# Patient Record
Sex: Female | Born: 1945 | Race: White | Hispanic: No | Smoking: Former smoker
Health system: Southern US, Community
[De-identification: ages and names within clinical notes are randomized; demographics above are authoritative.]

## PROBLEM LIST (undated history)

## (undated) DIAGNOSIS — R0600 Dyspnea, unspecified: Secondary | ICD-10-CM

## (undated) DIAGNOSIS — J449 Chronic obstructive pulmonary disease, unspecified: Secondary | ICD-10-CM

## (undated) DIAGNOSIS — Z87442 Personal history of urinary calculi: Secondary | ICD-10-CM

## (undated) DIAGNOSIS — D126 Benign neoplasm of colon, unspecified: Secondary | ICD-10-CM

## (undated) DIAGNOSIS — K635 Polyp of colon: Secondary | ICD-10-CM

## (undated) DIAGNOSIS — J45909 Unspecified asthma, uncomplicated: Secondary | ICD-10-CM

## (undated) DIAGNOSIS — N189 Chronic kidney disease, unspecified: Secondary | ICD-10-CM

## (undated) DIAGNOSIS — R931 Abnormal findings on diagnostic imaging of heart and coronary circulation: Secondary | ICD-10-CM

## (undated) DIAGNOSIS — K222 Esophageal obstruction: Secondary | ICD-10-CM

## (undated) DIAGNOSIS — K5790 Diverticulosis of intestine, part unspecified, without perforation or abscess without bleeding: Secondary | ICD-10-CM

## (undated) DIAGNOSIS — H269 Unspecified cataract: Secondary | ICD-10-CM

## (undated) DIAGNOSIS — Z8489 Family history of other specified conditions: Secondary | ICD-10-CM

## (undated) DIAGNOSIS — K219 Gastro-esophageal reflux disease without esophagitis: Secondary | ICD-10-CM

## (undated) DIAGNOSIS — M1990 Unspecified osteoarthritis, unspecified site: Secondary | ICD-10-CM

## (undated) DIAGNOSIS — K449 Diaphragmatic hernia without obstruction or gangrene: Secondary | ICD-10-CM

## (undated) DIAGNOSIS — K589 Irritable bowel syndrome without diarrhea: Secondary | ICD-10-CM

## (undated) DIAGNOSIS — T8859XA Other complications of anesthesia, initial encounter: Secondary | ICD-10-CM

## (undated) DIAGNOSIS — E079 Disorder of thyroid, unspecified: Secondary | ICD-10-CM

## (undated) DIAGNOSIS — Z9889 Other specified postprocedural states: Secondary | ICD-10-CM

## (undated) DIAGNOSIS — K649 Unspecified hemorrhoids: Secondary | ICD-10-CM

## (undated) DIAGNOSIS — D649 Anemia, unspecified: Secondary | ICD-10-CM

## (undated) DIAGNOSIS — R112 Nausea with vomiting, unspecified: Secondary | ICD-10-CM

## (undated) DIAGNOSIS — I6529 Occlusion and stenosis of unspecified carotid artery: Secondary | ICD-10-CM

## (undated) DIAGNOSIS — I639 Cerebral infarction, unspecified: Secondary | ICD-10-CM

## (undated) DIAGNOSIS — E039 Hypothyroidism, unspecified: Secondary | ICD-10-CM

## (undated) DIAGNOSIS — I10 Essential (primary) hypertension: Secondary | ICD-10-CM

## (undated) DIAGNOSIS — T7840XA Allergy, unspecified, initial encounter: Secondary | ICD-10-CM

## (undated) DIAGNOSIS — E7800 Pure hypercholesterolemia, unspecified: Secondary | ICD-10-CM

## (undated) HISTORY — DX: Unspecified hemorrhoids: K64.9

## (undated) HISTORY — DX: Unspecified cataract: H26.9

## (undated) HISTORY — DX: Irritable bowel syndrome, unspecified: K58.9

## (undated) HISTORY — DX: Anemia, unspecified: D64.9

## (undated) HISTORY — PX: PARTIAL HYSTERECTOMY: SHX80

## (undated) HISTORY — DX: Essential (primary) hypertension: I10

## (undated) HISTORY — DX: Gastro-esophageal reflux disease without esophagitis: K21.9

## (undated) HISTORY — DX: Abnormal findings on diagnostic imaging of heart and coronary circulation: R93.1

## (undated) HISTORY — DX: Unspecified osteoarthritis, unspecified site: M19.90

## (undated) HISTORY — PX: CATARACT EXTRACTION, BILATERAL: SHX1313

## (undated) HISTORY — DX: Cerebral infarction, unspecified: I63.9

## (undated) HISTORY — PX: RHINOPLASTY: SUR1284

## (undated) HISTORY — DX: Occlusion and stenosis of unspecified carotid artery: I65.29

## (undated) HISTORY — DX: Benign neoplasm of colon, unspecified: D12.6

## (undated) HISTORY — DX: Allergy, unspecified, initial encounter: T78.40XA

## (undated) HISTORY — DX: Diverticulosis of intestine, part unspecified, without perforation or abscess without bleeding: K57.90

## (undated) HISTORY — DX: Esophageal obstruction: K22.2

## (undated) HISTORY — DX: Diaphragmatic hernia without obstruction or gangrene: K44.9

## (undated) HISTORY — PX: OTHER SURGICAL HISTORY: SHX169

## (undated) HISTORY — PX: ABDOMINAL HYSTERECTOMY: SHX81

## (undated) HISTORY — DX: Polyp of colon: K63.5

## (undated) HISTORY — DX: Hypothyroidism, unspecified: E03.9

## (undated) HISTORY — DX: Chronic kidney disease, unspecified: N18.9

## (undated) HISTORY — DX: Chronic obstructive pulmonary disease, unspecified: J44.9

---

## 1978-11-16 HISTORY — PX: CHOLECYSTECTOMY: SHX55

## 1999-04-09 ENCOUNTER — Other Ambulatory Visit: Admission: RE | Admit: 1999-04-09 | Discharge: 1999-04-09 | Payer: Self-pay | Admitting: Internal Medicine

## 1999-05-16 ENCOUNTER — Ambulatory Visit (HOSPITAL_COMMUNITY): Admission: RE | Admit: 1999-05-16 | Discharge: 1999-05-16 | Payer: Self-pay | Admitting: Internal Medicine

## 1999-05-16 ENCOUNTER — Encounter: Payer: Self-pay | Admitting: Internal Medicine

## 2000-10-12 ENCOUNTER — Ambulatory Visit (HOSPITAL_COMMUNITY): Admission: RE | Admit: 2000-10-12 | Discharge: 2000-10-12 | Payer: Self-pay | Admitting: Critical Care Medicine

## 2000-10-12 ENCOUNTER — Encounter: Payer: Self-pay | Admitting: Critical Care Medicine

## 2001-09-27 ENCOUNTER — Encounter: Admission: RE | Admit: 2001-09-27 | Discharge: 2001-09-27 | Payer: Self-pay | Admitting: *Deleted

## 2001-09-27 ENCOUNTER — Encounter: Payer: Self-pay | Admitting: *Deleted

## 2001-09-29 ENCOUNTER — Ambulatory Visit (HOSPITAL_BASED_OUTPATIENT_CLINIC_OR_DEPARTMENT_OTHER): Admission: RE | Admit: 2001-09-29 | Discharge: 2001-09-30 | Payer: Self-pay | Admitting: *Deleted

## 2001-12-01 ENCOUNTER — Encounter: Admission: RE | Admit: 2001-12-01 | Discharge: 2001-12-01 | Payer: Self-pay

## 2002-01-18 ENCOUNTER — Other Ambulatory Visit: Admission: RE | Admit: 2002-01-18 | Discharge: 2002-01-18 | Payer: Self-pay | Admitting: Obstetrics & Gynecology

## 2004-01-29 ENCOUNTER — Other Ambulatory Visit: Admission: RE | Admit: 2004-01-29 | Discharge: 2004-01-29 | Payer: Self-pay | Admitting: *Deleted

## 2004-02-01 ENCOUNTER — Encounter: Admission: RE | Admit: 2004-02-01 | Discharge: 2004-02-01 | Payer: Self-pay | Admitting: Family Medicine

## 2004-02-13 ENCOUNTER — Encounter: Admission: RE | Admit: 2004-02-13 | Discharge: 2004-05-13 | Payer: Self-pay | Admitting: Family Medicine

## 2007-12-09 ENCOUNTER — Encounter: Admission: RE | Admit: 2007-12-09 | Discharge: 2007-12-09 | Payer: Self-pay | Admitting: Gastroenterology

## 2010-06-04 ENCOUNTER — Encounter: Admission: RE | Admit: 2010-06-04 | Discharge: 2010-06-04 | Payer: Self-pay | Admitting: Allergy and Immunology

## 2011-01-02 ENCOUNTER — Emergency Department (HOSPITAL_COMMUNITY)
Admission: EM | Admit: 2011-01-02 | Discharge: 2011-01-02 | Disposition: A | Discharge status: Home / Self Care | Payer: Medicare Other | Attending: Emergency Medicine | Admitting: Emergency Medicine

## 2011-01-02 DIAGNOSIS — M79609 Pain in unspecified limb: Secondary | ICD-10-CM | POA: Insufficient documentation

## 2011-01-02 DIAGNOSIS — E7800 Pure hypercholesterolemia, unspecified: Secondary | ICD-10-CM | POA: Insufficient documentation

## 2011-01-02 DIAGNOSIS — E119 Type 2 diabetes mellitus without complications: Secondary | ICD-10-CM | POA: Insufficient documentation

## 2011-01-02 DIAGNOSIS — R61 Generalized hyperhidrosis: Secondary | ICD-10-CM | POA: Insufficient documentation

## 2011-01-02 DIAGNOSIS — R0989 Other specified symptoms and signs involving the circulatory and respiratory systems: Secondary | ICD-10-CM | POA: Insufficient documentation

## 2011-01-02 DIAGNOSIS — R0609 Other forms of dyspnea: Secondary | ICD-10-CM | POA: Insufficient documentation

## 2011-01-02 DIAGNOSIS — I10 Essential (primary) hypertension: Secondary | ICD-10-CM | POA: Insufficient documentation

## 2011-01-02 DIAGNOSIS — R0789 Other chest pain: Secondary | ICD-10-CM | POA: Insufficient documentation

## 2011-01-02 DIAGNOSIS — Z79899 Other long term (current) drug therapy: Secondary | ICD-10-CM | POA: Insufficient documentation

## 2011-01-02 LAB — COMPREHENSIVE METABOLIC PANEL
Albumin: 4.1 g/dL (ref 3.5–5.2)
Alkaline Phosphatase: 56 U/L (ref 39–117)
BUN: 12 mg/dL (ref 6–23)
Chloride: 105 mEq/L (ref 96–112)
Glucose, Bld: 271 mg/dL — ABNORMAL HIGH (ref 70–99)
Potassium: 3.6 mEq/L (ref 3.5–5.1)
Total Bilirubin: 0.6 mg/dL (ref 0.3–1.2)

## 2011-01-02 LAB — POCT CARDIAC MARKERS: Myoglobin, poc: 68.7 ng/mL (ref 12–200)

## 2011-01-02 LAB — CBC
HCT: 39 % (ref 36.0–46.0)
Hemoglobin: 13.6 g/dL (ref 12.0–15.0)
MCV: 86.7 fL (ref 78.0–100.0)
RBC: 4.5 MIL/uL (ref 3.87–5.11)
WBC: 6.4 10*3/uL (ref 4.0–10.5)

## 2011-04-16 ENCOUNTER — Other Ambulatory Visit: Payer: Self-pay | Admitting: Obstetrics and Gynecology

## 2012-06-14 ENCOUNTER — Emergency Department (HOSPITAL_COMMUNITY): Payer: Medicare Other

## 2012-06-14 ENCOUNTER — Emergency Department (HOSPITAL_COMMUNITY)
Admission: EM | Admit: 2012-06-14 | Discharge: 2012-06-14 | Disposition: A | Discharge status: Home / Self Care | Payer: Medicare Other | Attending: Emergency Medicine | Admitting: Emergency Medicine

## 2012-06-14 ENCOUNTER — Encounter (HOSPITAL_COMMUNITY): Payer: Self-pay

## 2012-06-14 DIAGNOSIS — Z043 Encounter for examination and observation following other accident: Secondary | ICD-10-CM | POA: Insufficient documentation

## 2012-06-14 DIAGNOSIS — R079 Chest pain, unspecified: Secondary | ICD-10-CM

## 2012-06-14 DIAGNOSIS — E7800 Pure hypercholesterolemia, unspecified: Secondary | ICD-10-CM | POA: Insufficient documentation

## 2012-06-14 DIAGNOSIS — J45909 Unspecified asthma, uncomplicated: Secondary | ICD-10-CM | POA: Insufficient documentation

## 2012-06-14 DIAGNOSIS — E079 Disorder of thyroid, unspecified: Secondary | ICD-10-CM | POA: Insufficient documentation

## 2012-06-14 HISTORY — DX: Pure hypercholesterolemia, unspecified: E78.00

## 2012-06-14 HISTORY — DX: Disorder of thyroid, unspecified: E07.9

## 2012-06-14 HISTORY — DX: Unspecified asthma, uncomplicated: J45.909

## 2012-06-14 NOTE — ED Notes (Signed)
Pt arrived via [facility] on [facility] with c-collar intact 
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
## 2012-06-15 NOTE — ED Provider Notes (Signed)
I saw and evaluated the patient, reviewed the resident's note and I agree with the findings and plan.   Dione Booze, MD 06/15/12 2312

## 2012-10-16 HISTORY — PX: BUNIONECTOMY: SHX129

## 2013-03-21 ENCOUNTER — Other Ambulatory Visit: Payer: Self-pay | Admitting: Gastroenterology

## 2013-04-12 ENCOUNTER — Other Ambulatory Visit: Payer: Self-pay | Admitting: Gastroenterology

## 2013-04-12 DIAGNOSIS — R1310 Dysphagia, unspecified: Secondary | ICD-10-CM

## 2013-05-03 ENCOUNTER — Other Ambulatory Visit: Payer: Medicare Other

## 2014-06-01 ENCOUNTER — Ambulatory Visit (INDEPENDENT_AMBULATORY_CARE_PROVIDER_SITE_OTHER): Payer: 59 | Admitting: Podiatrist

## 2014-06-01 ENCOUNTER — Encounter: Payer: Self-pay | Admitting: Podiatrist

## 2014-06-01 ENCOUNTER — Ambulatory Visit (INDEPENDENT_AMBULATORY_CARE_PROVIDER_SITE_OTHER): Payer: 59

## 2014-06-01 VITALS — BP 124/58 | HR 88 | Resp 18

## 2014-06-01 DIAGNOSIS — S92919A Unspecified fracture of unspecified toe(s), initial encounter for closed fracture: Secondary | ICD-10-CM

## 2014-06-01 DIAGNOSIS — R52 Pain, unspecified: Secondary | ICD-10-CM

## 2014-06-01 DIAGNOSIS — S90222A Contusion of left lesser toe(s) with damage to nail, initial encounter: Secondary | ICD-10-CM

## 2014-06-01 DIAGNOSIS — L03039 Cellulitis of unspecified toe: Secondary | ICD-10-CM

## 2014-06-01 DIAGNOSIS — S92912A Unspecified fracture of left toe(s), initial encounter for closed fracture: Secondary | ICD-10-CM

## 2014-06-01 DIAGNOSIS — S90129A Contusion of unspecified lesser toe(s) without damage to nail, initial encounter: Secondary | ICD-10-CM

## 2014-06-01 NOTE — Patient Instructions (Signed)
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
## 2014-11-26 ENCOUNTER — Ambulatory Visit (INDEPENDENT_AMBULATORY_CARE_PROVIDER_SITE_OTHER): Payer: Medicare Other

## 2014-11-26 ENCOUNTER — Encounter: Payer: Self-pay | Admitting: Podiatry

## 2014-11-26 ENCOUNTER — Ambulatory Visit (INDEPENDENT_AMBULATORY_CARE_PROVIDER_SITE_OTHER): Payer: Medicare Other | Admitting: Podiatry

## 2014-11-26 ENCOUNTER — Other Ambulatory Visit: Payer: Self-pay | Admitting: Podiatry

## 2014-11-26 VITALS — BP 153/78 | HR 80 | Resp 14

## 2014-11-26 DIAGNOSIS — S99922A Unspecified injury of left foot, initial encounter: Secondary | ICD-10-CM

## 2014-11-26 DIAGNOSIS — S99912A Unspecified injury of left ankle, initial encounter: Secondary | ICD-10-CM

## 2014-11-26 DIAGNOSIS — S92352A Displaced fracture of fifth metatarsal bone, left foot, initial encounter for closed fracture: Secondary | ICD-10-CM

## 2014-11-26 NOTE — Progress Notes (Signed)
I was walking on an uneven sidewalk I stepped in a dip and turned my foot over trying not to fall  DOI 11/17/14

## 2014-11-26 NOTE — Patient Instructions (Signed)
Wear the boot on your left leg when walking and standing Remove boot 2-3 times a day in a seated position and move foot up and down 2-3 minutes

## 2014-11-27 ENCOUNTER — Encounter: Payer: Self-pay | Admitting: Podiatry
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
## 2014-12-24 ENCOUNTER — Ambulatory Visit (INDEPENDENT_AMBULATORY_CARE_PROVIDER_SITE_OTHER): Payer: Medicare Other

## 2014-12-24 ENCOUNTER — Ambulatory Visit (INDEPENDENT_AMBULATORY_CARE_PROVIDER_SITE_OTHER): Payer: Medicare Other | Admitting: Podiatry

## 2014-12-24 ENCOUNTER — Encounter: Payer: Self-pay | Admitting: Podiatry

## 2014-12-24 VITALS — BP 119/68 | HR 79 | Resp 16

## 2014-12-24 DIAGNOSIS — B351 Tinea unguium: Secondary | ICD-10-CM

## 2014-12-24 DIAGNOSIS — S92352D Displaced fracture of fifth metatarsal bone, left foot, subsequent encounter for fracture with routine healing: Secondary | ICD-10-CM

## 2014-12-24 NOTE — Patient Instructions (Signed)
Continue wearing boot on left foot/ankle when walking and standing

## 2014-12-25 NOTE — Progress Notes (Signed)
Patient ID: Kelly Mooney, female   DOB: 1946-04-27, 69 y.o.   MRN: [id number]  Subjective: This patient presents for follow-up care for Jones fracture left with Cam Walker immobilization since the visit of 11/26/2014. She says she wearing the boot on a regular basis and is having reducing discomfort in the fracture site  Objective: There is no erythema edema noted in the dorsal lateral aspect the fifth left foot. There is palpable tenderness and discomfort without crepitus elicited in the base of fifth left metatarsal area.  X-ray examination left foot  History of injury on 11/17/2014  Patient wearing Cam Walker boot since 11/27/2014  Fracture line noticed base of fifth metatarsal without bone callus or displacement  Radiographic impression: Jones fracture left foot without callus formation noted   Assessment: Jones fracture left without radiographic evidence of callus formation or consolidation  Plan: Continue wearing Cam Walker boot on left foot Discuss with patient findings of x-ray today make him aware that the fracture line was still visible.  Reappoint 4 weeks for progress x-ray

## 2015-01-21 ENCOUNTER — Encounter: Payer: Self-pay | Admitting: Podiatry

## 2015-01-21 ENCOUNTER — Ambulatory Visit (INDEPENDENT_AMBULATORY_CARE_PROVIDER_SITE_OTHER): Payer: Medicare Other

## 2015-01-21 ENCOUNTER — Ambulatory Visit (INDEPENDENT_AMBULATORY_CARE_PROVIDER_SITE_OTHER): Payer: Medicare Other | Admitting: Podiatry

## 2015-01-21 VITALS — BP 138/73 | HR 84

## 2015-01-21 DIAGNOSIS — M79672 Pain in left foot: Secondary | ICD-10-CM

## 2015-01-21 DIAGNOSIS — S92352D Displaced fracture of fifth metatarsal bone, left foot, subsequent encounter for fracture with routine healing: Secondary | ICD-10-CM

## 2015-01-21 NOTE — Patient Instructions (Signed)
Discontinue wearing the boot on the left foot/ankle unless the pain increases in or around the fracture site Where your existing surgical shoe on your left foot on a continuous daily basis when walking and standing Reappoint 4 weeks

## 2015-01-22 NOTE — Progress Notes (Signed)
Patient ID: Kelly Mooney, female   DOB: Feb 10, 1946, 69 y.o.   MRN: [id number]  Subjective: Patient presents for follow-up care for Jones fracture left foot under our care since 11/26/2014, wearing a Cam Walker boot on the left leg/foot. Patient has noticed no discomfort in fracture site for the past 2 weeks. Patient complaining of back/ hip pain since she's wearing the Cam Walker boot and visited chiropractor for treatment  There is no change in patient's health history other than back/hip pain as described above  Objective: Left foot demonstrates no edema or skin lesions Palpation on fracture site base of fifth left metatarsal elicits no tenderness, without crepitus,  X-ray examination left foot  History of Jones fracture occurring approximately 11/26/2014 Fracture base fifth left metatarsal with some beginning consolidation, without displacement  Radiographic impression: Beginning consolidation of Jones fracture left foot  Assessment: Beginning consolidation of Jones fracture left  Plan: DC Cam Walker boot left Patient advised to wear existing surgical shoe on left foot Minimize standing walking left  Reevaluate 4 weeks

## 2015-02-07 ENCOUNTER — Telehealth: Payer: Self-pay | Admitting: *Deleted

## 2015-02-07 ENCOUNTER — Encounter: Payer: Self-pay | Admitting: Podiatry

## 2015-02-07 NOTE — Telephone Encounter (Addendum)
I left patient a message to return my call.  I need to inform her that her that Dr. Amalia Hailey said culture results came back negative for fungus.  If she wants something to treat it, can prescribe Urea.  "I'm returning your call."

## 2015-02-14 NOTE — Telephone Encounter (Signed)
I left patient a message to call me back.  Work phone number is incorrect, patient is no longer employed there.

## 2015-02-18 ENCOUNTER — Ambulatory Visit: Payer: Medicare Other | Admitting: Podiatry

## 2015-04-09 ENCOUNTER — Other Ambulatory Visit: Payer: Self-pay

## 2015-04-09 ENCOUNTER — Telehealth: Payer: Self-pay | Admitting: Internal Medicine

## 2015-04-09 NOTE — Telephone Encounter (Signed)
Pt states she has been having problems with diarrhea and IBS for years and she is tired of this and wants to be seen to find out what can be done. Pt scheduled to see Dr. Henrene Pastor 06/12/15@1 :45pm. Pt aware of appt.

## 2015-04-25 ENCOUNTER — Other Ambulatory Visit: Payer: Self-pay | Admitting: Allergy and Immunology

## 2015-04-25 ENCOUNTER — Ambulatory Visit
Admission: RE | Admit: 2015-04-25 | Discharge: 2015-04-25 | Disposition: A | Discharge status: Home / Self Care | Payer: Medicare Other | Source: Ambulatory Visit | Attending: Allergy and Immunology | Admitting: Allergy and Immunology

## 2015-04-25 DIAGNOSIS — R0602 Shortness of breath: Secondary | ICD-10-CM

## 2015-06-12 ENCOUNTER — Ambulatory Visit (INDEPENDENT_AMBULATORY_CARE_PROVIDER_SITE_OTHER): Payer: Medicare Other | Admitting: Internal Medicine

## 2015-06-12 ENCOUNTER — Encounter: Payer: Self-pay | Admitting: Internal Medicine

## 2015-06-12 VITALS — BP 122/70 | HR 72 | Ht 61.0 in | Wt 169.0 lb

## 2015-06-12 DIAGNOSIS — K219 Gastro-esophageal reflux disease without esophagitis: Secondary | ICD-10-CM | POA: Diagnosis not present

## 2015-06-12 DIAGNOSIS — R197 Diarrhea, unspecified: Secondary | ICD-10-CM

## 2015-06-12 DIAGNOSIS — K449 Diaphragmatic hernia without obstruction or gangrene: Secondary | ICD-10-CM

## 2015-06-12 DIAGNOSIS — R1310 Dysphagia, unspecified: Secondary | ICD-10-CM

## 2015-06-12 DIAGNOSIS — R159 Full incontinence of feces: Secondary | ICD-10-CM

## 2015-06-12 MED ORDER — CHOLESTYRAMINE 4 G PO PACK: PACK | ORAL | Status: DC

## 2015-06-12 MED ORDER — OMEPRAZOLE 40 MG PO CPDR: 40.0000 mg | DELAYED_RELEASE_CAPSULE | Freq: Every day | ORAL | Status: DC

## 2015-06-12 NOTE — Progress Notes (Signed)
HISTORY OF PRESENT ILLNESS:  Kelly Mooney is a pleasant 69 y.o. female with diabetes mellitus, hyperlipidemia, hypothyroidism, asthma, and GERD. She is status post remote cholecystectomy. She is self-referred at the urging of her husband, who accompanies her, regarding multiple chronic GI complaints. The patient was seen here many years ago (records unavailable) but has received recent GI care elsewhere from Dr. Laurence Spates. They presented to this office today being unsatisfied with the management of her chronic GI complaints. Her problems are as follows: (#1). Diarrhea. Greater than 10 year history of daily diarrhea which has increased in frequency in recent years. Her problem is exacerbated by meals. Generally has 7-8 loose bowel movements per day. Rarely has a formed bowel movement. Bowel movements generally sink, but she has noticed oily appearance at times. No blood. No nocturnal component. No change with stress. There is associated bloating. She has tried Imodium about once per month. This helps. She is reluctant to use it more regularly. Review of the electronic medical record shows that she underwent random colon biopsies (during colonoscopy with Dr. Oletta Lamas) 03/21/2013. These were normal. As well, normal small bowel biopsies without villous atrophy (upper endoscopy with Dr. Oletta Lamas) on that same day. She denies being given a specific diagnosis for her diarrhea. States that she was only treated with "a little pill" which did not work and was not tolerated. She had been on metformin until about 9 months ago. After its discontinuance, no change in bowel habits. She is also on amlodipine. (#2). Fecal incontinence. This has occurred with increasing frequency over time. Currently on a daily basis. Generally occurs after meal-induced urgency to defecate. She does not wear protective undergarments. (#3). Dysphagia. Solids greater than liquids. Has been going on for greater than 10 years. Apparently had  esophageal dilation remotely without improvement. Review of electronic medical record shows a barium swallow performed January 2009. She is said to have a moderately large hiatal hernia with sliding and paraesophageal components as well as reflux. No obvious stricture or delay in passage of 13 mm barium tablet. The patient states she was not advised with regards to these findings. Problems with dysphagia continue. (#4). GERD. Patient reports chronic GERD with pyrosis. Worse at night. This despite omeprazole 20 mg daily. [employer] daily. There has been no weight loss, melena, or rectal bleeding.  REVIEW OF SYSTEMS:  All non-GI ROS negative upon review of all systems today  Past Medical History  Diagnosis Date  . Diabetes mellitus   . Hypercholesteremia   . Asthma   . Thyroid disease   . GERD (gastroesophageal reflux disease)     Past Surgical History  Procedure Laterality Date  . Rhinoplasty      nasal polyps removed dr. Barrie Folk  . Cholecystectomy  1980  . Partail hysterectomy    . 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 She has never used smokeless tobacco. She reports that she does not drink alcohol or use illicit drugs.  family history includes Asthma in her brother and mother; Diabetes Mellitus I in her mother; Heart attack in her brother and father; Hyperlipidemia in her mother.  Allergies  Allergen Reactions  . Iodine Itching  . Ivp Dye [Iodinated Diagnostic Agents] Other (See Comments)    unknown       PHYSICAL EXAMINATION: Vital signs: BP 122/70 mmHg  Pulse 72  Ht 5\' 1"  (1.549 m)  Wt 169 lb (  76.658 kg)  BMI 31.95 kg/m2  Constitutional: generally well-appearing, no acute distress Psychiatric: alert and oriented x3, cooperative Eyes: extraocular movements intact, anicteric, conjunctiva pink Mouth: oral pharynx moist, no lesions Neck: supple no lymphadenopathy Cardiovascular: heart regular rate  and rhythm, no murmur Lungs: clear to auscultation bilaterally Abdomen: soft, obese, nontender, nondistended, no obvious ascites, no peritoneal signs, normal bowel sounds, no organomegaly Rectal: Sensation intact. No external abnormalities. Internal hemorrhoids. Good sphincter tone. No mass or significant tenderness. Hemoccult-negative stool Extremities: no clubbing cyanosis or lower extremity edema bilaterally Skin: no lesions on visible extremities Neuro: No focal deficits. No asterixis.    ASSESSMENT:  #1. Chronic diarrhea. Possible etiologies include IBS, bile salt induced, pancreatic insufficiency. #2. Fecal incontinence. Good rectal tone #3. GERD. Significant symptoms despite current dose of omeprazole. #4. Chronic dysphagia as described. May be motility disorder or mechanical and related to her hernia. Needs assessed #5. 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 Should be reassessed #6. Multiple general medical problems including diabetes  PLAN:  #1. Increased dose of omeprazole. Prescribed omeprazole 40 mg daily with multiple refills #2. Reflux precautions. Advised #3. Fecal elastase #4. Prescribe Questran 4 g twice a day. Advised not to take within 2 hours of other medications #5. Okay to use Imodium more liberally #6. Schedule upper GI series to reevaluate dysphagia and paraesophageal hernia #7. Schedule esophageal manometry as this patient may benefit from hernia repair and fundoplication #8. Office follow-up in 4-6 weeks after the above completed #9. Have requested outside GI records for review and incorporation into her medical record here at [facility]   A copy has been sent to her PCP Dr. Harrington Challenger

## 2015-06-12 NOTE — Patient Instructions (Addendum)
Your physician has requested that you go to the basement for the following lab work before leaving today:  Fecal elastace  We have sent the following medications to your pharmacy for you to pick up at your convenience:  Omeprazole 40mg , Questran   Please follow up with Dr. Henrene Pastor on 07/30/2015 at 9:15am  You have been scheduled for an esophageal manometry on 07/15/2015 at 8;30am

## 2015-06-13 ENCOUNTER — Other Ambulatory Visit: Payer: Medicare Other

## 2015-06-13 DIAGNOSIS — K219 Gastro-esophageal reflux disease without esophagitis: Secondary | ICD-10-CM

## 2015-06-13 DIAGNOSIS — R159 Full incontinence of feces: Secondary | ICD-10-CM

## 2015-06-13 DIAGNOSIS — R1310 Dysphagia, unspecified: Secondary | ICD-10-CM

## 2015-06-13 DIAGNOSIS — R197 Diarrhea, unspecified: Secondary | ICD-10-CM

## 2015-06-17 ENCOUNTER — Telehealth: Payer: Self-pay

## 2015-06-17 DIAGNOSIS — R1310 Dysphagia, unspecified: Secondary | ICD-10-CM

## 2015-06-17 NOTE — Telephone Encounter (Signed)
Left message for patient to call back to schedule upper GI series ordered by Dr. Henrene Pastor

## 2015-06-18 NOTE — Telephone Encounter (Signed)
Spoke with patient to let her know Dr. Henrene Pastor wanted her to have an upper GI series.  Scheduled this test for 06/25/2015 at  10:00am, arrive 9:45am, npo hours.  Patient acknowledged and understood

## 2015-06-22 LAB — PANCREATIC ELASTASE, FECAL: PANCREATIC ELASTASE-1, STL: 415 ug/g

## 2015-06-25 ENCOUNTER — Ambulatory Visit (HOSPITAL_COMMUNITY)
Admission: RE | Admit: 2015-06-25 | Discharge: 2015-06-25 | Disposition: A | Discharge status: Home / Self Care | Payer: Medicare Other | Source: Ambulatory Visit | Attending: Internal Medicine | Admitting: Internal Medicine

## 2015-06-25 DIAGNOSIS — K224 Dyskinesia of esophagus: Secondary | ICD-10-CM | POA: Diagnosis not present

## 2015-06-25 DIAGNOSIS — R1310 Dysphagia, unspecified: Secondary | ICD-10-CM

## 2015-06-25 DIAGNOSIS — K219 Gastro-esophageal reflux disease without esophagitis: Secondary | ICD-10-CM | POA: Insufficient documentation

## 2015-06-25 DIAGNOSIS — K449 Diaphragmatic hernia without obstruction or gangrene: Secondary | ICD-10-CM | POA: Insufficient documentation

## 2015-06-25 DIAGNOSIS — K222 Esophageal obstruction: Secondary | ICD-10-CM | POA: Insufficient documentation

## 2015-07-15 ENCOUNTER — Encounter (HOSPITAL_COMMUNITY)
Admission: RE | Disposition: A | Discharge status: Home / Self Care | Payer: Self-pay | Source: Ambulatory Visit | Attending: Internal Medicine

## 2015-07-15 ENCOUNTER — Ambulatory Visit (HOSPITAL_COMMUNITY)
Admission: RE | Admit: 2015-07-15 | Discharge: 2015-07-15 | Disposition: A | Discharge status: Home / Self Care | Payer: Medicare Other | Source: Ambulatory Visit | Attending: Internal Medicine | Admitting: Internal Medicine

## 2015-07-15 DIAGNOSIS — R1310 Dysphagia, unspecified: Secondary | ICD-10-CM | POA: Diagnosis not present

## 2015-07-15 HISTORY — PX: ESOPHAGEAL MANOMETRY: SHX5429

## 2015-07-15 SURGERY — MANOMETRY, ESOPHAGUS

## 2015-07-15 MED ORDER — LIDOCAINE VISCOUS 2 % MT SOLN
OROMUCOSAL | Status: AC
  Filled 2015-07-15: qty 15

## 2015-07-15 SURGICAL SUPPLY — 2 items
FACESHIELD LNG OPTICON STERILE (SAFETY) IMPLANT
GLOVE BIO SURGEON STRL SZ8 (GLOVE) ×4 IMPLANT

## 2015-07-16 ENCOUNTER — Encounter (HOSPITAL_COMMUNITY): Payer: Self-pay | Admitting: Internal Medicine

## 2015-07-19 ENCOUNTER — Ambulatory Visit (AMBULATORY_SURGERY_CENTER): Payer: Self-pay

## 2015-07-19 VITALS — Ht 63.0 in | Wt 171.8 lb

## 2015-07-19 DIAGNOSIS — R1314 Dysphagia, pharyngoesophageal phase: Secondary | ICD-10-CM

## 2015-07-19 NOTE — Progress Notes (Signed)
No allergies to eggs or soy No diet/weight loss meds No home oxygen No past problems with anesthesia except PONV  Refused  emmi

## 2015-07-25 ENCOUNTER — Encounter: Payer: Self-pay | Admitting: Internal Medicine

## 2015-07-29 ENCOUNTER — Encounter: Payer: Self-pay | Admitting: Internal Medicine

## 2015-07-29 ENCOUNTER — Ambulatory Visit (AMBULATORY_SURGERY_CENTER): Payer: Medicare Other | Admitting: Internal Medicine

## 2015-07-29 VITALS — BP 110/66 | HR 73 | Temp 97.5°F | Resp 15 | Ht 63.0 in | Wt 171.0 lb

## 2015-07-29 DIAGNOSIS — R1314 Dysphagia, pharyngoesophageal phase: Secondary | ICD-10-CM | POA: Diagnosis not present

## 2015-07-29 DIAGNOSIS — R1310 Dysphagia, unspecified: Secondary | ICD-10-CM

## 2015-07-29 DIAGNOSIS — K222 Esophageal obstruction: Secondary | ICD-10-CM | POA: Diagnosis not present

## 2015-07-29 DIAGNOSIS — K219 Gastro-esophageal reflux disease without esophagitis: Secondary | ICD-10-CM

## 2015-07-29 DIAGNOSIS — R1319 Other dysphagia: Secondary | ICD-10-CM

## 2015-07-29 LAB — GLUCOSE, CAPILLARY
GLUCOSE-CAPILLARY: 118 mg/dL — AB (ref 65–99)
Glucose-Capillary: 94 mg/dL (ref 65–99)

## 2015-07-29 MED ORDER — SODIUM CHLORIDE 0.9 % IV SOLN: 500.0000 mL | INTRAVENOUS | Status: DC

## 2015-07-29 NOTE — Patient Instructions (Signed)
Impressions/recommendations:  GERD (handout given) Hiatal hernia (handout given)  Dilation diet (handout given)  Continue current medications Call office in the next 2-3 days to schedule an appointment for 10 weeks.  YOU HAD AN ENDOSCOPIC PROCEDURE TODAY AT [facility]:   Refer to the procedure report that was given to you for any specific questions about what was found during the examination.  If the procedure report does not answer your questions, please call your gastroenterologist to clarify.  If you requested that your care partner not be given the details of your procedure findings, then the procedure report has been included in a sealed envelope for you to review at your convenience later.  YOU SHOULD EXPECT: Some feelings of bloating in the abdomen. Passage of more gas than usual.  Walking can help get rid of the air that was put into your GI tract during the procedure and reduce the bloating. If you had a lower endoscopy (such as a colonoscopy or flexible sigmoidoscopy) you may notice spotting of blood in your stool or on the toilet paper. If you underwent a bowel prep for your procedure, you may not have a normal bowel movement for a few days.  Please Note:  You might notice some irritation and congestion in your nose or some drainage.  This is from the oxygen used during your procedure.  There is no need for concern and it should clear up in a day or so.  SYMPTOMS TO REPORT IMMEDIATELY:   Following upper endoscopy (EGD)  Vomiting of blood or coffee ground material  New chest pain or pain under the shoulder blades  Painful or persistently difficult swallowing  New shortness of breath  Fever of 100F or higher  Black, tarry-looking stools  For urgent or emergent issues, a gastroenterologist can be reached at any hour by calling [phone].   DIET: Your first meal following the procedure should be a small meal and then it is ok to progress to your normal  diet. Heavy or fried foods are harder to digest and may make you feel nauseous or bloated.  Likewise, meals heavy in dairy and vegetables can increase bloating.  Drink plenty of fluids but you should avoid alcoholic beverages for 24 hours.  ACTIVITY:  You should plan to take it easy for the rest of today and you should NOT DRIVE or use heavy machinery until tomorrow (because of the sedation medicines used during the test).    FOLLOW UP: Our staff will call the number listed on your records the next business day following your procedure to check on you and address any questions or concerns that you may have regarding the information given to you following your procedure. If we do not reach you, we will leave a message.  However, if you are feeling well and you are not experiencing any problems, there is no need to return our call.  We will assume that you have returned to your regular daily activities without incident.  If any biopsies were taken you will be contacted by phone or by letter within the next 1-3 weeks.  Please call us at [phone] if you have not heard about the biopsies in 3 weeks.    SIGNATURES/CONFIDENTIALITY: You and/or your care partner have signed paperwork which will be entered into your electronic medical record.  These signatures attest to the fact that that the information above on your After Visit Summary has been reviewed and is understood.  Full responsibility of  the confidentiality of this discharge information lies with you and/or your care-partner. 

## 2015-07-29 NOTE — Progress Notes (Signed)
Called to room to assist during endoscopic procedure.  Patient ID and intended procedure confirmed with present staff. Received instructions for my participation in the procedure from the performing physician.  

## 2015-07-29 NOTE — Op Note (Signed)
[place]  [address] [place], [id number]   ENDOSCOPY PROCEDURE REPORT  PATIENT: Magie, Ciampa  MR#: [id number] BIRTHDATE: March 05, 1946 , 69  yrs. old GENDER: female ENDOSCOPIST: Eustace Quail, MD REFERRED BY:  .  Self / Office PROCEDURE DATE:  07/29/2015 PROCEDURE:  EGD w/ balloon dilation ASA CLASS:     Class II INDICATIONS:  dysphagia and abnormal UGI series results. MEDICATIONS: Monitored anesthesia care and Propofol 200 mg IV TOPICAL ANESTHETIC: none  DESCRIPTION OF PROCEDURE: After the risks benefits and alternatives of the procedure were thoroughly explained, informed consent was obtained.  The LB [id number] endoscope was introduced through the mouth and advanced to the second portion of the duodenum , Without limitations.  The instrument was slowly withdrawn as the mucosa was fully examined.  EXAM:Esophagus was tortuous.  The gastroesophageal junction was remarkable for ringlike stricture measuring 15 mm.  Stomach was remarkable for a 4-5 cm sliding hiatal hernia.  The remainder of stomach was normal.  The duodenum was normal. THERAPY: A TTS balloon was used to sequentially dilate the esophageal stricture at diameters of 18, 19, and 20 mm.  Moderate resistance.  No heme.  Tolerated well.  Retroflexed views revealed a hiatal hernia.     The scope was then withdrawn from the patient and the procedure completed.  COMPLICATIONS: There were no immediate complications.  ENDOSCOPIC IMPRESSION: 1. GERD 2. Ringlike distal stricture status post dilation 3. Hiatal hernia with paraesophageal component on barium radiology   RECOMMENDATIONS: 1.  Clear liquids until 4 PM, then soft foods rest of day.  Resume prior diet tomorrow. 2.  Continue current medications 3.  Call office next 2-3 days to schedule an office appointment for a 10 weeks  REPEAT EXAM:  eSigned:  Eustace Quail, MD 07/29/2015 2:46 PM    CC:The Patient and Lona Kettle  MD

## 2015-07-29 NOTE — Progress Notes (Signed)
To recovery, report to [place], RN, VSS 

## 2015-07-30 ENCOUNTER — Ambulatory Visit: Payer: Medicare Other | Admitting: Internal Medicine

## 2015-07-30 NOTE — Telephone Encounter (Deleted)
No answer. Left message to call if questions or concerns. 

## 2015-09-05 ENCOUNTER — Other Ambulatory Visit: Payer: Medicare Other

## 2015-09-19 ENCOUNTER — Ambulatory Visit (INDEPENDENT_AMBULATORY_CARE_PROVIDER_SITE_OTHER): Payer: Medicare Other | Admitting: Internal Medicine

## 2015-09-19 ENCOUNTER — Encounter: Payer: Self-pay | Admitting: Internal Medicine

## 2015-09-19 VITALS — BP 124/64 | HR 60 | Ht 61.0 in | Wt 173.2 lb

## 2015-09-19 DIAGNOSIS — R159 Full incontinence of feces: Secondary | ICD-10-CM

## 2015-09-19 DIAGNOSIS — K591 Functional diarrhea: Secondary | ICD-10-CM

## 2015-09-19 DIAGNOSIS — K449 Diaphragmatic hernia without obstruction or gangrene: Secondary | ICD-10-CM

## 2015-09-19 DIAGNOSIS — K219 Gastro-esophageal reflux disease without esophagitis: Secondary | ICD-10-CM | POA: Diagnosis not present

## 2015-09-19 DIAGNOSIS — K222 Esophageal obstruction: Secondary | ICD-10-CM

## 2015-09-19 DIAGNOSIS — R1310 Dysphagia, unspecified: Secondary | ICD-10-CM

## 2015-09-19 NOTE — Patient Instructions (Signed)
Call back to schedule a follow up in one year or sooner if needed.

## 2015-09-19 NOTE — Progress Notes (Signed)
HISTORY OF PRESENT ILLNESS:  Kelly Mooney is a 69 y.o. female with past medical history as listed below. She was initially evaluated 06/12/2015 regarding chronic diarrhea, fecal incontinence, GERD with dysphagia, and para esophageal hernia on previous imaging. Please See Dictation for details. We increased her omeprazole from 20 mg to 40 mg. Fecal elastase was checked and returned normal. Questran 4 g twice daily prescribed, permitted to use Imodium more liberally, underwent upper GI series which revealed a moderate sliding hiatal hernia with a small paraesophageal component, dysmotility, and stricture. Upper endoscopy was performed 07/29/2015. This revealed a distal esophageal stricture which was balloon dilated to 20 mm. She presents today for follow-up. Patient is extremely pleased to report that most of her initial problems have resolved or improved. First, the chronic diarrhea has significantly improved with Questran. She is only requiring this proximal me 3 times per week. Not requiring Imodium. Very little in the way of fecal incontinence. Minimal leakage only. Her reflux symptoms have resolved with higher dose PPI. Her issues with dysphagia have improved "95%" since her dilation. We also performed esophageal manometry should antireflux surgery or hernia repair surgery be required. This revealed intact peristalsis with no significant motility disorder found. No additional complaints or issues today. As previously reported, her last colonoscopy with normal random colon biopsies was with Dr. Oletta Lamas in May 2014  REVIEW OF SYSTEMS:  All non-GI ROS negative except for sinus allergy trouble  Past Medical History  Diagnosis Date  . Diabetes mellitus   . Hypercholesteremia   . Asthma   . Thyroid disease   . GERD (gastroesophageal reflux disease)   . Esophageal stricture   . Hiatal hernia   . Diverticulosis   . Colon polyps     Past Surgical History  Procedure Laterality Date  . Rhinoplasty       nasal polyps removed dr. Barrie Folk  . Cholecystectomy  1980  . Partail hysterectomy    . Bunionectomy  10/2012    right foot  . Esophageal manometry N/A 07/15/2015    Procedure: ESOPHAGEAL MANOMETRY (EM);  Surgeon: Irene Shipper, MD;  Location: [facility];  Service: Endoscopy;  Laterality: N/A;    Social History SHONITA RINCK  reports that she has never smoked. She has never used smokeless tobacco. She reports that she does not drink alcohol or use illicit drugs.  family history includes Asthma in her brother and mother; Diabetes Mellitus I in her mother; Heart attack in her brother and father; Hyperlipidemia in her mother. There is no history of Colon cancer or Colon polyps.  Allergies  Allergen Reactions  . Iodine Itching  . Ivp Dye [Iodinated Diagnostic Agents] Other (See Comments)    unknown       PHYSICAL EXAMINATION:  Vital signs: BP 124/64 mmHg  Pulse 60  Ht 5\' 1"  (1.549 m)  Wt 173 lb 4 oz (78.586 kg)  BMI 32.75 kg/m2 General: Well-developed, well-nourished, no acute distress HEENT: Sclerae are anicteric, conjunctiva pink. Oral mucosa intact Lungs: Clear Heart: Regular Abdomen: soft, nontender, nondistended, no obvious ascites, no peritoneal signs, normal bowel sounds. No organomegaly. Extremities: No clubbing cyanosis or edema Psychiatric: alert and oriented x3. Cooperative  ASSESSMENT:  #1. Chronic GERD. Exacerbation responded to increased dose of omeprazole #2. Chronic diarrhea and fecal incontinence improved with Questran #3. Dysphagia improved post dilation of esophageal stricture #4. Paraesophageal hernia component small. Plan to observe #5. Colonoscopy with biopsies (negative) with Dr. Oletta Lamas May 2014  PLAN:  #1. Reflux precautions.  Advised #2. Continue omeprazole at current dose #3. Questran as needed to control diarrhea #4. Recommend routine GI follow-up in 1 year. Sooner if needed for questions or problems  25 minutes was spent face-to-face  with the patient today. Greater than 50% of the time was used for counseling regarding her multiple GI diagnoses, treatment plan, and follow-up

## 2015-11-23 ENCOUNTER — Other Ambulatory Visit: Payer: Self-pay | Admitting: Internal Medicine

## 2016-04-16 ENCOUNTER — Ambulatory Visit (INDEPENDENT_AMBULATORY_CARE_PROVIDER_SITE_OTHER): Payer: Medicare Other | Admitting: Internal Medicine

## 2016-04-16 ENCOUNTER — Encounter: Payer: Self-pay | Admitting: Internal Medicine

## 2016-04-16 VITALS — BP 136/76 | HR 80 | Ht 61.5 in | Wt 172.2 lb

## 2016-04-16 DIAGNOSIS — R1310 Dysphagia, unspecified: Secondary | ICD-10-CM

## 2016-04-16 DIAGNOSIS — K591 Functional diarrhea: Secondary | ICD-10-CM

## 2016-04-16 DIAGNOSIS — K219 Gastro-esophageal reflux disease without esophagitis: Secondary | ICD-10-CM | POA: Diagnosis not present

## 2016-04-16 DIAGNOSIS — K449 Diaphragmatic hernia without obstruction or gangrene: Secondary | ICD-10-CM

## 2016-04-16 DIAGNOSIS — K222 Esophageal obstruction: Secondary | ICD-10-CM | POA: Diagnosis not present

## 2016-04-16 MED ORDER — PANTOPRAZOLE SODIUM 40 MG PO TBEC: 40.0000 mg | DELAYED_RELEASE_TABLET | Freq: Every day | ORAL | Status: DC

## 2016-04-16 NOTE — Patient Instructions (Signed)
We have sent the following medications to your pharmacy for you to pick up at your convenience:  Pantoprazole   You have been scheduled for a Barium Esophogram at [facility] (1st floor of the hospital) on 04/24/2016 at 11:00am. Please arrive 15 minutes prior to your appointment for registration. Make certain not to have anything to eat or drink after midnight prior to your test. If you need to reschedule for any reason, please contact radiology at [phone] to do so. __________________________________________________________________ A barium swallow is an examination that concentrates on views of the esophagus. This tends to be a double contrast exam (barium and two liquids which, when combined, create a gas to distend the wall of the oesophagus) or single contrast (non-ionic iodine based). The study is usually tailored to your symptoms so a good history is essential. Attention is paid during the study to the form, structure and configuration of the esophagus, looking for functional disorders (such as aspiration, dysphagia, achalasia, motility and reflux) EXAMINATION You may be asked to change into a gown, depending on the type of swallow being performed. A radiologist and radiographer will perform the procedure. The radiologist will advise you of the type of contrast selected for your procedure and direct you during the exam. You will be asked to stand, sit or lie in several different positions and to hold a small amount of fluid in your mouth before being asked to swallow while the imaging is performed .In some instances you may be asked to swallow barium coated marshmallows to assess the motility of a solid food bolus. The exam can be recorded as a digital or video fluoroscopy procedure. POST PROCEDURE It will take 1-2 days for the barium to pass through your system. To facilitate this, it is important, unless otherwise directed, to increase your fluids for the next 24-48hrs and to resume  your normal diet.  This test typically takes about 30 minutes to perform. __________________________________________________________________________________

## 2016-04-16 NOTE — Progress Notes (Signed)
HISTORY OF PRESENT ILLNESS:  Kelly Mooney is a 70 y.o. female with past medical history as listed below. She was initially evaluated 06/12/2015 regarding chronic diarrhea, fecal incontinence, GERD, dysphagia, and paraesophageal hernia. Please see that dictation for details. She was last evaluated 09/19/2015 post EGD with dilation, manometry, upper GI series, increased PPI, and Questran therapy. Please see that dictation. The impressions at that time as well as plan as follows: ASSESSMENT: #1. Chronic GERD. Exacerbation responded to increased dose of omeprazole #2. Chronic diarrhea and fecal incontinence improved with Questran #3. Dysphagia improved post dilation of esophageal stricture #4. Paraesophageal hernia component small. Plan to observe #5. Colonoscopy with biopsies (negative) with Dr. Oletta Lamas May 2014  PLAN: #1. Reflux precautions. Advised #2. Continue omeprazole at current dose #3. Questran as needed to control diarrhea #4. Recommend routine GI follow-up in 1 year. Sooner if needed for questions or problems  She presents today with her husband with a chief complaint of "vomiting". She describes to me that approximate 5 times per week while eating she will need to stop and vomit. This can be with solids but mostly with liquids. She can't eat at other times without any issues. I cannot tell based on questioning whether this is similar to swelling issues she had prior to her last EGD. She has had no weight loss. She reports that she was having good control with omeprazole 40 mg daily, but less so before the prescription ran out. She decided to take ranitidine 150 mg daily. This is not helping. She requests alternative PPI. She continues with Questran approximately 3 times per week. This has controlled her bowels nicely. No incontinence. Last hemoglobin A1c was elevated at 9.2. GI review of systems is otherwise negative. Her weight has been stable. She is accompanied by her husband. He  participates in the encounter.   REVIEW OF SYSTEMS:  All non-GI ROS negative except for anxiety, hearing problems, sinus allergy, fatigue, shortness of breath with asthma  Past Medical History  Diagnosis Date  . Diabetes mellitus   . Hypercholesteremia   . Asthma   . Thyroid disease   . GERD (gastroesophageal reflux disease)   . Esophageal stricture   . Hiatal hernia   . Diverticulosis   . Colon polyps   . Paraesophageal hernia     Past Surgical History  Procedure Laterality Date  . Rhinoplasty      nasal polyps removed dr. Barrie Folk  . Cholecystectomy  1980  . Partail hysterectomy    . Bunionectomy  10/2012    right foot  . Esophageal manometry N/A 07/15/2015    Procedure: ESOPHAGEAL MANOMETRY (EM);  Surgeon: Irene Shipper, MD;  Location: [facility];  Service: Endoscopy;  Laterality: N/A;    Social History Kelly Mooney  reports that she has never smoked. She has never used smokeless tobacco. She reports that she does not drink alcohol or use illicit drugs.  family history includes Asthma in her brother and mother; Diabetes Mellitus I in her mother; Heart attack in her brother and father; Hyperlipidemia in her mother. There is no history of Colon cancer or Colon polyps.  Allergies  Allergen Reactions  . Iodine Itching  . Ivp Dye [Iodinated Diagnostic Agents] Other (See Comments)    unknown       PHYSICAL EXAMINATION: Vital signs: BP 136/76 mmHg  Pulse 80  Ht 5' 1.5" (1.562 m)  Wt 172 lb 4 oz (78.132 kg)  BMI 32.02 kg/m2  Constitutional: generally well-appearing, no acute  distress Psychiatric: alert and oriented x3, cooperative Eyes: extraocular movements intact, anicteric, conjunctiva pink Mouth: oral pharynx moist, no lesions Neck: supple no lymphadenopathy Cardiovascular: heart regular rate and rhythm, no murmur Lungs: clear to auscultation bilaterally Abdomen: soft, nontender, nondistended, no obvious ascites, no peritoneal signs, normal bowel sounds,  no organomegaly Rectal:Omitted Extremities: no clubbing cyanosis or lower extremity edema bilaterally Skin: no lesions on visible extremities Neuro: No focal deficits. Cranial nerves intact.  ASSESSMENT:  #1. Reports of intermittent self-induced vomiting with solids and more so liquids as described. Etiology not clear. Negative manometry last year #2. GERD. Worsening symptoms on ranitidine only #3. History of esophageal stricture status post dilation #4. Small paraesophageal hernia. Not obstructing. Aware #5. History of diarrhea with incontinence. Improved on Questran #6. Colonoscopy with biopsies, Dr. Oletta Lamas 2014.  PLAN:  #1. Arrange barium esophagram with tablet to further evaluate vague dysphagia with vomiting episodes #2. Reflux precautions #3. Prescribed pantoprazole 40 mg daily. Stop ranitidine #4. Continue Questran #5. Further recommendations, if any, post esophagram  40 minutes was spent face-to-face with the patient. Greater than 50% a time use for counseling regarding her multiple GI issues and workup strategy for "vomiting". Also, answering multiple questions from her and her husband

## 2016-04-24 ENCOUNTER — Ambulatory Visit (HOSPITAL_COMMUNITY)
Admission: RE | Admit: 2016-04-24 | Discharge: 2016-04-24 | Disposition: A | Discharge status: Home / Self Care | Payer: Medicare Other | Source: Ambulatory Visit | Attending: Internal Medicine | Admitting: Internal Medicine

## 2016-04-24 DIAGNOSIS — K219 Gastro-esophageal reflux disease without esophagitis: Secondary | ICD-10-CM

## 2016-04-24 DIAGNOSIS — K224 Dyskinesia of esophagus: Secondary | ICD-10-CM | POA: Insufficient documentation

## 2016-04-24 DIAGNOSIS — K222 Esophageal obstruction: Secondary | ICD-10-CM | POA: Insufficient documentation

## 2016-04-24 DIAGNOSIS — K449 Diaphragmatic hernia without obstruction or gangrene: Secondary | ICD-10-CM | POA: Insufficient documentation

## 2016-04-24 DIAGNOSIS — K210 Gastro-esophageal reflux disease with esophagitis: Secondary | ICD-10-CM | POA: Insufficient documentation

## 2016-05-01 ENCOUNTER — Ambulatory Visit (AMBULATORY_SURGERY_CENTER): Payer: Self-pay

## 2016-05-01 VITALS — Ht 61.0 in | Wt 172.2 lb

## 2016-05-01 DIAGNOSIS — R1319 Other dysphagia: Secondary | ICD-10-CM

## 2016-05-01 NOTE — Progress Notes (Signed)
No allergies to eggs or soy No home oxygen No diet meds No past problems with anesthesia EXCEPT VOMITING WITH GENERAL ANESTHESIA (NO N/V WITH BUNIONECTOMY AND NO N/V WITH EGD)  Has email and internet; declined emmi

## 2016-05-06 ENCOUNTER — Encounter: Payer: Self-pay | Admitting: Internal Medicine

## 2016-05-12 ENCOUNTER — Encounter: Payer: Self-pay | Admitting: Internal Medicine

## 2016-05-12 ENCOUNTER — Ambulatory Visit (AMBULATORY_SURGERY_CENTER): Payer: Medicare Other | Admitting: Internal Medicine

## 2016-05-12 VITALS — BP 124/62 | HR 69 | Temp 98.0°F | Resp 27 | Ht 61.0 in | Wt 172.0 lb

## 2016-05-12 DIAGNOSIS — R1310 Dysphagia, unspecified: Secondary | ICD-10-CM

## 2016-05-12 DIAGNOSIS — K219 Gastro-esophageal reflux disease without esophagitis: Secondary | ICD-10-CM | POA: Diagnosis not present

## 2016-05-12 DIAGNOSIS — K222 Esophageal obstruction: Secondary | ICD-10-CM | POA: Diagnosis not present

## 2016-05-12 LAB — GLUCOSE, CAPILLARY: Glucose-Capillary: 258 mg/dL — ABNORMAL HIGH (ref 65–99)

## 2016-05-12 MED ORDER — SODIUM CHLORIDE 0.9 % IV SOLN: 500.0000 mL | INTRAVENOUS | Status: DC

## 2016-05-12 NOTE — Progress Notes (Signed)
Called to room to assist during endoscopic procedure.  Patient ID and intended procedure confirmed with present staff. Received instructions for my participation in the procedure from the performing physician.  

## 2016-05-12 NOTE — Progress Notes (Signed)
To recovery, report to [facility], Therapist, sports, VSS

## 2016-05-12 NOTE — Op Note (Signed)
[place] Patient Name: Kelly Mooney Procedure Date: 05/12/2016 10:15 AM MRN: [id number] Endoscopist: Docia Chuck. Henrene Pastor , MD Age: 70 Referring MD:  Date of Birth: 26-Jul-1946 Gender: Female Account #: [id number] Procedure:                Upper GI endoscopy, with balloon dilation of the                            esophagus to 20 mm Indications:              Dysphagia. Known esophageal stricture Medicines:                Monitored Anesthesia Care Procedure:                Pre-Anesthesia Assessment:                           - Prior to the procedure, a History and Physical                            was performed, and patient medications and                            allergies were reviewed. The patient's tolerance of                            previous anesthesia was also reviewed. The risks                            and benefits of the procedure and the sedation                            options and risks were discussed with the patient.                            All questions were answered, and informed consent                            was obtained. Prior Anticoagulants: The patient has                            taken no previous anticoagulant or antiplatelet                            agents. ASA Grade Assessment: II - A patient with                            mild systemic disease. After reviewing the risks                            and benefits, the patient was deemed in                            satisfactory condition to undergo the procedure.  After obtaining informed consent, the endoscope was                            passed under direct vision. Throughout the                            procedure, the patient's blood pressure, pulse, and                            oxygen saturations were monitored continuously. The                            Model GIF-[id number]) scope was introduced                            through the mouth,  and advanced to the second part                            of duodenum. The upper GI endoscopy was                            accomplished without difficulty. The patient                            tolerated the procedure well. Scope In: Scope Out: Findings:                 One moderate (circumferential scarring or stenosis;                            an endoscope may pass) benign-appearing, intrinsic                            stenosis was found 35 cm from the incisors. This                            measured 1.4 cm (inner diameter) x less than one cm                            (in length). A TTS dilator was passed through the                            scope. Dilation with an 18-19-20 mm balloon dilator                            was performed to 20 mm. The dilation site was                            examined and showed moderate improvement in luminal                            narrowing. Estimated blood loss was minimal.  The exam of the esophagus was otherwise normal.                           The stomach was normal.                           The examined duodenum was normal.                           The cardia and gastric fundus were normal on                            retroflexion.                           A 4 cm hiatal hernia was present. Complications:            No immediate complications. Estimated Blood Loss:     Estimated blood loss: none. Impression:               - Benign-appearing esophageal stenosis. Dilated.                           - Normal stomach.                           - Normal examined duodenum.                           - 4 cm hiatal hernia.                           - No specimens collected. Recommendation:           - Patient has a contact number available for                            emergencies. The signs and symptoms of potential                            delayed complications were discussed with the                             patient. Return to normal activities tomorrow.                            Written discharge instructions were provided to the                            patient.                           - Resume previous diet.                           - Continue present medications. Docia Chuck. Henrene Pastor, MD 05/12/2016 10:36:04 AM This report has been signed electronically. CC Letter to:  Lona Kettle

## 2016-05-12 NOTE — Patient Instructions (Signed)
Discharge instructions given. Handout on a dilatation diet and a hiatal hernia given. Resume previous medications. YOU HAD AN ENDOSCOPIC PROCEDURE TODAY AT [facility]:   Refer to the procedure report that was given to you for any specific questions about what was found during the examination.  If the procedure report does not answer your questions, please call your gastroenterologist to clarify.  If you requested that your care partner not be given the details of your procedure findings, then the procedure report has been included in a sealed envelope for you to review at your convenience later.  YOU SHOULD EXPECT: Some feelings of bloating in the abdomen. Passage of more gas than usual.  Walking can help get rid of the air that was put into your GI tract during the procedure and reduce the bloating. If you had a lower endoscopy (such as a colonoscopy or flexible sigmoidoscopy) you may notice spotting of blood in your stool or on the toilet paper. If you underwent a bowel prep for your procedure, you may not have a normal bowel movement for a few days.  Please Note:  You might notice some irritation and congestion in your nose or some drainage.  This is from the oxygen used during your procedure.  There is no need for concern and it should clear up in a day or so.  SYMPTOMS TO REPORT IMMEDIATELY:   Following upper endoscopy (EGD)  Vomiting of blood or coffee ground material  New chest pain or pain under the shoulder blades  Painful or persistently difficult swallowing  New shortness of breath  Fever of 100F or higher  Black, tarry-looking stools  For urgent or emergent issues, a gastroenterologist can be reached at any hour by calling [phone].   DIET: Your first meal following the procedure should be a small meal and then it is ok to progress to your normal diet. Heavy or fried foods are harder to digest and may make you feel nauseous or bloated.  Likewise, meals  heavy in dairy and vegetables can increase bloating.  Drink plenty of fluids but you should avoid alcoholic beverages for 24 hours.  ACTIVITY:  You should plan to take it easy for the rest of today and you should NOT DRIVE or use heavy machinery until tomorrow (because of the sedation medicines used during the test).    FOLLOW UP: Our staff will call the number listed on your records the next business day following your procedure to check on you and address any questions or concerns that you may have regarding the information given to you following your procedure. If we do not reach you, we will leave a message.  However, if you are feeling well and you are not experiencing any problems, there is no need to return our call.  We will assume that you have returned to your regular daily activities without incident.  If any biopsies were taken you will be contacted by phone or by letter within the next 1-3 weeks.  Please call us at [phone] if you have not heard about the biopsies in 3 weeks.    SIGNATURES/CONFIDENTIALITY: You and/or your care partner have signed paperwork which will be entered into your electronic medical record.  These signatures attest to the fact that that the information above on your After Visit Summary has been reviewed and is understood.  Full responsibility of the confidentiality of this discharge information lies with you and/or your care-partner.

## 2016-05-13 ENCOUNTER — Telehealth: Payer: Self-pay

## 2016-05-13 LAB — GLUCOSE, CAPILLARY: GLUCOSE-CAPILLARY: 236 mg/dL — AB (ref 65–99)

## 2016-05-13 NOTE — Telephone Encounter (Signed)
Left message on answering machine. 

## 2016-06-18 ENCOUNTER — Other Ambulatory Visit: Payer: Self-pay | Admitting: Otolaryngology

## 2016-06-18 DIAGNOSIS — J322 Chronic ethmoidal sinusitis: Secondary | ICD-10-CM

## 2016-06-18 DIAGNOSIS — J320 Chronic maxillary sinusitis: Secondary | ICD-10-CM

## 2016-06-18 DIAGNOSIS — J343 Hypertrophy of nasal turbinates: Secondary | ICD-10-CM

## 2016-06-23 ENCOUNTER — Ambulatory Visit
Admission: RE | Admit: 2016-06-23 | Discharge: 2016-06-23 | Disposition: A | Discharge status: Home / Self Care | Payer: Medicare Other | Source: Ambulatory Visit | Attending: Otolaryngology | Admitting: Otolaryngology

## 2016-06-23 DIAGNOSIS — J320 Chronic maxillary sinusitis: Secondary | ICD-10-CM

## 2016-06-23 DIAGNOSIS — J322 Chronic ethmoidal sinusitis: Secondary | ICD-10-CM

## 2016-06-23 DIAGNOSIS — J343 Hypertrophy of nasal turbinates: Secondary | ICD-10-CM

## 2016-10-23 ENCOUNTER — Encounter: Payer: Self-pay | Admitting: Skilled Nursing Facility1

## 2016-10-23 ENCOUNTER — Encounter: Payer: Medicare Other | Attending: Family Medicine | Admitting: Skilled Nursing Facility1

## 2016-10-23 DIAGNOSIS — E119 Type 2 diabetes mellitus without complications: Secondary | ICD-10-CM | POA: Diagnosis not present

## 2016-10-23 DIAGNOSIS — Z713 Dietary counseling and surveillance: Secondary | ICD-10-CM | POA: Insufficient documentation

## 2016-10-23 NOTE — Progress Notes (Signed)
Diabetes Self-Management Education  Visit Type: First/Initial  Appt. Start Time: 10:00Appt. End Time: 11:00  10/23/2016  Kelly Mooney, identified by name and date of birth, is a 70 y.o. female with a diagnosis of Diabetes: Type 2.   ASSESSMENT  Height 5\' 2"  (1.575 m), weight 163 lb (73.9 kg). Body mass index is 29.81 kg/m. Pt states she used to be in denial about her diabetes diagnosis. Pt states she checks her blood sugar every day: average 165 and this is 2 hours after a meal.     Diabetes Self-Management Education - 10/23/16 1001      Visit Information   Visit Type First/Initial     Initial Visit   Diabetes Type Type 2   Are you currently following a meal plan? No   Are you taking your medications as prescribed? Yes   Date Diagnosed about 6 years ago     Health Coping   How would you rate your overall health? Good     Psychosocial Assessment   Patient Belief/Attitude about Diabetes Motivated to manage diabetes     Pre-Education Assessment   Patient understands the diabetes disease and treatment process. Needs Instruction   Patient understands incorporating nutritional management into lifestyle. Needs Instruction   Patient undertands incorporating physical activity into lifestyle. Needs Instruction   Patient understands using medications safely. Needs Instruction   Patient understands monitoring blood glucose, interpreting and using results Needs Instruction   Patient understands prevention, detection, and treatment of acute complications. Needs Instruction   Patient understands prevention, detection, and treatment of chronic complications. Needs Instruction   Patient understands how to develop strategies to address psychosocial issues. Needs Instruction   Patient understands how to develop strategies to promote health/change behavior. Needs Instruction     Complications   Last HgB A1C per patient/outside source 10.4 %   How often do you check your blood  sugar? 1-2 times/day   Have you had a dilated eye exam in the past 12 months? Yes   Have you had a dental exam in the past 12 months? Yes   Are you checking your feet? Yes   How many days per week are you checking your feet? 2     Exercise   Exercise Type ADL's;Light (walking / raking leaves)   How many days per week to you exercise? 1   How many minutes per day do you exercise? 35   Total minutes per week of exercise 35     Patient Education   Previous Diabetes Education No   Disease state  Definition of diabetes, type 1 and 2, and the diagnosis of diabetes;Factors that contribute to the development of diabetes   Physical activity and exercise  Role of exercise on diabetes management, blood pressure control and cardiac health.   Monitoring Purpose and frequency of SMBG.;Yearly dilated eye exam;Daily foot exams;Identified appropriate SMBG and/or A1C goals.   Acute complications Taught treatment of hypoglycemia - the 15 rule.   Chronic complications Lipid levels, blood glucose control and heart disease;Nephropathy, what it is, prevention of, the use of ACE, ARB's and early detection of through urine microalbumia.;Assessed and discussed foot care and prevention of foot problems;Retinopathy and reason for yearly dilated eye exams   Psychosocial adjustment Role of stress on diabetes     Individualized Goals (developed by patient)   Nutrition Follow meal plan discussed;Adjust meds/carbs with exercise as discussed   Physical Activity Exercise 1-2 times per week;15 minutes per day  Post-Education Assessment   Patient understands the diabetes disease and treatment process. Demonstrates understanding / competency   Patient understands incorporating nutritional management into lifestyle. Demonstrates understanding / competency   Patient undertands incorporating physical activity into lifestyle. Demonstrates understanding / competency   Patient understands using medications safely. Demonstrates  understanding / competency   Patient understands monitoring blood glucose, interpreting and using results Demonstrates understanding / competency   Patient understands prevention, detection, and treatment of acute complications. Demonstrates understanding / competency   Patient understands prevention, detection, and treatment of chronic complications. Demonstrates understanding / competency   Patient understands how to develop strategies to address psychosocial issues. Demonstrates understanding / competency   Patient understands how to develop strategies to promote health/change behavior. Demonstrates understanding / competency     Outcomes   Expected Outcomes Demonstrated interest in learning. Expect positive outcomes   Future DMSE PRN   Program Status Completed      Individualized Plan for Diabetes Self-Management Training:   Learning Objective:  Patient will have a greater understanding of diabetes self-management. Patient education plan is to attend individual and/or group sessions per assessed needs and concerns.   Plan:   Patient Instructions  -Always bring your meter with you everywhere you go -Always Properly dispose of your needles:  -Discard in a hard plastic/metal container with a lid (something the needle can't puncture)  -Write Do Not Recycle on the outside of the container  -Example: A laundry detergent bottle -Never use the same needle more than once -Eat 2-3 carbohydrate choices for each meal and 1 for each snack -A meal: carbohydrates, protein, vegetable -A snack: A Fruit OR Vegetable AND Protein  -Try to be more active -Always pay attention to your body keeping watchful of possible low blood sugar (below 70) or high blood sugar (above 200)  -Try to limit beef and pork to one day a week  -Turkey/chicken 2-3 days a week -Bean and rice or anything from the sea the rest of the week  -Check your feet every day  -Go for a walk 4 days a week for 25 minutes: check  your blood sugar before and a couple hours after  -Before you eat anything in the morning (fasting): 80-130 -2 hours faster you eat a meal: 80-180  -Write your sugar numbers down and pay attention to if they are within range or not  -Try making your eggs in olive oil  -Stop using butter/margarine   Expected Outcomes:  Demonstrated interest in learning. Expect positive outcomes  Education material provided: Living Well with Diabetes, Meal plan card, My Plate and Snack sheet  If problems or questions, patient to contact team via:  Phone  Future DSME appointment: PRN

## 2016-10-23 NOTE — Patient Instructions (Addendum)
-  Always bring your meter with you everywhere you go -Always Properly dispose of your needles:  -Discard in a hard plastic/metal container with a lid (something the needle can't puncture)  -Write Do Not Recycle on the outside of the container  -Example: A laundry detergent bottle -Never use the same needle more than once -Eat 2-3 carbohydrate choices for each meal and 1 for each snack -A meal: carbohydrates, protein, vegetable -A snack: A Fruit OR Vegetable AND Protein  -Try to be more active -Always pay attention to your body keeping watchful of possible low blood sugar (below 70) or high blood sugar (above 200)  -Try to limit beef and pork to one day a week  -Turkey/chicken 2-3 days a week -Bean and rice or anything from the sea the rest of the week  -Check your feet every day  -Go for a walk 4 days a week for 25 minutes: check your blood sugar before and a couple hours after  -Before you eat anything in the morning (fasting): 80-130 -2 hours faster you eat a meal: 80-180  -Write your sugar numbers down and pay attention to if they are within range or not  -Try making your eggs in olive oil  -Stop using butter/margarine

## 2017-01-25 ENCOUNTER — Other Ambulatory Visit: Payer: Self-pay | Admitting: Internal Medicine

## 2018-06-06 ENCOUNTER — Encounter: Payer: Self-pay | Admitting: Internal Medicine

## 2018-07-05 ENCOUNTER — Other Ambulatory Visit: Payer: Self-pay

## 2018-07-05 ENCOUNTER — Emergency Department (HOSPITAL_COMMUNITY): Payer: Medicare Other

## 2018-07-05 ENCOUNTER — Observation Stay (HOSPITAL_COMMUNITY)
Admission: EM | Admit: 2018-07-05 | Discharge: 2018-07-06 | Disposition: A | Discharge status: Home / Self Care | Payer: Medicare Other | Attending: Internal Medicine | Admitting: Internal Medicine

## 2018-07-05 ENCOUNTER — Encounter (HOSPITAL_COMMUNITY): Payer: Self-pay | Admitting: *Deleted

## 2018-07-05 DIAGNOSIS — E118 Type 2 diabetes mellitus with unspecified complications: Secondary | ICD-10-CM | POA: Diagnosis not present

## 2018-07-05 DIAGNOSIS — Z79899 Other long term (current) drug therapy: Secondary | ICD-10-CM | POA: Insufficient documentation

## 2018-07-05 DIAGNOSIS — E119 Type 2 diabetes mellitus without complications: Secondary | ICD-10-CM | POA: Insufficient documentation

## 2018-07-05 DIAGNOSIS — J4520 Mild intermittent asthma, uncomplicated: Secondary | ICD-10-CM | POA: Diagnosis not present

## 2018-07-05 DIAGNOSIS — Z87891 Personal history of nicotine dependence: Secondary | ICD-10-CM | POA: Insufficient documentation

## 2018-07-05 DIAGNOSIS — J45909 Unspecified asthma, uncomplicated: Secondary | ICD-10-CM | POA: Insufficient documentation

## 2018-07-05 DIAGNOSIS — R42 Dizziness and giddiness: Secondary | ICD-10-CM | POA: Diagnosis present

## 2018-07-05 DIAGNOSIS — E039 Hypothyroidism, unspecified: Secondary | ICD-10-CM | POA: Diagnosis not present

## 2018-07-05 DIAGNOSIS — I10 Essential (primary) hypertension: Secondary | ICD-10-CM | POA: Insufficient documentation

## 2018-07-05 DIAGNOSIS — Z794 Long term (current) use of insulin: Secondary | ICD-10-CM

## 2018-07-05 DIAGNOSIS — G459 Transient cerebral ischemic attack, unspecified: Principal | ICD-10-CM | POA: Diagnosis present

## 2018-07-05 LAB — DIFFERENTIAL
Abs Immature Granulocytes: 0 10*3/uL (ref 0.0–0.1)
Basophils Absolute: 0.1 10*3/uL (ref 0.0–0.1)
Basophils Relative: 1 %
EOS ABS: 0.2 10*3/uL (ref 0.0–0.7)
EOS PCT: 3 %
Immature Granulocytes: 0 %
LYMPHS ABS: 2.6 10*3/uL (ref 0.7–4.0)
LYMPHS PCT: 35 %
MONO ABS: 0.4 10*3/uL (ref 0.1–1.0)
Monocytes Relative: 6 %
Neutro Abs: 3.9 10*3/uL (ref 1.7–7.7)
Neutrophils Relative %: 55 %

## 2018-07-05 LAB — COMPREHENSIVE METABOLIC PANEL
ALBUMIN: 4 g/dL (ref 3.5–5.0)
ALK PHOS: 41 U/L (ref 38–126)
ALT: 17 U/L (ref 0–44)
ANION GAP: 7 (ref 5–15)
AST: 19 U/L (ref 15–41)
BILIRUBIN TOTAL: 0.6 mg/dL (ref 0.3–1.2)
BUN: 14 mg/dL (ref 8–23)
CALCIUM: 9.9 mg/dL (ref 8.9–10.3)
CO2: 27 mmol/L (ref 22–32)
Chloride: 108 mmol/L (ref 98–111)
Creatinine, Ser: 0.81 mg/dL (ref 0.44–1.00)
GFR calc Af Amer: >60 mL/min (ref >60)
GFR calc non Af Amer: >60 mL/min (ref >60)
GLUCOSE: 123 mg/dL — AB (ref 70–99)
POTASSIUM: 3.9 mmol/L (ref 3.5–5.1)
Sodium: 142 mmol/L (ref 135–145)
Total Protein: 7 g/dL (ref 6.5–8.1)

## 2018-07-05 LAB — I-STAT CHEM 8, ED
BUN: 15 mg/dL (ref 8–23)
CALCIUM ION: 1.27 mmol/L (ref 1.15–1.40)
CHLORIDE: 105 mmol/L (ref 98–111)
Creatinine, Ser: 0.7 mg/dL (ref 0.44–1.00)
Glucose, Bld: 117 mg/dL — ABNORMAL HIGH (ref 70–99)
HCT: 42 % (ref 36.0–46.0)
HEMOGLOBIN: 14.3 g/dL (ref 12.0–15.0)
Potassium: 3.9 mmol/L (ref 3.5–5.1)
SODIUM: 143 mmol/L (ref 135–145)
TCO2: 25 mmol/L (ref 22–32)

## 2018-07-05 LAB — CBC
HEMATOCRIT: 43.7 % (ref 36.0–46.0)
Hemoglobin: 13.9 g/dL (ref 12.0–15.0)
MCH: 27.9 pg (ref 26.0–34.0)
MCHC: 31.8 g/dL (ref 30.0–36.0)
MCV: 87.6 fL (ref 78.0–100.0)
PLATELETS: 258 10*3/uL (ref 150–400)
RBC: 4.99 MIL/uL (ref 3.87–5.11)
RDW: 13 % (ref 11.5–15.5)
WBC: 7.3 10*3/uL (ref 4.0–10.5)

## 2018-07-05 LAB — CBG MONITORING, ED
GLUCOSE-CAPILLARY: 117 mg/dL — AB (ref 70–99)
GLUCOSE-CAPILLARY: 128 mg/dL — AB (ref 70–99)

## 2018-07-05 LAB — PROTIME-INR
INR: 0.93
PROTHROMBIN TIME: 12.3 s (ref 11.4–15.2)

## 2018-07-05 LAB — GLUCOSE, CAPILLARY: GLUCOSE-CAPILLARY: 101 mg/dL — AB (ref 70–99)

## 2018-07-05 LAB — APTT: aPTT: 34 seconds (ref 24–36)

## 2018-07-05 LAB — I-STAT TROPONIN, ED: TROPONIN I, POC: 0 ng/mL (ref 0.00–0.08)

## 2018-07-05 MED ORDER — MONTELUKAST SODIUM 10 MG PO TABS
10.0000 mg | ORAL_TABLET | Freq: Every day | ORAL | Status: DC
  Administered 2018-07-06: 10 mg via ORAL
  Filled 2018-07-05: qty 1

## 2018-07-05 MED ORDER — GADOBENATE DIMEGLUMINE 529 MG/ML IV SOLN
15.0000 mL | Freq: Once | INTRAVENOUS | Status: AC | PRN
  Administered 2018-07-05: 15 mL via INTRAVENOUS

## 2018-07-05 MED ORDER — ALBUTEROL SULFATE (2.5 MG/3ML) 0.083% IN NEBU
3.0000 mL | INHALATION_SOLUTION | Freq: Two times a day (BID) | RESPIRATORY_TRACT | Status: DC
  Administered 2018-07-06: 3 mL via RESPIRATORY_TRACT
  Filled 2018-07-05: qty 3

## 2018-07-05 MED ORDER — ALBUTEROL SULFATE (2.5 MG/3ML) 0.083% IN NEBU
3.0000 mL | INHALATION_SOLUTION | Freq: Four times a day (QID) | RESPIRATORY_TRACT | Status: DC
  Administered 2018-07-05: 3 mL via RESPIRATORY_TRACT
  Filled 2018-07-05: qty 3

## 2018-07-05 MED ORDER — ACETAMINOPHEN 160 MG/5ML PO SOLN: 650.0000 mg | ORAL | Status: DC | PRN

## 2018-07-05 MED ORDER — AMLODIPINE BESYLATE 10 MG PO TABS
10.0000 mg | ORAL_TABLET | Freq: Every day | ORAL | Status: DC
  Administered 2018-07-06: 10 mg via ORAL
  Filled 2018-07-05: qty 1

## 2018-07-05 MED ORDER — ACETAMINOPHEN 325 MG PO TABS: 650.0000 mg | ORAL_TABLET | ORAL | Status: DC | PRN

## 2018-07-05 MED ORDER — ROSUVASTATIN CALCIUM 20 MG PO TABS
20.0000 mg | ORAL_TABLET | Freq: Every day | ORAL | Status: DC
  Administered 2018-07-05: 20 mg via ORAL
  Filled 2018-07-05: qty 1

## 2018-07-05 MED ORDER — STROKE: EARLY STAGES OF RECOVERY BOOK
Freq: Once | Status: AC
  Administered 2018-07-05: 1
  Filled 2018-07-05: qty 1

## 2018-07-05 MED ORDER — ACETAMINOPHEN 650 MG RE SUPP: 650.0000 mg | RECTAL | Status: DC | PRN

## 2018-07-05 MED ORDER — LEVOTHYROXINE SODIUM 137 MCG PO TABS
137.0000 ug | ORAL_TABLET | Freq: Every day | ORAL | Status: DC
  Administered 2018-07-06: 137 ug via ORAL
  Filled 2018-07-05: qty 1

## 2018-07-05 MED ORDER — PANTOPRAZOLE SODIUM 40 MG PO TBEC
40.0000 mg | DELAYED_RELEASE_TABLET | Freq: Every day | ORAL | Status: DC
  Administered 2018-07-06: 40 mg via ORAL
  Filled 2018-07-05: qty 1

## 2018-07-05 MED ORDER — ASPIRIN 300 MG RE SUPP: 300.0000 mg | Freq: Every day | RECTAL | Status: DC

## 2018-07-05 MED ORDER — ATORVASTATIN CALCIUM 20 MG PO TABS: 40.0000 mg | ORAL_TABLET | Freq: Every day | ORAL | Status: DC

## 2018-07-05 MED ORDER — QUINAPRIL HCL 10 MG PO TABS
40.0000 mg | ORAL_TABLET | Freq: Every day | ORAL | Status: DC
  Administered 2018-07-06: 40 mg via ORAL
  Filled 2018-07-05: qty 4

## 2018-07-05 MED ORDER — HYDROCODONE-ACETAMINOPHEN 5-325 MG PO TABS: 1.0000 | ORAL_TABLET | Freq: Every day | ORAL | Status: DC | PRN

## 2018-07-05 MED ORDER — INSULIN ASPART 100 UNIT/ML ~~LOC~~ SOLN
0.0000 [IU] | SUBCUTANEOUS | Status: DC
  Administered 2018-07-06: 1 [IU] via SUBCUTANEOUS

## 2018-07-05 MED ORDER — FENOFIBRATE 160 MG PO TABS
160.0000 mg | ORAL_TABLET | Freq: Every day | ORAL | Status: DC
  Administered 2018-07-06: 160 mg via ORAL
  Filled 2018-07-05: qty 1

## 2018-07-05 MED ORDER — ASPIRIN 325 MG PO TABS
325.0000 mg | ORAL_TABLET | Freq: Every day | ORAL | Status: DC
  Administered 2018-07-05 – 2018-07-06 (×2): 325 mg via ORAL
  Filled 2018-07-05 (×2): qty 1

## 2018-07-05 MED ORDER — ALBUTEROL SULFATE (2.5 MG/3ML) 0.083% IN NEBU: 2.5000 mg | INHALATION_SOLUTION | RESPIRATORY_TRACT | Status: DC | PRN

## 2018-07-05 MED ORDER — ENOXAPARIN SODIUM 40 MG/0.4ML ~~LOC~~ SOLN
40.0000 mg | SUBCUTANEOUS | Status: DC
  Administered 2018-07-05: 40 mg via SUBCUTANEOUS
  Filled 2018-07-05: qty 0.4
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
## 2018-07-05 NOTE — ED Notes (Signed)
Patient transported to MRI 

## 2018-07-05 NOTE — ED Notes (Signed)
RN attempted to call report to floor; RN to call back-Monique,RN  
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
## 2018-07-05 NOTE — ED Provider Notes (Signed)
[facility] Provider Note   CSN: [id number] Arrival date & time: 07/05/18  1425   An emergency department physician performed an initial assessment on this suspected stroke patient at 1454.  History   Chief Complaint Chief Complaint  Patient presents with  . Cerebrovascular Accident    HPI Kelly Mooney is a 72 y.o. female.  72 year old female presents with 3 episodes of diplopia and dizziness which resolved spontaneously.  Associated mild headache without emesis.  Denies any weakness in arms or legs.  Denies any ataxia.  Recent changes to her medications.  Patient seen by her ophthalmologist today and was sent here for further evaluation for possible stroke.     Past Medical History:  Diagnosis Date  . Asthma    since age 32  . Colon polyps   . COPD (chronic obstructive pulmonary disease) ([place])   . Diabetes mellitus   . Diverticulosis   . Esophageal stricture   . GERD (gastroesophageal reflux disease)   . Hiatal hernia   . Hypercholesteremia   . Hypertension   . Paraesophageal hernia   . Thyroid disease     There are no active problems to display for this patient.   Past Surgical History:  Procedure Laterality Date  . BUNIONECTOMY  10/2012   right foot  . CHOLECYSTECTOMY  1980  . ESOPHAGEAL MANOMETRY N/A 07/15/2015   Procedure: ESOPHAGEAL MANOMETRY (EM);  Surgeon: Irene Shipper, MD;  Location: [facility];  Service: Endoscopy;  Laterality: N/A;  . partail hysterectomy    . RHINOPLASTY     nasal polyps removed dr. Barrie Folk     OB History   None      Home Medications    Prior to Admission medications   Medication Sig Start Date End Date Taking? Authorizing Provider  albuterol (PROVENTIL HFA;VENTOLIN HFA) 108 (90 BASE) MCG/ACT inhaler Inhale 2 puffs into the lungs every 6 (six) hours as needed. For shortness of breath    [provider]  amLODipine (NORVASC) 10 MG tablet Take 10 mg by mouth daily.     [provider]  Adair Patter [phone] MCG/INH AEPB INHALE ONE PUFF INTO THE LUNGS ONCE A DAY 04/26/15   [provider]  cholestyramine Lucrezia Starch) 4 G packet Take Questran twice a day, not to be taken within 2 hours of other medications 06/12/15   Irene Shipper, MD  clobetasol cream (TEMOVATE) 0.05 % APPLY A THIN LAYER TO THE AFFECTED AREA(S) BY TOPICAL ROUTE 2 TIMES PER DAY 02/17/16   [provider]  fenofibrate 160 MG tablet  04/11/16   [provider]  glimepiride (AMARYL) 4 MG tablet Take 4 mg by mouth 2 (two) times daily.    [provider]  hydrochlorothiazide (HYDRODIURIL) 25 MG tablet Take 25 mg by mouth daily. For thyroid    [provider]  levothyroxine (SYNTHROID, LEVOTHROID) 137 MCG tablet Take 137 mcg by mouth daily before breakfast.    [provider]  montelukast (SINGULAIR) 10 MG tablet TAKE ONE TABLET BY MOUTH ONE TIME DAILY IN THE P.M. 03/21/15   [provider]  pantoprazole (PROTONIX) 40 MG tablet TAKE 1 TABLET (40 MG TOTAL) BY MOUTH DAILY. 01/26/17   Irene Shipper, MD  quinapril (ACCUPRIL) 40 MG tablet Take 40 mg by mouth at bedtime.    [provider]  valACYclovir (VALTREX) 1000 MG tablet Reported on 05/12/2016 03/13/16   [provider]    Family History Family History  Problem Relation Age of Onset  . Diabetes Mellitus I Mother   . Hyperlipidemia Mother   . Asthma Mother   . Heart attack Father   . Heart attack Brother   . Asthma Brother   . Colon cancer Neg Hx   . Colon polyps Neg Hx     Social History Social History   Tobacco Use  . Smoking status: Former Research scientist (life sciences)  . Smokeless tobacco: Never Used  . Tobacco comment: quit 1997  Substance Use Topics  . Alcohol use: No    Alcohol/week: 0.0 standard drinks  . Drug use: No     Allergies   Iodine and Ivp dye [iodinated diagnostic agents]   Review of Systems Review of Systems  All other systems reviewed and are  negative.    Physical Exam Updated Vital Signs BP 138/65 (BP Location: Left Arm)   Pulse 78   Temp 98 F (36.7 C) (Oral)   Resp 16   Ht 1.562 m (5' 1.5")   Wt 68.9 kg   SpO2 96%   BMI 28.26 kg/m   Physical Exam  Constitutional: She is oriented to person, place, and time. She appears well-developed and well-nourished.  Non-toxic appearance. No distress.  HENT:  Head: Normocephalic and atraumatic.  Eyes: Pupils are equal, round, and reactive to light. Conjunctivae, EOM and lids are normal.  Neck: Normal range of motion. Neck supple. No tracheal deviation present. No thyroid mass present.  Cardiovascular: Normal rate, regular rhythm and normal heart sounds. Exam reveals no gallop.  No murmur heard. Pulmonary/Chest: Effort normal and breath sounds normal. No stridor. No respiratory distress. She has no decreased breath sounds. She has no wheezes. She has no rhonchi. She has no rales.  Abdominal: Soft. Normal appearance and bowel sounds are normal. She exhibits no distension. There is no tenderness. There is no rebound and no CVA tenderness.  Musculoskeletal: Normal range of motion. She exhibits no edema or tenderness.  Neurological: She is alert and oriented to person, place, and time. She has normal strength. No cranial nerve deficit or sensory deficit. She displays a negative Romberg sign. GCS eye subscore is 4. GCS verbal subscore is 5. GCS motor subscore is 6.  Skin: Skin is warm and dry. No abrasion and no rash noted.  Psychiatric: She has a normal mood and affect. Her speech is normal and behavior is normal.  Nursing note and vitals reviewed.    ED Treatments / Results  Labs (all labs ordered are listed, but only abnormal results are displayed) Labs Reviewed  CBG MONITORING, ED - Abnormal; Notable for the following components:      Result Value   Glucose-Capillary 117 (*)    All other components within normal limits  I-STAT CHEM 8, ED - Abnormal; Notable for the following  components:   Glucose, Bld 117 (*)    All other components within normal limits  PROTIME-INR  APTT  CBC  DIFFERENTIAL  COMPREHENSIVE METABOLIC PANEL  I-STAT TROPONIN, ED    EKG EKG Interpretation  Date/Time:  Tuesday July 05 2018 15:20:54 EDT Ventricular Rate:  88 PR Interval:    QRS Duration: 107 QT Interval:  366 QTC Calculation: 443 R Axis:   58 Text Interpretation:  Sinus rhythm Borderline T abnormalities, anterior leads No significant change since last tracing Confirmed by Lacretia Leigh ([id number]) on 07/05/2018 3:47:24 PM   Radiology Ct Head Code Stroke Wo Contrast  Result Date: 07/05/2018 CLINICAL DATA:  Code stroke. Gait changes. Vision changes and aphasia  EXAM: CT HEAD WITHOUT CONTRAST TECHNIQUE: Contiguous axial images were obtained from the base of the skull through the vertex without intravenous contrast. COMPARISON:  None. FINDINGS: Brain: No evidence of acute infarction, hemorrhage, hydrocephalus, extra-axial collection or mass lesion/mass effect. Vascular: No hyperdense vessel.  Atherosclerotic calcification. Skull: Normal. Negative for fracture or focal lesion. Sinuses/Orbits: Bilateral cataract resection Other: These results were communicated to Dr. Leonie Man at 3:11 pmon 8/20/2019by text page via the [facility] messaging system. ASPECTS [facility] Stroke Program Early CT Score) - Ganglionic level infarction (caudate, lentiform nuclei, internal capsule, insula, M1-M3 cortex): 7 - Supraganglionic infarction (M4-M6 cortex): 3 Total score (0-10 with 10 being normal): 10 IMPRESSION: Normal appearance of the brain. Electronically Signed   By: Monte Fantasia M.D.   On: 07/05/2018 15:12    Procedures Procedures (including critical care time)  Medications Ordered in ED Medications - No data to display   Initial Impression / Assessment and Plan / ED Course  I have reviewed the triage vital signs and the nursing notes.  Pertinent labs & imaging results that were available during my  care of the patient were reviewed by me and considered in my medical decision making (see chart for details).     Pt seen by neurology who reccomends mra brain/neck Results show no scute findings Neurology recc admission  Final Clinical Impressions(s) / ED Diagnoses   Final diagnoses:  None    ED Discharge Orders    None       Lacretia Leigh, MD 07/05/18 1752

## 2018-07-05 NOTE — Consult Note (Addendum)
Reason for Consult:Code stroke Referring Physician: Dr Ferrel Logan is an 72 y.o. female.  HPI:  72 year old female, she presents after developing what she describes as diplopia that started yesterday while she was driving her vehicle.  She had a hard time seeing straight and felt like she was seeing double of the car in front of her, the road lines, the seem to get better when she parked and got to a friend's house, she then got back in the car and felt like it was occurring again, ultimately she had another person drive her home.  This morning when she tried to drive she again developed  diplopia, nausea and gait ataxia when she started driving again.CBG was 163 mg% at home. she went to the ophthalmology office and saw Dr Kathlen Mody who found mild upgaze restriction only on exam, and  sent her to the emergency department for stroke work-up and an emergent MRI.  She denies headache, vertigo, numbness, focal weakness or speech difficulties. No prior h/o stroke or TIA or neurological problems. LSN 11 am 07/05/18 TPA no due to resolution of symptoms MRS baseline 0   14 system review of systems was obtained and is positive for only as documented in HPI  Past Medical History:  Diagnosis Date  . Asthma    since age 55  . Colon polyps   . COPD (chronic obstructive pulmonary disease) ([place])   . Diabetes mellitus   . Diverticulosis   . Esophageal stricture   . GERD (gastroesophageal reflux disease)   . Hiatal hernia   . Hypercholesteremia   . Hypertension   . Paraesophageal hernia   . Thyroid disease     Past Surgical History:  Procedure Laterality Date  . BUNIONECTOMY  10/2012   right foot  . CHOLECYSTECTOMY  1980  . ESOPHAGEAL MANOMETRY N/A 07/15/2015   Procedure: ESOPHAGEAL MANOMETRY (EM);  Surgeon: Irene Shipper, MD;  Location: [facility];  Service: Endoscopy;  Laterality: N/A;  . partail hysterectomy    . RHINOPLASTY     nasal polyps removed dr. Barrie Folk    Family History   Problem Relation Age of Onset  . Diabetes Mellitus I Mother   . Hyperlipidemia Mother   . Asthma Mother   . Heart attack Father   . Heart attack Brother   . Asthma Brother   . Colon cancer Neg Hx   . Colon polyps Neg Hx     Social History:  reports that she has quit smoking. She has never used smokeless tobacco. She reports that she does not drink alcohol or use drugs.  Allergies:  Allergies  Allergen Reactions  . Iodine Itching  . Ivp Dye [Iodinated Diagnostic Agents] Other (See Comments)    unknown    Medications: I have reviewed the patient's current medications.  Results for orders placed or performed during the hospital encounter of 07/05/18 (from the past 48 hour(s))  Protime-INR     Status: None   Collection Time: 07/05/18  2:47 PM  Result Value Ref Range   Prothrombin Time 12.3 11.4 - 15.2 seconds   INR 0.93     Comment: Performed at [facility], [place] [address], [place], [place] [address]  APTT     Status: None   Collection Time: 07/05/18  2:47 PM  Result Value Ref Range   aPTT 34 24 - 36 seconds    Comment: Performed at [facility] [address], [place], [place] [address]  CBC  Status: None   Collection Time: 07/05/18  2:47 PM  Result Value Ref Range   WBC 7.3 4.0 - 10.5 K/uL   RBC 4.99 3.87 - 5.11 MIL/uL   Hemoglobin 13.9 12.0 - 15.0 g/dL   HCT 43.7 36.0 - 46.0 %   MCV 87.6 78.0 - 100.0 fL   MCH 27.9 26.0 - 34.0 pg   MCHC 31.8 30.0 - 36.0 g/dL   RDW 13.0 11.5 - 15.5 %   Platelets 258 150 - 400 K/uL    Comment: Performed at [facility] [address] [place], [place] [address]  Differential     Status: None   Collection Time: 07/05/18  2:47 PM  Result Value Ref Range   Neutrophils Relative % 55 %   Neutro Abs 3.9 1.7 - 7.7 K/uL   Lymphocytes Relative 35 %   Lymphs Abs 2.6 0.7 - 4.0 K/uL   Monocytes Relative 6 %   Monocytes Absolute 0.4 0.1 - 1.0 K/uL   Eosinophils Relative 3 %   Eosinophils Absolute 0.2 0.0 - 0.7 K/uL    Basophils Relative 1 %   Basophils Absolute 0.1 0.0 - 0.1 K/uL   Immature Granulocytes 0 %   Abs Immature Granulocytes 0.0 0.0 - 0.1 K/uL    Comment: Performed at [facility], [place] [address] [place], [place] [address]  Comprehensive metabolic panel     Status: Abnormal   Collection Time: 07/05/18  2:47 PM  Result Value Ref Range   Sodium 142 135 - 145 mmol/L   Potassium 3.9 3.5 - 5.1 mmol/L   Chloride 108 98 - 111 mmol/L   CO2 27 22 - 32 mmol/L   Glucose, Bld 123 (H) 70 - 99 mg/dL   BUN 14 8 - 23 mg/dL   Creatinine, Ser 0.81 0.44 - 1.00 mg/dL   Calcium 9.9 8.9 - 10.3 mg/dL   Total Protein 7.0 6.5 - 8.1 g/dL   Albumin 4.0 3.5 - 5.0 g/dL   AST 19 15 - 41 U/L   ALT 17 0 - 44 U/L   Alkaline Phosphatase 41 38 - 126 U/L   Total Bilirubin 0.6 0.3 - 1.2 mg/dL   GFR calc non Af Amer >60 >60 mL/min   GFR calc Af Amer >60 >60 mL/min    Comment: (NOTE) The eGFR has been calculated using the CKD EPI equation. This calculation has not been validated in all clinical situations. eGFR's persistently <60 mL/min signify possible Chronic Kidney Disease.    Anion gap 7 5 - 15    Comment: Performed at [facility] [address] [place], [place] [address]  I-stat troponin, ED     Status: None   Collection Time: 07/05/18  2:52 PM  Result Value Ref Range   Troponin i, poc 0.00 0.00 - 0.08 ng/mL   Comment 3            Comment: Due to the release kinetics of cTnI, a negative result within the first hours of the onset of symptoms does not rule out myocardial infarction with certainty. If myocardial infarction is still suspected, repeat the test at appropriate intervals.   I-Stat Chem 8, ED     Status: Abnormal   Collection Time: 07/05/18  2:54 PM  Result Value Ref Range   Sodium 143 135 - 145 mmol/L   Potassium 3.9 3.5 - 5.1 mmol/L   Chloride 105 98 - 111 mmol/L   BUN 15 8 - 23 mg/dL  Creatinine, Ser 0.70 0.44 - 1.00 mg/dL   Glucose, Bld 117 (H) 70 - 99 mg/dL   Calcium,  Ion 1.27 1.15 - 1.40 mmol/L   TCO2 25 22 - 32 mmol/L   Hemoglobin 14.3 12.0 - 15.0 g/dL   HCT 42.0 36.0 - 46.0 %  CBG monitoring, ED     Status: Abnormal   Collection Time: 07/05/18  3:25 PM  Result Value Ref Range   Glucose-Capillary 117 (H) 70 - 99 mg/dL   Comment 1 Notify RN    Comment 2 Document in Chart     Ct Head Code Stroke Wo Contrast  Result Date: 07/05/2018 CLINICAL DATA:  Code stroke. Gait changes. Vision changes and aphasia EXAM: CT HEAD WITHOUT CONTRAST TECHNIQUE: Contiguous axial images were obtained from the base of the skull through the vertex without intravenous contrast. COMPARISON:  None. FINDINGS: Brain: No evidence of acute infarction, hemorrhage, hydrocephalus, extra-axial collection or mass lesion/mass effect. Vascular: No hyperdense vessel.  Atherosclerotic calcification. Skull: Normal. Negative for fracture or focal lesion. Sinuses/Orbits: Bilateral cataract resection Other: These results were communicated to Dr. Leonie Man at 3:11 pmon 8/20/2019by text page via the [facility] messaging system. ASPECTS [facility] Stroke Program Early CT Score) - Ganglionic level infarction (caudate, lentiform nuclei, internal capsule, insula, M1-M3 cortex): 7 - Supraganglionic infarction (M4-M6 cortex): 3 Total score (0-10 with 10 being normal): 10 IMPRESSION: Normal appearance of the brain. Electronically Signed   By: Monte Fantasia M.D.   On: 07/05/2018 15:12   CT-scan of the brain no acute abnormality MRI examination of the brain. pending     ROS Blood pressure 138/65, pulse 78, temperature 98 F (36.7 C), temperature source Oral, resp. rate 16, height 5' 1.5" (1.562 m), weight 68.9 kg, SpO2 96 %. Physical Exam Pleasant anxious looking elderly caucasian lady not in distress. . Afebrile. Head is nontraumatic. Neck is supple without bruit.    Cardiac exam no murmur or gallop. Lungs are clear to auscultation. Distal pulses are well felt. Neurological Exam ;  Awake  Alert oriented x 3. Normal  speech and language.eye movements full without nystagmus.fundi were not visualized. Vision acuity and fields appear normal. Hearing is normal. Palatal movements are normal. Face symmetric. Tongue midline. Normal strength, tone, reflexes and coordination. Normal sensation. Gait deferred.  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 I have personally examined this patient, reviewed notes, independently viewed imaging studies, participated in medical decision making and plan of care.ROS completed by me personally and pertinent positives fully documented  I have made any additions or clarifications directly to the above note.  Recommend admit for stroke w/u. Check MRI brain and MRA brain and neck as patient is allergic to CT dye.Check echo and lipids, HbA1c.  Dual antiplaltelet therapy aspirin and plavix x 3 weeks then aspirin alone. Greater than 50 % time during this 80 minute consult was spent on counselling and coordination of care about TIA/stroke risk, D/W Dr Zenia Resides and [facility] and answering questions. Antony Contras, MD Medical Director [facility] Pager: [phone] 07/05/2018 4:22 PM  Antony Contras 07/05/2018, 4:21 PM    Note: This document was prepared with digital dictation and possible smart phrase technology. Any transcriptional errors that result from this process are unintentional.

## 2018-07-05 NOTE — H&P (Signed)
History and Physical  Patient Name: Kelly Mooney     UXL:[id number]    DOB: Sep 18, 1946    DOA: 07/05/2018 PCP: Lawerance Cruel, MD   Patient coming from: Home     Chief Complaint: Diplopia, ataxia  HPI: Kelly Mooney is a 72 y.o. female with a past medical history significant for NIDDM, HTN, GERD, asthma, hypothyroidism and chronic diarrhea who presents with transient diplopia while driving.  Patient was in her usual state of health until yesterday, she was driving, when "something happened" and she suddenly had double vision, felt like the lines in cars and trees in front of her were "intermingled".  This lasted about 30 minutes, until she got to her destination, then when she got out of the car it resolved, but recurred later last night when she drove home.  She went to an eye doctor today, who was concerned about her eye movement and sent her to the ER.  Through all of this, she has had some vague ataxia.  She has had no vertigo/spinning sensation.  She has had no numbness, tingling, focal weakness, vision loss.  She thinks her ophthalmologist thought she had slurred speech.  ED course: - Afebrile, heart rate 83, respirations and pulse ox normal, blood pressure 156/68 -Na 142, K 3.9, Cr 0.8 (baseline 0.9), WBC 7.3 K, Hgb 13.9 - Coags normal - Opponent negative -ECG normal sinus rhythm -CT head unremarkable - He was evaluated by neurology who suspected a TIA, recommended observation overnight, TIA work-up.     Review of systems:  Review of Systems  Constitutional: Negative for chills, fever and malaise/fatigue.  HENT: Negative for sinus pain, sore throat and tinnitus.   Eyes: Positive for double vision. Negative for blurred vision, photophobia, pain, discharge and redness.  Respiratory: Negative for cough, sputum production, shortness of breath and stridor.   Cardiovascular: Negative for chest pain, palpitations and leg swelling.  Gastrointestinal: Negative for abdominal  pain and vomiting.  Genitourinary: Negative for dysuria, flank pain, frequency, hematuria and urgency.  Neurological: Positive for dizziness and speech change. Negative for tingling, tremors, sensory change, focal weakness, seizures, loss of consciousness, weakness and headaches.  All other systems reviewed and are negative.        Past Medical History:  Diagnosis Date  . Asthma    since age 51  . Colon polyps   . COPD (chronic obstructive pulmonary disease) ([place])   . Diabetes mellitus   . Diverticulosis   . Esophageal stricture   . GERD (gastroesophageal reflux disease)   . Hiatal hernia   . Hypercholesteremia   . Hypertension   . Paraesophageal hernia   . Thyroid disease     Past Surgical History:  Procedure Laterality Date  . BUNIONECTOMY  10/2012   right foot  . CHOLECYSTECTOMY  1980  . ESOPHAGEAL MANOMETRY N/A 07/15/2015   Procedure: ESOPHAGEAL MANOMETRY (EM);  Surgeon: Irene Shipper, MD;  Location: [facility];  Service: Endoscopy;  Laterality: N/A;  . partail hysterectomy    . RHINOPLASTY     nasal polyps removed dr. Barrie Folk    Social History: Patient lives alone.  Her husband recently deceased.  Patient walks unassisted, still drives.  She worked for [employer], semi-retired.  She  reports that she has quit smoking. She has never used smokeless tobacco. She reports that she does not drink alcohol or use drugs.  Allergies  Allergen Reactions  . Chloramphenicol Anaphylaxis  . Ivp Dye [Iodinated Diagnostic Agents] Anaphylaxis, Shortness Of  Breath and Other (See Comments)    SEVERE convulsions, too   . Iodine Itching  . Sulfa Antibiotics Rash    Family history: family history includes Asthma in her brother and mother; Diabetes Mellitus I in her mother; Heart attack in her brother and father; Hyperlipidemia in her mother.  Prior to Admission medications   Medication Sig Start Date End Date Taking? Authorizing Provider  albuterol (PROVENTIL HFA;VENTOLIN  HFA) 108 (90 BASE) MCG/ACT inhaler Inhale 2 puffs into the lungs 4 (four) times daily.    Yes [provider]  amLODipine (NORVASC) 10 MG tablet Take 10 mg by mouth daily.   Yes [provider]  clobetasol cream (TEMOVATE) 3.22 % Apply 1 application topically See admin instructions. Apply a thin layer to affected areas 2 times a day as needed for irritation 02/17/16  Yes [provider]  fenofibrate 160 MG tablet Take 160 mg by mouth daily.  04/11/16  Yes [provider]  fluocinonide (LIDEX) 0.05 % external solution Apply 1 application topically See admin instructions. Apply to affected areas daily as needed for itching   Yes [provider]  glimepiride (AMARYL) 4 MG tablet Take 4 mg by mouth 2 (two) times daily.   Yes [provider]  HYDROcodone-acetaminophen (NORCO/VICODIN) 5-325 MG tablet Take 1-2 tablets by mouth daily as needed (for pain).  06/10/18  Yes [provider]  levothyroxine (SYNTHROID, LEVOTHROID) 137 MCG tablet Take 137 mcg by mouth daily before breakfast.   Yes [provider]  metFORMIN (GLUCOPHAGE-XR) 500 MG 24 hr tablet Take 1,000 mg by mouth 2 (two) times daily. 04/04/18  Yes [provider]  montelukast (SINGULAIR) 10 MG tablet Take 10 mg by mouth daily.  03/21/15  Yes [provider]  pantoprazole (PROTONIX) 40 MG tablet TAKE 1 TABLET (40 MG TOTAL) BY MOUTH DAILY. 01/26/17  Yes Irene Shipper, MD  quinapril (ACCUPRIL) 40 MG tablet Take 40 mg by mouth daily.    Yes [provider]     Physical Exam: BP (!) 147/77   Pulse 76   Temp 98 F (36.7 C) (Oral)   Resp 15   Ht 5' 1.5" (1.562 m)   Wt 68.9 kg   SpO2 97%   BMI 28.26 kg/m  General appearance: Well-developed, adult female, alert and in no acute distress.   Eyes: Anicteric, conjunctiva pink, lids and lashes normal. PERRL.    ENT: No nasal deformity, discharge, epistaxis.  Hearing normal. OP moist without lesions.    Dentition good. Skin: Warm and dry.  No jaundice.  No suspicious rashes or lesions. Cardiac: RRR, nl S1-S2, no murmurs appreciated.  Capillary refill is brisk.  JVP normal.  No LE edema.  Radial and DP pulses 2+ and symmetric.   Respiratory: Normal respiratory rate and rhythm.  CTAB without rales or wheezes. GI: Abdomen soft without rigidity.  No TTP. No ascites, distension, no hepatosplenomegaly.   MSK: No deformities or effusions. Neuro: Pupils are 4 mm and reactive to 3 mm. Cranial nerve 5 is within normal limits. Cranial nerve 7 is symmetrical. Cranial nerve 8 is within normal limits. Cranial nerves 9 and 10 reveal equal palate elevation. Cranial nerve 11 reveals sternocleidomastoid strong. Cranial nerve 12 is midline. I do not note a deficit in motor strength testing in the upper and lower extremities bilaterally with normal motor, tone and bulk. No nystagmus with head movement. Finger-to-nose testing is within normal limits. Speech is fluent. Naming is grossly intact. Attention span and  concentration are within normal limits.   Psych: The patient is oriented to time, place and person. Behavior appropriate.  Affect normal.  Recall, recent and remote, as well as general fund of knowledge seem within normal limits. No evidence of aural or visual hallucinations or delusions.       Labs on Admission:  I have personally reviewed following labs and imaging studies: CBC: Recent Labs  Lab 07/05/18 1447 07/05/18 1454  WBC 7.3  --   NEUTROABS 3.9  --   HGB 13.9 14.3  HCT 43.7 42.0  MCV 87.6  --   PLT 258  --    Basic Metabolic Panel: Recent Labs  Lab 07/05/18 1447 07/05/18 1454  NA 142 143  K 3.9 3.9  CL 108 105  CO2 27  --   GLUCOSE 123* 117*  BUN 14 15  CREATININE 0.81 0.70  CALCIUM 9.9  --    GFR: Estimated Creatinine Clearance: 57.2 mL/min (by C-G formula based on SCr of 0.7 mg/dL). Liver Function Tests: Recent Labs  Lab 07/05/18 1447  AST 19  ALT 17  ALKPHOS  41  BILITOT 0.6  PROT 7.0  ALBUMIN 4.0   No results for input(s): LIPASE, AMYLASE in the last 168 hours. No results for input(s): AMMONIA in the last 168 hours. Coagulation Profile: Recent Labs  Lab 07/05/18 1447  INR 0.93   Cardiac Enzymes: No results for input(s): CKTOTAL, CKMB, CKMBINDEX, TROPONINI in the last 168 hours. BNP (last 3 results) No results for input(s): PROBNP in the last 8760 hours. HbA1C: No results for input(s): HGBA1C in the last 72 hours. CBG: Recent Labs  Lab 07/05/18 1525  GLUCAP 117*   Lipid Profile: No results for input(s): CHOL, HDL, LDLCALC, TRIG, CHOLHDL, LDLDIRECT in the last 72 hours. Thyroid Function Tests: No results for input(s): TSH, T4TOTAL, FREET4, T3FREE, THYROIDAB in the last 72 hours. Anemia Panel: No results for input(s): VITAMINB12, FOLATE, FERRITIN, TIBC, IRON, RETICCTPCT in the last 72 hours. Sepsis Labs: Invalid input(s): PROCALCITONIN, LACTICIDVEN No results found for this or any previous visit (from the past 240 hour(s)).    Radiological Exams on Admission: CT head without contrast 07/05/2018  Unremarkable  Personally reviewed MRI reports, showed no stroke: Mr Virgel Paling WU Contrast  Result Date: 07/05/2018 CLINICAL DATA:  Diplopia, nausea, and ataxia. EXAM: MR HEAD WITHOUT CONTRAST MR CIRCLE OF WILLIS WITHOUT CONTRAST MRA OF THE NECK WITHOUT AND WITH CONTRAST TECHNIQUE: Multiplanar, multiecho pulse sequences of the brain, circle of willis and surrounding structures were obtained without intravenous contrast. Angiographic images of the neck were obtained using MRA technique without and with intravenous contrast. CONTRAST:  96mL MULTIHANCE GADOBENATE DIMEGLUMINE 529 MG/ML IV SOLN COMPARISON:  Head CT 07/05/2018 FINDINGS: MR HEAD FINDINGS Brain: There is no evidence of acute infarct, intracranial hemorrhage, mass, midline shift, or extra-axial fluid collection. The ventricles and sulci are normal. The brain is normal in signal.  Vascular: Major intracranial vascular flow voids are preserved. Skull and upper cervical spine: Unremarkable bone marrow signal. Sinuses/Orbits: Bilateral cataract extraction. Clear paranasal sinuses. Small right mastoid effusion. Other: None. MR CIRCLE OF WILLIS FINDINGS The study is mildly motion degraded. The visualized distal vertebral arteries are widely patent to the basilar. Patent PICA and SCA origins are visualized bilaterally. The basilar artery is widely patent. There are posterior communicating arteries bilaterally with a fetal origin of the right PCA noted. Both PCAs are patent with mild asymmetric attenuation of distal right PCA branch vessels but no evidence of significant  proximal stenosis. The internal carotid arteries are widely patent from skull base to carotid termini. ACAs and MCAs are patent without evidence of proximal branch occlusion or significant proximal stenosis. No aneurysm is identified. MRA NECK FINDINGS There is a standard 3 vessel aortic arch. The brachiocephalic and subclavian arteries are widely patent. The carotid arteries are patent without evidence of significant stenosis or dissection. There is less than 50% narrowing of the proximal left ICA, likely due to atherosclerotic plaque. The vertebral arteries are patent and codominant with antegrade flow bilaterally. Signal loss in the proximal V1 segments bilaterally is likely artifactual. No vertebral artery stenosis is evident more distally. IMPRESSION: 1. Unremarkable appearance of the brain.  No infarct. 2. Patent circle of Willis without proximal branch occlusion or significant stenosis. 3. Patent cervical carotid arteries without evidence of significant stenosis. 4. Patent vertebral arteries with suboptimal evaluation of the V1 segments but normal appearance more distally. Electronically Signed   By: Logan Bores M.D.   On: 07/05/2018 17:32   Mr Angiogram Neck W Or Wo Contrast  Result Date: 07/05/2018 CLINICAL DATA:   Diplopia, nausea, and ataxia. EXAM: MR HEAD WITHOUT CONTRAST MR CIRCLE OF WILLIS WITHOUT CONTRAST MRA OF THE NECK WITHOUT AND WITH CONTRAST TECHNIQUE: Multiplanar, multiecho pulse sequences of the brain, circle of willis and surrounding structures were obtained without intravenous contrast. Angiographic images of the neck were obtained using MRA technique without and with intravenous contrast. CONTRAST:  66mL MULTIHANCE GADOBENATE DIMEGLUMINE 529 MG/ML IV SOLN COMPARISON:  Head CT 07/05/2018 FINDINGS: MR HEAD FINDINGS Brain: There is no evidence of acute infarct, intracranial hemorrhage, mass, midline shift, or extra-axial fluid collection. The ventricles and sulci are normal. The brain is normal in signal. Vascular: Major intracranial vascular flow voids are preserved. Skull and upper cervical spine: Unremarkable bone marrow signal. Sinuses/Orbits: Bilateral cataract extraction. Clear paranasal sinuses. Small right mastoid effusion. Other: None. MR CIRCLE OF WILLIS FINDINGS The study is mildly motion degraded. The visualized distal vertebral arteries are widely patent to the basilar. Patent PICA and SCA origins are visualized bilaterally. The basilar artery is widely patent. There are posterior communicating arteries bilaterally with a fetal origin of the right PCA noted. Both PCAs are patent with mild asymmetric attenuation of distal right PCA branch vessels but no evidence of significant proximal stenosis. The internal carotid arteries are widely patent from skull base to carotid termini. ACAs and MCAs are patent without evidence of proximal branch occlusion or significant proximal stenosis. No aneurysm is identified. MRA NECK FINDINGS There is a standard 3 vessel aortic arch. The brachiocephalic and subclavian arteries are widely patent. The carotid arteries are patent without evidence of significant stenosis or dissection. There is less than 50% narrowing of the proximal left ICA, likely due to atherosclerotic  plaque. The vertebral arteries are patent and codominant with antegrade flow bilaterally. Signal loss in the proximal V1 segments bilaterally is likely artifactual. No vertebral artery stenosis is evident more distally. IMPRESSION: 1. Unremarkable appearance of the brain.  No infarct. 2. Patent circle of Willis without proximal branch occlusion or significant stenosis. 3. Patent cervical carotid arteries without evidence of significant stenosis. 4. Patent vertebral arteries with suboptimal evaluation of the V1 segments but normal appearance more distally. Electronically Signed   By: Logan Bores M.D.   On: 07/05/2018 17:32   Mr Brain Wo Contrast  Result Date: 07/05/2018 CLINICAL DATA:  Diplopia, nausea, and ataxia. EXAM: MR HEAD WITHOUT CONTRAST MR CIRCLE OF WILLIS WITHOUT CONTRAST MRA OF THE  NECK WITHOUT AND WITH CONTRAST TECHNIQUE: Multiplanar, multiecho pulse sequences of the brain, circle of willis and surrounding structures were obtained without intravenous contrast. Angiographic images of the neck were obtained using MRA technique without and with intravenous contrast. CONTRAST:  30mL MULTIHANCE GADOBENATE DIMEGLUMINE 529 MG/ML IV SOLN COMPARISON:  Head CT 07/05/2018 FINDINGS: MR HEAD FINDINGS Brain: There is no evidence of acute infarct, intracranial hemorrhage, mass, midline shift, or extra-axial fluid collection. The ventricles and sulci are normal. The brain is normal in signal. Vascular: Major intracranial vascular flow voids are preserved. Skull and upper cervical spine: Unremarkable bone marrow signal. Sinuses/Orbits: Bilateral cataract extraction. Clear paranasal sinuses. Small right mastoid effusion. Other: None. MR CIRCLE OF WILLIS FINDINGS The study is mildly motion degraded. The visualized distal vertebral arteries are widely patent to the basilar. Patent PICA and SCA origins are visualized bilaterally. The basilar artery is widely patent. There are posterior communicating arteries bilaterally  with a fetal origin of the right PCA noted. Both PCAs are patent with mild asymmetric attenuation of distal right PCA branch vessels but no evidence of significant proximal stenosis. The internal carotid arteries are widely patent from skull base to carotid termini. ACAs and MCAs are patent without evidence of proximal branch occlusion or significant proximal stenosis. No aneurysm is identified. MRA NECK FINDINGS There is a standard 3 vessel aortic arch. The brachiocephalic and subclavian arteries are widely patent. The carotid arteries are patent without evidence of significant stenosis or dissection. There is less than 50% narrowing of the proximal left ICA, likely due to atherosclerotic plaque. The vertebral arteries are patent and codominant with antegrade flow bilaterally. Signal loss in the proximal V1 segments bilaterally is likely artifactual. No vertebral artery stenosis is evident more distally. IMPRESSION: 1. Unremarkable appearance of the brain.  No infarct. 2. Patent circle of Willis without proximal branch occlusion or significant stenosis. 3. Patent cervical carotid arteries without evidence of significant stenosis. 4. Patent vertebral arteries with suboptimal evaluation of the V1 segments but normal appearance more distally. Electronically Signed   By: Logan Bores M.D.   On: 07/05/2018 17:32   Ct Head Code Stroke Wo Contrast  Result Date: 07/05/2018 CLINICAL DATA:  Code stroke. Gait changes. Vision changes and aphasia EXAM: CT HEAD WITHOUT CONTRAST TECHNIQUE: Contiguous axial images were obtained from the base of the skull through the vertex without intravenous contrast. COMPARISON:  None. FINDINGS: Brain: No evidence of acute infarction, hemorrhage, hydrocephalus, extra-axial collection or mass lesion/mass effect. Vascular: No hyperdense vessel.  Atherosclerotic calcification. Skull: Normal. Negative for fracture or focal lesion. Sinuses/Orbits: Bilateral cataract resection Other: These results  were communicated to Dr. Leonie Man at 3:11 pmon 8/20/2019by text page via the [facility] messaging system. ASPECTS [facility] Stroke Program Early CT Score) - Ganglionic level infarction (caudate, lentiform nuclei, internal capsule, insula, M1-M3 cortex): 7 - Supraganglionic infarction (M4-M6 cortex): 3 Total score (0-10 with 10 being normal): 10 IMPRESSION: Normal appearance of the brain. Electronically Signed   By: Monte Fantasia M.D.   On: 07/05/2018 15:12     EKG: Independently reviewed. Rate 88, QTc normal.  Diffuse TW flattening, no specific changes, no ST changes.    Assessment/Plan Active Problems:   TIA (transient ischemic attack)  1. Acute TIA:  This is new.  MRI shows no stroke.  ABCD 5, high risk. -Admit to telemetry -Neuro checks, NIHSS per protocol -Daily aspirin 325 mg -Lipids, hemoglobin A1c ordered -Carotid doppler ordered -Echocardiogram ordered -PT/OT/SLP consultation -Consult to Neurology, appreciate recommendations -Start Crestor (she  has had intolerance to Atorvastatin in the past)   2. Hypertension:  -Continue quinapril and amlodipine  3. Diabetes:  -Hold home glimepiride while NPO -Hold home metformin -SSI while in hospital -Continue home fenofibrate  4. Asthma:  Stable. No active disease -Albuterol PRN -Continue Singulair  5.  Hypothyroidism:  Stable.  -Continue home levothyroxine  6.  GERD:  -Continue home PPI      DVT prophylaxis: Lovenox  Code Status: Full code Family Communication: None present Disposition Plan: Anticipate Stroke work up as above and consult to ancillary services.  Expect discharge within 24 hours. Consults called: Neurology, Dr. Leonie Man has seen patient. Admission status: Telemetry, OBS status  Core measures: -VTE prophylaxis ordered at time of admission -Aspirin ordered at admission -Atrial fibrillation: not present -tPA not given because of symptoms resolved -Dysphagia screen ordered in ER -Lipids ordered -PT eval  ordered -Nonsmoker    Medical decision making: Patient seen at 6:33 PM on 07/05/2018.  The patient was discussed with Dr. Zenia Resides. What exists of the patient's chart was reviewed in depth and summarized above.  Clinical condition: stable.       Edwin Dada [facility] Pager [phone]

## 2018-07-06 ENCOUNTER — Observation Stay (HOSPITAL_BASED_OUTPATIENT_CLINIC_OR_DEPARTMENT_OTHER): Payer: Medicare Other

## 2018-07-06 ENCOUNTER — Other Ambulatory Visit (HOSPITAL_COMMUNITY): Payer: Medicare Other

## 2018-07-06 DIAGNOSIS — E119 Type 2 diabetes mellitus without complications: Secondary | ICD-10-CM

## 2018-07-06 DIAGNOSIS — Z794 Long term (current) use of insulin: Secondary | ICD-10-CM | POA: Diagnosis not present

## 2018-07-06 DIAGNOSIS — G459 Transient cerebral ischemic attack, unspecified: Secondary | ICD-10-CM

## 2018-07-06 DIAGNOSIS — I5030 Unspecified diastolic (congestive) heart failure: Secondary | ICD-10-CM | POA: Diagnosis not present

## 2018-07-06 DIAGNOSIS — I10 Essential (primary) hypertension: Secondary | ICD-10-CM | POA: Diagnosis not present

## 2018-07-06 LAB — GLUCOSE, CAPILLARY
GLUCOSE-CAPILLARY: 139 mg/dL — AB (ref 70–99)
Glucose-Capillary: 108 mg/dL — ABNORMAL HIGH (ref 70–99)

## 2018-07-06 LAB — ECHOCARDIOGRAM COMPLETE
HEIGHTINCHES: 61.5 in
WEIGHTICAEL: 2432 [oz_av]

## 2018-07-06 LAB — LIPID PANEL
CHOL/HDL RATIO: 4 ratio
CHOLESTEROL: 139 mg/dL (ref 0–200)
HDL: 35 mg/dL — AB (ref >40)
LDL Cholesterol: 51 mg/dL (ref 0–99)
Triglycerides: 266 mg/dL — ABNORMAL HIGH (ref <150)
VLDL: 53 mg/dL — ABNORMAL HIGH (ref 0–40)

## 2018-07-06 LAB — HEMOGLOBIN A1C
Hgb A1c MFr Bld: 6.4 % — ABNORMAL HIGH (ref 4.8–5.6)
MEAN PLASMA GLUCOSE: 136.98 mg/dL

## 2018-07-06 MED ORDER — CLOPIDOGREL BISULFATE 75 MG PO TABS: 75.0000 mg | ORAL_TABLET | Freq: Every day | ORAL | 0 refills | Status: DC

## 2018-07-06 MED ORDER — CLOPIDOGREL BISULFATE 75 MG PO TABS
75.0000 mg | ORAL_TABLET | Freq: Every day | ORAL | Status: DC
  Administered 2018-07-06: 75 mg via ORAL
  Filled 2018-07-06: qty 1

## 2018-07-06 MED ORDER — ASPIRIN 81 MG PO TBEC: 81.0000 mg | DELAYED_RELEASE_TABLET | Freq: Every day | ORAL | 0 refills | Status: DC

## 2018-07-06 MED ORDER — ROSUVASTATIN CALCIUM 20 MG PO TABS: 20.0000 mg | ORAL_TABLET | Freq: Every day | ORAL | 0 refills | Status: DC

## 2018-07-06 MED ORDER — ASPIRIN EC 81 MG PO TBEC: 81.0000 mg | DELAYED_RELEASE_TABLET | Freq: Every day | ORAL | Status: DC

## 2018-07-06 NOTE — Progress Notes (Signed)
NURSING PROGRESS NOTE  Kelly Mooney [id number] Discharge Data: 07/06/2018 4:20 PM Attending Provider: Jonetta Osgood, MD WHQ:PRFF, Dwyane Luo, MD     Elayne Guerin to be D/C'd Home per MD order.  Discussed with the patient the After Visit Summary and all questions fully answered. All IV's discontinued with no bleeding noted. All belongings returned to patient for patient to take home.   Last Vital Signs:  Blood pressure (!) 167/59, pulse 77, temperature 98.6 F (37 C), temperature source Oral, resp. rate 18, height 5' 1.5" (1.562 m), weight 68.9 kg, SpO2 98 %.  Discharge Medication List Allergies as of 07/06/2018      Reactions   Chloramphenicol Anaphylaxis   Ivp Dye [iodinated Diagnostic Agents] Anaphylaxis, Shortness Of Breath, Other (See Comments)   SEVERE convulsions, too   Iodine Itching   Sulfa Antibiotics Rash      Medication List    TAKE these medications   albuterol 108 (90 Base) MCG/ACT inhaler Commonly known as:  PROVENTIL HFA;VENTOLIN HFA Inhale 2 puffs into the lungs 4 (four) times daily.   amLODipine 10 MG tablet Commonly known as:  NORVASC Take 10 mg by mouth daily.   aspirin 81 MG EC tablet Take 1 tablet (81 mg total) by mouth daily. Aspirin and Plavix for 3 weeks, followed by aspirin alone. Start taking on:  07/07/2018   clobetasol cream 0.05 % Commonly known as:  TEMOVATE Apply 1 application topically See admin instructions. Apply a thin layer to affected areas 2 times a day as needed for irritation   clopidogrel 75 MG tablet Commonly known as:  PLAVIX Take 1 tablet (75 mg total) by mouth daily. Aspirin and Plavix for 3 weeks, followed by Aspirin alone. Start taking on:  07/07/2018   fenofibrate 160 MG tablet Take 160 mg by mouth daily.   fluocinonide 0.05 % external solution Commonly known as:  LIDEX Apply 1 application topically See admin instructions. Apply to affected areas daily as needed for itching   glimepiride 4 MG  tablet Commonly known as:  AMARYL Take 4 mg by mouth 2 (two) times daily.   HYDROcodone-acetaminophen 5-325 MG tablet Commonly known as:  NORCO/VICODIN Take 1-2 tablets by mouth daily as needed (for pain).   levothyroxine 137 MCG tablet Commonly known as:  SYNTHROID, LEVOTHROID Take 137 mcg by mouth daily before breakfast.   metFORMIN 500 MG 24 hr tablet Commonly known as:  GLUCOPHAGE-XR Take 1,000 mg by mouth 2 (two) times daily.   montelukast 10 MG tablet Commonly known as:  SINGULAIR Take 10 mg by mouth daily.   pantoprazole 40 MG tablet Commonly known as:  PROTONIX TAKE 1 TABLET (40 MG TOTAL) BY MOUTH DAILY.   quinapril 40 MG tablet Commonly known as:  ACCUPRIL Take 40 mg by mouth daily.   rosuvastatin 20 MG tablet Commonly known as:  CRESTOR Take 1 tablet (20 mg total) by mouth daily at 6 PM.

## 2018-07-06 NOTE — Discharge Summary (Signed)
PATIENT DETAILS Name: Kelly Mooney Age: 72 y.o. Sex: female Date of Birth: [birth year] MRN: [id number]. Admitting Physician: Edwin Dada, MD NGE:XBMW, Dwyane Luo, MD  Admit Date: 07/05/2018 Discharge date: 07/06/2018  Recommendations for Outpatient Follow-up:  1. Follow up with PCP in 1-2 weeks 2. Please obtain BMP/CBC in one week 3. Please ensure follow up with Neurology 4.   Admitted From:  Home  Disposition: Wann: No  Equipment/Devices: None  Discharge Condition: Stable  CODE STATUS: FULL CODE  Diet recommendation:  Heart Healthy   Brief Summary: See H&P, Labs, Consult and Test reports for all details in brief, patient with prior history of DM-2, hypertension, presented with transient diplopia.  She was admitted and underwent TIA work-up.  Brief Hospital Course: Transient ischemic attack: Presented with transient diplopia-that has since resolved.  MRI brain negative for CVA.  Telemetry without any arrhythmias.  Echocardiogram without any embolic source-preserved EF.  Carotid Doppler did not show any major stenosis.  Recommendations from neurology is to continue with aspirin/Plavix for 3 weeks, followed by aspirin alone.  Also has been started on high intensity statin.  Stable for discharge.  No therapies recommended by physical therapy.  DM-2: CBGs stable-resume oral hypoglycemics on discharge.  Hypothyroidism: Continue with levothyroxine  GERD: Continue PPI  Procedures/Studies: None  Discharge Diagnoses:  Principal Problem:   TIA (transient ischemic attack) Active Problems:   Type 2 diabetes mellitus without complication, with long-term current use of insulin (HCC)   Hypothyroidism   Essential hypertension   Discharge Instructions:  Activity:  As tolerated    Discharge Instructions    Ambulatory referral to Neurology   Complete by:  As directed    Follow up with stroke clinic NP (Jessica Vanschaick or Cecille Rubin, if both not available, consider Dr. Antony Contras, Dr. Bess Harvest, or Dr. Sarina Ill) at [facility] in about 4 weeks.     Allergies as of 07/06/2018      Reactions   Chloramphenicol Anaphylaxis   Ivp Dye [iodinated Diagnostic Agents] Anaphylaxis, Shortness Of Breath, Other (See Comments)   SEVERE convulsions, too   Iodine Itching   Sulfa Antibiotics Rash      Medication List    TAKE these medications   albuterol 108 (90 Base) MCG/ACT inhaler Commonly known as:  PROVENTIL HFA;VENTOLIN HFA Inhale 2 puffs into the lungs 4 (four) times daily.   amLODipine 10 MG tablet Commonly known as:  NORVASC Take 10 mg by mouth daily.   aspirin 81 MG EC tablet Take 1 tablet (81 mg total) by mouth daily. Aspirin and Plavix for 3 weeks, followed by aspirin alone. Start taking on:  07/07/2018   clobetasol cream 0.05 % Commonly known as:  TEMOVATE Apply 1 application topically See admin instructions. Apply a thin layer to affected areas 2 times a day as needed for irritation   clopidogrel 75 MG tablet Commonly known as:  PLAVIX Take 1 tablet (75 mg total) by mouth daily. Aspirin and Plavix for 3 weeks, followed by Aspirin alone. Start taking on:  07/07/2018   fenofibrate 160 MG tablet Take 160 mg by mouth daily.   fluocinonide 0.05 % external solution Commonly known as:  LIDEX Apply 1 application topically See admin instructions. Apply to affected areas daily as needed for itching   glimepiride 4 MG tablet Commonly known as:  AMARYL Take 4 mg by mouth 2 (two) times daily.   HYDROcodone-acetaminophen 5-325 MG tablet Commonly known as:  NORCO/VICODIN Take 1-2 tablets by mouth daily as needed (for pain).   levothyroxine 137 MCG tablet Commonly known as:  SYNTHROID, LEVOTHROID Take 137 mcg by mouth daily before breakfast.   metFORMIN 500 MG 24 hr tablet Commonly known as:  GLUCOPHAGE-XR Take 1,000 mg by mouth 2 (two) times daily.   montelukast 10 MG  tablet Commonly known as:  SINGULAIR Take 10 mg by mouth daily.   pantoprazole 40 MG tablet Commonly known as:  PROTONIX TAKE 1 TABLET (40 MG TOTAL) BY MOUTH DAILY.   quinapril 40 MG tablet Commonly known as:  ACCUPRIL Take 40 mg by mouth daily.   rosuvastatin 20 MG tablet Commonly known as:  CRESTOR Take 1 tablet (20 mg total) by mouth daily at 6 PM.      Follow-up Information    [facility] Follow up in 4 week(s).   Specialty:  Neurology Why:  stroke clinic. office will call wtih appt date and time Contact information: [address] [place] [phone]       Lawerance Cruel, MD. Schedule an appointment as soon as possible for a visit in 1 week(s).   Specialty:  Family Medicine Contact information: [place] [address] [phone]          Allergies  Allergen Reactions  . Chloramphenicol Anaphylaxis  . Ivp Dye [Iodinated Diagnostic Agents] Anaphylaxis, Shortness Of Breath and Other (See Comments)    SEVERE convulsions, too   . Iodine Itching  . Sulfa Antibiotics Rash    Consultations:   neurology   Other Procedures/Studies: Mr Virgel Paling LD Contrast  Result Date: 07/05/2018 CLINICAL DATA:  Diplopia, nausea, and ataxia. EXAM: MR HEAD WITHOUT CONTRAST MR CIRCLE OF WILLIS WITHOUT CONTRAST MRA OF THE NECK WITHOUT AND WITH CONTRAST TECHNIQUE: Multiplanar, multiecho pulse sequences of the brain, circle of willis and surrounding structures were obtained without intravenous contrast. Angiographic images of the neck were obtained using MRA technique without and with intravenous contrast. CONTRAST:  15mL MULTIHANCE GADOBENATE DIMEGLUMINE 529 MG/ML IV SOLN COMPARISON:  Head CT 07/05/2018 FINDINGS: MR HEAD FINDINGS Brain: There is no evidence of acute infarct, intracranial hemorrhage, mass, midline shift, or extra-axial fluid collection. The ventricles and sulci are normal. The brain is  normal in signal. Vascular: Major intracranial vascular flow voids are preserved. Skull and upper cervical spine: Unremarkable bone marrow signal. Sinuses/Orbits: Bilateral cataract extraction. Clear paranasal sinuses. Small right mastoid effusion. Other: None. MR CIRCLE OF WILLIS FINDINGS The study is mildly motion degraded. The visualized distal vertebral arteries are widely patent to the basilar. Patent PICA and SCA origins are visualized bilaterally. The basilar artery is widely patent. There are posterior communicating arteries bilaterally with a fetal origin of the right PCA noted. Both PCAs are patent with mild asymmetric attenuation of distal right PCA branch vessels but no evidence of significant proximal stenosis. The internal carotid arteries are widely patent from skull base to carotid termini. ACAs and MCAs are patent without evidence of proximal branch occlusion or significant proximal stenosis. No aneurysm is identified. MRA NECK FINDINGS There is a standard 3 vessel aortic arch. The brachiocephalic and subclavian arteries are widely patent. The carotid arteries are patent without evidence of significant stenosis or dissection. There is less than 50% narrowing of the proximal left ICA, likely due to atherosclerotic plaque. The vertebral arteries are patent and codominant with antegrade flow bilaterally. Signal loss in the proximal V1 segments bilaterally is likely artifactual. No vertebral artery  stenosis is evident more distally. IMPRESSION: 1. Unremarkable appearance of the brain.  No infarct. 2. Patent circle of Willis without proximal branch occlusion or significant stenosis. 3. Patent cervical carotid arteries without evidence of significant stenosis. 4. Patent vertebral arteries with suboptimal evaluation of the V1 segments but normal appearance more distally. Electronically Signed   By: Logan Bores M.D.   On: 07/05/2018 17:32   Mr Angiogram Neck W Or Wo Contrast  Result Date:  07/05/2018 CLINICAL DATA:  Diplopia, nausea, and ataxia. EXAM: MR HEAD WITHOUT CONTRAST MR CIRCLE OF WILLIS WITHOUT CONTRAST MRA OF THE NECK WITHOUT AND WITH CONTRAST TECHNIQUE: Multiplanar, multiecho pulse sequences of the brain, circle of willis and surrounding structures were obtained without intravenous contrast. Angiographic images of the neck were obtained using MRA technique without and with intravenous contrast. CONTRAST:  32mL MULTIHANCE GADOBENATE DIMEGLUMINE 529 MG/ML IV SOLN COMPARISON:  Head CT 07/05/2018 FINDINGS: MR HEAD FINDINGS Brain: There is no evidence of acute infarct, intracranial hemorrhage, mass, midline shift, or extra-axial fluid collection. The ventricles and sulci are normal. The brain is normal in signal. Vascular: Major intracranial vascular flow voids are preserved. Skull and upper cervical spine: Unremarkable bone marrow signal. Sinuses/Orbits: Bilateral cataract extraction. Clear paranasal sinuses. Small right mastoid effusion. Other: None. MR CIRCLE OF WILLIS FINDINGS The study is mildly motion degraded. The visualized distal vertebral arteries are widely patent to the basilar. Patent PICA and SCA origins are visualized bilaterally. The basilar artery is widely patent. There are posterior communicating arteries bilaterally with a fetal origin of the right PCA noted. Both PCAs are patent with mild asymmetric attenuation of distal right PCA branch vessels but no evidence of significant proximal stenosis. The internal carotid arteries are widely patent from skull base to carotid termini. ACAs and MCAs are patent without evidence of proximal branch occlusion or significant proximal stenosis. No aneurysm is identified. MRA NECK FINDINGS There is a standard 3 vessel aortic arch. The brachiocephalic and subclavian arteries are widely patent. The carotid arteries are patent without evidence of significant stenosis or dissection. There is less than 50% narrowing of the proximal left ICA,  likely due to atherosclerotic plaque. The vertebral arteries are patent and codominant with antegrade flow bilaterally. Signal loss in the proximal V1 segments bilaterally is likely artifactual. No vertebral artery stenosis is evident more distally. IMPRESSION: 1. Unremarkable appearance of the brain.  No infarct. 2. Patent circle of Willis without proximal branch occlusion or significant stenosis. 3. Patent cervical carotid arteries without evidence of significant stenosis. 4. Patent vertebral arteries with suboptimal evaluation of the V1 segments but normal appearance more distally. Electronically Signed   By: Logan Bores M.D.   On: 07/05/2018 17:32   Mr Brain Wo Contrast  Result Date: 07/05/2018 CLINICAL DATA:  Diplopia, nausea, and ataxia. EXAM: MR HEAD WITHOUT CONTRAST MR CIRCLE OF WILLIS WITHOUT CONTRAST MRA OF THE NECK WITHOUT AND WITH CONTRAST TECHNIQUE: Multiplanar, multiecho pulse sequences of the brain, circle of willis and surrounding structures were obtained without intravenous contrast. Angiographic images of the neck were obtained using MRA technique without and with intravenous contrast. CONTRAST:  43mL MULTIHANCE GADOBENATE DIMEGLUMINE 529 MG/ML IV SOLN COMPARISON:  Head CT 07/05/2018 FINDINGS: MR HEAD FINDINGS Brain: There is no evidence of acute infarct, intracranial hemorrhage, mass, midline shift, or extra-axial fluid collection. The ventricles and sulci are normal. The brain is normal in signal. Vascular: Major intracranial vascular flow voids are preserved. Skull and upper cervical spine: Unremarkable bone marrow signal. Sinuses/Orbits: Bilateral  cataract extraction. Clear paranasal sinuses. Small right mastoid effusion. Other: None. MR CIRCLE OF WILLIS FINDINGS The study is mildly motion degraded. The visualized distal vertebral arteries are widely patent to the basilar. Patent PICA and SCA origins are visualized bilaterally. The basilar artery is widely patent. There are posterior  communicating arteries bilaterally with a fetal origin of the right PCA noted. Both PCAs are patent with mild asymmetric attenuation of distal right PCA branch vessels but no evidence of significant proximal stenosis. The internal carotid arteries are widely patent from skull base to carotid termini. ACAs and MCAs are patent without evidence of proximal branch occlusion or significant proximal stenosis. No aneurysm is identified. MRA NECK FINDINGS There is a standard 3 vessel aortic arch. The brachiocephalic and subclavian arteries are widely patent. The carotid arteries are patent without evidence of significant stenosis or dissection. There is less than 50% narrowing of the proximal left ICA, likely due to atherosclerotic plaque. The vertebral arteries are patent and codominant with antegrade flow bilaterally. Signal loss in the proximal V1 segments bilaterally is likely artifactual. No vertebral artery stenosis is evident more distally. IMPRESSION: 1. Unremarkable appearance of the brain.  No infarct. 2. Patent circle of Willis without proximal branch occlusion or significant stenosis. 3. Patent cervical carotid arteries without evidence of significant stenosis. 4. Patent vertebral arteries with suboptimal evaluation of the V1 segments but normal appearance more distally. Electronically Signed   By: Logan Bores M.D.   On: 07/05/2018 17:32   Ct Head Code Stroke Wo Contrast  Result Date: 07/05/2018 CLINICAL DATA:  Code stroke. Gait changes. Vision changes and aphasia EXAM: CT HEAD WITHOUT CONTRAST TECHNIQUE: Contiguous axial images were obtained from the base of the skull through the vertex without intravenous contrast. COMPARISON:  None. FINDINGS: Brain: No evidence of acute infarction, hemorrhage, hydrocephalus, extra-axial collection or mass lesion/mass effect. Vascular: No hyperdense vessel.  Atherosclerotic calcification. Skull: Normal. Negative for fracture or focal lesion. Sinuses/Orbits: Bilateral  cataract resection Other: These results were communicated to Dr. Leonie Man at 3:11 pmon 8/20/2019by text page via the [facility] messaging system. ASPECTS [facility] Stroke Program Early CT Score) - Ganglionic level infarction (caudate, lentiform nuclei, internal capsule, insula, M1-M3 cortex): 7 - Supraganglionic infarction (M4-M6 cortex): 3 Total score (0-10 with 10 being normal): 10 IMPRESSION: Normal appearance of the brain. Electronically Signed   By: Monte Fantasia M.D.   On: 07/05/2018 15:12      TODAY-DAY OF DISCHARGE:  Subjective:   Kelly Mooney today has no headache,no chest abdominal pain,no new weakness tingling or numbness, feels much better wants to go home today.   Objective:   Blood pressure (!) 167/59, pulse 77, temperature 98.6 F (37 C), temperature source Oral, resp. rate 18, height 5' 1.5" (1.562 m), weight 68.9 kg, SpO2 98 %.  Intake/Output Summary (Last 24 hours) at 07/06/2018 1417 Last data filed at 07/06/2018 1200 Gross per 24 hour  Intake 120 ml  Output -  Net 120 ml   Filed Weights   07/05/18 1435  Weight: 68.9 kg    Exam: Awake Alert, Oriented *3, No new F.N deficits, Normal affect [place].AT,PERRAL Supple Neck,No JVD, No cervical lymphadenopathy appriciated.  Symmetrical Chest wall movement, Good air movement bilaterally, CTAB RRR,No Gallops,Rubs or new Murmurs, No Parasternal Heave +ve B.Sounds, Abd Soft, Non tender, No organomegaly appriciated, No rebound -guarding or rigidity. No Cyanosis, Clubbing or edema, No new Rash or bruise   PERTINENT RADIOLOGIC STUDIES: Mr Virgel Paling Wo Contrast  Result Date: 07/05/2018 CLINICAL  DATA:  Diplopia, nausea, and ataxia. EXAM: MR HEAD WITHOUT CONTRAST MR CIRCLE OF WILLIS WITHOUT CONTRAST MRA OF THE NECK WITHOUT AND WITH CONTRAST TECHNIQUE: Multiplanar, multiecho pulse sequences of the brain, circle of willis and surrounding structures were obtained without intravenous contrast. Angiographic images of the neck were obtained  using MRA technique without and with intravenous contrast. CONTRAST:  59mL MULTIHANCE GADOBENATE DIMEGLUMINE 529 MG/ML IV SOLN COMPARISON:  Head CT 07/05/2018 FINDINGS: MR HEAD FINDINGS Brain: There is no evidence of acute infarct, intracranial hemorrhage, mass, midline shift, or extra-axial fluid collection. The ventricles and sulci are normal. The brain is normal in signal. Vascular: Major intracranial vascular flow voids are preserved. Skull and upper cervical spine: Unremarkable bone marrow signal. Sinuses/Orbits: Bilateral cataract extraction. Clear paranasal sinuses. Small right mastoid effusion. Other: None. MR CIRCLE OF WILLIS FINDINGS The study is mildly motion degraded. The visualized distal vertebral arteries are widely patent to the basilar. Patent PICA and SCA origins are visualized bilaterally. The basilar artery is widely patent. There are posterior communicating arteries bilaterally with a fetal origin of the right PCA noted. Both PCAs are patent with mild asymmetric attenuation of distal right PCA branch vessels but no evidence of significant proximal stenosis. The internal carotid arteries are widely patent from skull base to carotid termini. ACAs and MCAs are patent without evidence of proximal branch occlusion or significant proximal stenosis. No aneurysm is identified. MRA NECK FINDINGS There is a standard 3 vessel aortic arch. The brachiocephalic and subclavian arteries are widely patent. The carotid arteries are patent without evidence of significant stenosis or dissection. There is less than 50% narrowing of the proximal left ICA, likely due to atherosclerotic plaque. The vertebral arteries are patent and codominant with antegrade flow bilaterally. Signal loss in the proximal V1 segments bilaterally is likely artifactual. No vertebral artery stenosis is evident more distally. IMPRESSION: 1. Unremarkable appearance of the brain.  No infarct. 2. Patent circle of Willis without proximal branch  occlusion or significant stenosis. 3. Patent cervical carotid arteries without evidence of significant stenosis. 4. Patent vertebral arteries with suboptimal evaluation of the V1 segments but normal appearance more distally. Electronically Signed   By: Logan Bores M.D.   On: 07/05/2018 17:32   Mr Angiogram Neck W Or Wo Contrast  Result Date: 07/05/2018 CLINICAL DATA:  Diplopia, nausea, and ataxia. EXAM: MR HEAD WITHOUT CONTRAST MR CIRCLE OF WILLIS WITHOUT CONTRAST MRA OF THE NECK WITHOUT AND WITH CONTRAST TECHNIQUE: Multiplanar, multiecho pulse sequences of the brain, circle of willis and surrounding structures were obtained without intravenous contrast. Angiographic images of the neck were obtained using MRA technique without and with intravenous contrast. CONTRAST:  75mL MULTIHANCE GADOBENATE DIMEGLUMINE 529 MG/ML IV SOLN COMPARISON:  Head CT 07/05/2018 FINDINGS: MR HEAD FINDINGS Brain: There is no evidence of acute infarct, intracranial hemorrhage, mass, midline shift, or extra-axial fluid collection. The ventricles and sulci are normal. The brain is normal in signal. Vascular: Major intracranial vascular flow voids are preserved. Skull and upper cervical spine: Unremarkable bone marrow signal. Sinuses/Orbits: Bilateral cataract extraction. Clear paranasal sinuses. Small right mastoid effusion. Other: None. MR CIRCLE OF WILLIS FINDINGS The study is mildly motion degraded. The visualized distal vertebral arteries are widely patent to the basilar. Patent PICA and SCA origins are visualized bilaterally. The basilar artery is widely patent. There are posterior communicating arteries bilaterally with a fetal origin of the right PCA noted. Both PCAs are patent with mild asymmetric attenuation of distal right PCA branch vessels but no  evidence of significant proximal stenosis. The internal carotid arteries are widely patent from skull base to carotid termini. ACAs and MCAs are patent without evidence of proximal  branch occlusion or significant proximal stenosis. No aneurysm is identified. MRA NECK FINDINGS There is a standard 3 vessel aortic arch. The brachiocephalic and subclavian arteries are widely patent. The carotid arteries are patent without evidence of significant stenosis or dissection. There is less than 50% narrowing of the proximal left ICA, likely due to atherosclerotic plaque. The vertebral arteries are patent and codominant with antegrade flow bilaterally. Signal loss in the proximal V1 segments bilaterally is likely artifactual. No vertebral artery stenosis is evident more distally. IMPRESSION: 1. Unremarkable appearance of the brain.  No infarct. 2. Patent circle of Willis without proximal branch occlusion or significant stenosis. 3. Patent cervical carotid arteries without evidence of significant stenosis. 4. Patent vertebral arteries with suboptimal evaluation of the V1 segments but normal appearance more distally. Electronically Signed   By: Logan Bores M.D.   On: 07/05/2018 17:32   Mr Brain Wo Contrast  Result Date: 07/05/2018 CLINICAL DATA:  Diplopia, nausea, and ataxia. EXAM: MR HEAD WITHOUT CONTRAST MR CIRCLE OF WILLIS WITHOUT CONTRAST MRA OF THE NECK WITHOUT AND WITH CONTRAST TECHNIQUE: Multiplanar, multiecho pulse sequences of the brain, circle of willis and surrounding structures were obtained without intravenous contrast. Angiographic images of the neck were obtained using MRA technique without and with intravenous contrast. CONTRAST:  56mL MULTIHANCE GADOBENATE DIMEGLUMINE 529 MG/ML IV SOLN COMPARISON:  Head CT 07/05/2018 FINDINGS: MR HEAD FINDINGS Brain: There is no evidence of acute infarct, intracranial hemorrhage, mass, midline shift, or extra-axial fluid collection. The ventricles and sulci are normal. The brain is normal in signal. Vascular: Major intracranial vascular flow voids are preserved. Skull and upper cervical spine: Unremarkable bone marrow signal. Sinuses/Orbits: Bilateral  cataract extraction. Clear paranasal sinuses. Small right mastoid effusion. Other: None. MR CIRCLE OF WILLIS FINDINGS The study is mildly motion degraded. The visualized distal vertebral arteries are widely patent to the basilar. Patent PICA and SCA origins are visualized bilaterally. The basilar artery is widely patent. There are posterior communicating arteries bilaterally with a fetal origin of the right PCA noted. Both PCAs are patent with mild asymmetric attenuation of distal right PCA branch vessels but no evidence of significant proximal stenosis. The internal carotid arteries are widely patent from skull base to carotid termini. ACAs and MCAs are patent without evidence of proximal branch occlusion or significant proximal stenosis. No aneurysm is identified. MRA NECK FINDINGS There is a standard 3 vessel aortic arch. The brachiocephalic and subclavian arteries are widely patent. The carotid arteries are patent without evidence of significant stenosis or dissection. There is less than 50% narrowing of the proximal left ICA, likely due to atherosclerotic plaque. The vertebral arteries are patent and codominant with antegrade flow bilaterally. Signal loss in the proximal V1 segments bilaterally is likely artifactual. No vertebral artery stenosis is evident more distally. IMPRESSION: 1. Unremarkable appearance of the brain.  No infarct. 2. Patent circle of Willis without proximal branch occlusion or significant stenosis. 3. Patent cervical carotid arteries without evidence of significant stenosis. 4. Patent vertebral arteries with suboptimal evaluation of the V1 segments but normal appearance more distally. Electronically Signed   By: Logan Bores M.D.   On: 07/05/2018 17:32   Ct Head Code Stroke Wo Contrast  Result Date: 07/05/2018 CLINICAL DATA:  Code stroke. Gait changes. Vision changes and aphasia EXAM: CT HEAD WITHOUT CONTRAST TECHNIQUE: Contiguous axial  images were obtained from the base of the skull  through the vertex without intravenous contrast. COMPARISON:  None. FINDINGS: Brain: No evidence of acute infarction, hemorrhage, hydrocephalus, extra-axial collection or mass lesion/mass effect. Vascular: No hyperdense vessel.  Atherosclerotic calcification. Skull: Normal. Negative for fracture or focal lesion. Sinuses/Orbits: Bilateral cataract resection Other: These results were communicated to Dr. Leonie Man at 3:11 pmon 8/20/2019by text page via the [facility] messaging system. ASPECTS [facility] Stroke Program Early CT Score) - Ganglionic level infarction (caudate, lentiform nuclei, internal capsule, insula, M1-M3 cortex): 7 - Supraganglionic infarction (M4-M6 cortex): 3 Total score (0-10 with 10 being normal): 10 IMPRESSION: Normal appearance of the brain. Electronically Signed   By: Monte Fantasia M.D.   On: 07/05/2018 15:12     PERTINENT LAB RESULTS: CBC: Recent Labs    07/05/18 1447 07/05/18 1454  WBC 7.3  --   HGB 13.9 14.3  HCT 43.7 42.0  PLT 258  --    CMET CMP     Component Value Date/Time   NA 143 07/05/2018 1454   K 3.9 07/05/2018 1454   CL 105 07/05/2018 1454   CO2 27 07/05/2018 1447   GLUCOSE 117 (H) 07/05/2018 1454   BUN 15 07/05/2018 1454   CREATININE 0.70 07/05/2018 1454   CALCIUM 9.9 07/05/2018 1447   PROT 7.0 07/05/2018 1447   ALBUMIN 4.0 07/05/2018 1447   AST 19 07/05/2018 1447   ALT 17 07/05/2018 1447   ALKPHOS 41 07/05/2018 1447   BILITOT 0.6 07/05/2018 1447   GFRNONAA >60 07/05/2018 1447   GFRAA >60 07/05/2018 1447    GFR Estimated Creatinine Clearance: 57.2 mL/min (by C-G formula based on SCr of 0.7 mg/dL). No results for input(s): LIPASE, AMYLASE in the last 72 hours. No results for input(s): CKTOTAL, CKMB, CKMBINDEX, TROPONINI in the last 72 hours. Invalid input(s): POCBNP No results for input(s): DDIMER in the last 72 hours. Recent Labs    07/06/18 0505  HGBA1C 6.4*   Recent Labs    07/06/18 0505  CHOL 139  HDL 35*  LDLCALC 51  TRIG 266*    CHOLHDL 4.0   No results for input(s): TSH, T4TOTAL, T3FREE, THYROIDAB in the last 72 hours.  Invalid input(s): FREET3 No results for input(s): VITAMINB12, FOLATE, FERRITIN, TIBC, IRON, RETICCTPCT in the last 72 hours. Coags: Recent Labs    07/05/18 1447  INR 0.93   Microbiology: No results found for this or any previous visit (from the past 240 hour(s)).  FURTHER DISCHARGE INSTRUCTIONS:  Get Medicines reviewed and adjusted: Please take all your medications with you for your next visit with your Primary MD  Laboratory/radiological data: Please request your Primary MD to go over all hospital tests and procedure/radiological results at the follow up, please ask your Primary MD to get all Hospital records sent to his/her office.  In some cases, they will be blood work, cultures and biopsy results pending at the time of your discharge. Please request that your primary care M.D. goes through all the records of your hospital data and follows up on these results.  Also Note the following: If you experience worsening of your admission symptoms, develop shortness of breath, life threatening emergency, suicidal or homicidal thoughts you must seek medical attention immediately by calling 911 or calling your MD immediately  if symptoms less severe.  You must read complete instructions/literature along with all the possible adverse reactions/side effects for all the Medicines you take and that have been prescribed to you. Take any new Medicines  after you have completely understood and accpet all the possible adverse reactions/side effects.   Do not drive when taking Pain medications or sleeping medications (Benzodaizepines)  Do not take more than prescribed Pain, Sleep and Anxiety Medications. It is not advisable to combine anxiety,sleep and pain medications without talking with your primary care practitioner  Special Instructions: If you have smoked or chewed Tobacco  in the last 2 yrs please  stop smoking, stop any regular Alcohol  and or any Recreational drug use.  Wear Seat belts while driving.  Please note: You were cared for by a hospitalist during your hospital stay. Once you are discharged, your primary care physician will handle any further medical issues. Please note that NO REFILLS for any discharge medications will be authorized once you are discharged, as it is imperative that you return to your primary care physician (or establish a relationship with a primary care physician if you do not have one) for your post hospital discharge needs so that they can reassess your need for medications and monitor your lab values.  Total Time spent coordinating discharge including counseling, education and face to face time equals 45 minutes.  SignedOren Binet 07/06/2018 2:17 PM

## 2018-07-06 NOTE — Evaluation (Signed)
Physical Therapy Evaluation Patient Details Name: Kelly Mooney MRN: [id number] DOB: August 26, 1946 Today's Date: 07/06/2018   History of Present Illness  Kelly Mooney is a 72 y.o. female with a past medical history significant for NIDDM, HTN, GERD, asthma, hypothyroidism and chronic diarrhea who presents with transient diplopia, ataxia, and slurred speech. CT/MRI negative for infarct  Clinical Impression  Orders received for PT evaluation. Patient demonstrates no focal deficits in functional mobility. Mobilizing very well. Educated on [place]. Patient receptive and appreciative. No further needs, will sign off.    Follow Up Recommendations No PT follow up    Equipment Recommendations  None recommended by PT    Recommendations for Other Services       Precautions / Restrictions        Mobility  Bed Mobility Overal bed mobility: Independent                Transfers Overall transfer level: Independent                  Ambulation/Gait Ambulation/Gait assistance: Independent Gait Distance (Feet): 380 Feet Assistive device: None Gait Pattern/deviations: WFL(Within Functional Limits)        Stairs            Wheelchair Mobility    Modified Rankin (Stroke Patients Only) Modified Rankin (Stroke Patients Only) Pre-Morbid Rankin Score: No symptoms Modified Rankin: No symptoms     Balance Overall balance assessment: Independent                               Standardized Balance Assessment Standardized Balance Assessment : Dynamic Gait Index   Dynamic Gait Index Level Surface: Normal Change in Gait Speed: Normal Gait with Horizontal Head Turns: Normal Gait with Vertical Head Turns: Normal Gait and Pivot Turn: Normal Step Over Obstacle: Normal Step Around Obstacles: Normal Steps: Normal Total Score: 24       Pertinent Vitals/Pain Pain Assessment: No/denies pain    Home Living Family/patient expects to be discharged to::  Private residence Living Arrangements: Alone Available Help at Discharge: Family;Available PRN/intermittently Type of Home: House Home Access: Level entry     Home Layout: One level Home Equipment: Shower seat - built in;Walker - 4 wheels;Grab bars - tub/shower;Hand held shower head      Prior Function Level of Independence: Independent         Comments: driving, works part time at [employer] recie[employer]     Journalist, newspaper        Extremity/Trunk Assessment   Upper Extremity Assessment Upper Extremity Assessment: Overall WFL for tasks assessed    Lower Extremity Assessment Lower Extremity Assessment: Overall WFL for tasks assessed       Communication   Communication: No difficulties  Cognition Arousal/Alertness: Awake/alert Behavior During Therapy: WFL for tasks assessed/performed Overall Cognitive Status: Within Functional Limits for tasks assessed                                        General Comments      Exercises     Assessment/Plan    PT Assessment Patent does not need any further PT services  PT Problem List         PT Treatment Interventions      PT Goals (Current goals can be found in the Care Plan section)  Acute Rehab  PT Goals PT Goal Formulation: All assessment and education complete, DC therapy    Frequency     Barriers to discharge        Co-evaluation               AM-PAC PT "6 Clicks" Daily Activity  Outcome Measure Difficulty turning over in bed (including adjusting bedclothes, sheets and blankets)?: None Difficulty moving from lying on back to sitting on the side of the bed? : None Difficulty sitting down on and standing up from a chair with arms (e.g., wheelchair, bedside commode, etc,.)?: None Help needed moving to and from a bed to chair (including a wheelchair)?: None Help needed walking in hospital room?: None Help needed climbing 3-5 steps with a railing? : None 6 Click Score: 24    End of  Session Equipment Utilized During Treatment: Gait belt Activity Tolerance: Patient tolerated treatment well Patient left: in bed(sitting EOB) Nurse Communication: Mobility status PT Visit Diagnosis: Other symptoms and signs involving the nervous system (R29.898)    Time: 1610-9604 PT Time Calculation (min) (ACUTE ONLY): 15 min   Charges:   PT Evaluation $PT Eval Low Complexity: [place], PT DPT  Board Certified Neurologic Specialist [place] 07/06/2018, 12:08 PM

## 2018-07-06 NOTE — Progress Notes (Signed)
OT Cancellation Note  Patient Details Name: Kelly Mooney MRN: [id number] DOB: 1946/05/26   Cancelled Treatment:    Reason Eval/Treat Not Completed: Patient at procedure or test/ unavailable. OT will continue to follow as schedule allows  Jaci Carrel 07/06/2018, 9:19 AM  Hulda Humphrey OTR/L [phone]

## 2018-07-06 NOTE — Care Management Note (Signed)
Case Management Note  Patient Details  Name: Kelly Mooney MRN: [id number] Date of Birth: 05/05/1946  Subjective/Objective:      Pt in with TIA. She is from home alone. She states she has no transportation issues and no problems obtaining her medications. PCP:    Dr Harrington Challenger           Action/Plan: Pt discharging home with self care. Pt has transportation home.   Expected Discharge Date:  07/06/18               Expected Discharge Plan:  Home/Self Care  In-House Referral:     Discharge planning Services     Post Acute Care Choice:    Choice offered to:     DME Arranged:    DME Agency:     HH Arranged:    HH Agency:     Status of Service:  Completed, signed off  If discussed at [employer] of Stay Meetings, dates discussed:    Additional Comments:  Pollie Friar, RN 07/06/2018, 4:16 PM

## 2018-07-06 NOTE — Evaluation (Signed)
Speech Language Pathology Evaluation Patient Details Name: Kelly Mooney MRN: [id number] DOB: Nov 19, 1945 Today's Date: 07/06/2018 Time: 9373-4287 SLP Time Calculation (min) (ACUTE ONLY): 18 min  Problem List:  Patient Active Problem List   Diagnosis Date Noted  . TIA (transient ischemic attack) 07/05/2018  . Type 2 diabetes mellitus without complication, with long-term current use of insulin ([place]) 07/05/2018  . Hypothyroidism 07/05/2018  . Essential hypertension 07/05/2018   Past Medical History:  Past Medical History:  Diagnosis Date  . Asthma    since age 68  . Colon polyps   . COPD (chronic obstructive pulmonary disease) ([place])   . Diabetes mellitus   . Diverticulosis   . Esophageal stricture   . GERD (gastroesophageal reflux disease)   . Hiatal hernia   . Hypercholesteremia   . Hypertension   . Paraesophageal hernia   . Thyroid disease    Past Surgical History:  Past Surgical History:  Procedure Laterality Date  . BUNIONECTOMY  10/2012   right foot  . CHOLECYSTECTOMY  1980  . ESOPHAGEAL MANOMETRY N/A 07/15/2015   Procedure: ESOPHAGEAL MANOMETRY (EM);  Surgeon: Irene Shipper, MD;  Location: [facility];  Service: Endoscopy;  Laterality: N/A;  . partail hysterectomy    . RHINOPLASTY     nasal polyps removed dr. Barrie Folk   HPI:  Kelly Mooney is a 72 y.o. female with a past medical history significant for NIDDM, HTN, GERD, asthma, hypothyroidism and chronic diarrhea who presents with transient diplopia while driving.   Assessment / Plan / Recommendation Clinical Impression   Pt presents with grossly intact cognitive-linguistic function.  Pt's speech is fluent and free from dysarthria or word finding impairment.  Pt scored 25/30 on the [facility] standardized cognitive assessment (n>/=26, pt felt that results were as expected).  Pt demonstrated appropriate safety awareness, recall of daily information, and attention to tasks.  Pt feels that speech, language, and cognition  are all returned to baseline.  As a result, no further ST needs are indicated at this time.  Did provide pt with education that OP ST services are available should she have any cognitive-linguistic changes in the future or lingering complaints of "fuzzyheadedness" as she currently reports.      SLP Assessment  SLP Recommendation/Assessment: Patient does not need any further Speech Lanaguage Pathology Services    Follow Up Recommendations  None    Frequency and Duration           SLP Evaluation Cognition  Overall Cognitive Status: Within Functional Limits for tasks assessed       Comprehension  Auditory Comprehension Overall Auditory Comprehension: Appears within functional limits for tasks assessed    Expression Expression Primary Mode of Expression: Verbal Verbal Expression Overall Verbal Expression: Appears within functional limits for tasks assessed   Oral / Motor  Oral Motor/Sensory Function Overall Oral Motor/Sensory Function: Within functional limits Motor Speech Overall Motor Speech: Appears within functional limits for tasks assessed   GO                    Emilio Math 07/06/2018, 8:56 AM

## 2018-07-06 NOTE — Progress Notes (Signed)
*  PRELIMINARY RESULTS* Vascular Ultrasound Carotid Duplex (Doppler) has been completed. Findings suggest 1-39% internal carotid artery stenosis bilaterally. Vertebral arteries are patent with antegrade flow.  07/06/2018 10:30 AM Maudry Mayhew, MHA, RVT, RDCS, RDMS

## 2018-07-06 NOTE — Progress Notes (Signed)
  Echocardiogram 2D Echocardiogram has been performed.  Johny Chess 07/06/2018, 10:17 AM

## 2018-07-06 NOTE — Evaluation (Signed)
Occupational Therapy Evaluation and DC from OT Patient Details Name: Kelly Mooney MRN: [id number] DOB: [birth year] Today's Date: 07/06/2018    History of Present Illness Kelly Mooney is a 72 y.o. female with a past medical history significant for NIDDM, HTN, GERD, asthma, hypothyroidism and chronic diarrhea who presents with transient diplopia, ataxia, and slurred speech. CT/MRI negative for infarct   Clinical Impression   Pt is independent in ADL and functional transfers. All visual deficits have resolved. Education for vision and compensatory strategies complete. Pt and daughter with no further questions or concerns. Thank you for the opportunity to serve this patient.     Follow Up Recommendations  No OT follow up    Equipment Recommendations  None recommended by OT    Recommendations for Other Services       Precautions / Restrictions Restrictions Weight Bearing Restrictions: No      Mobility Bed Mobility Overal bed mobility: Independent                Transfers Overall transfer level: Independent                    Balance Overall balance assessment: Independent                               Standardized Balance Assessment Standardized Balance Assessment : Dynamic Gait Index   Dynamic Gait Index Level Surface: Normal Change in Gait Speed: Normal Gait with Horizontal Head Turns: Normal Gait with Vertical Head Turns: Normal Gait and Pivot Turn: Normal Step Over Obstacle: Normal Step Around Obstacles: Normal Steps: Normal Total Score: 24     ADL either performed or assessed with clinical judgement   ADL Overall ADL's : Independent                                             Vision Baseline Vision/History: Wears glasses Wears Glasses: Reading only Patient Visual Report: No change from baseline(Diplopia has resolved) Vision Assessment?: Yes Eye Alignment: Within Functional Limits Ocular Range of  Motion: Within Functional Limits Alignment/Gaze Preference: Within Defined Limits Tracking/Visual Pursuits: Able to track stimulus in all quads without difficulty Convergence: Within functional limits Visual Fields: No apparent deficits Diplopia Assessment: Other (comment)(resolved) Additional Comments: educated on cause of double vision and in an emergency situation (like driving) to close one eye and it should eliminate the double vision, get over safely ASAP and call for help and medical attention     Perception     Praxis      Pertinent Vitals/Pain Pain Assessment: No/denies pain     Hand Dominance     Extremity/Trunk Assessment Upper Extremity Assessment Upper Extremity Assessment: Overall WFL for tasks assessed   Lower Extremity Assessment Lower Extremity Assessment: Overall WFL for tasks assessed       Communication Communication Communication: No difficulties   Cognition Arousal/Alertness: Awake/alert Behavior During Therapy: WFL for tasks assessed/performed Overall Cognitive Status: Within Functional Limits for tasks assessed                                     General Comments  daughter present throughout session    Exercises     Shoulder Instructions      Home  Living Family/patient expects to be discharged to:: Private residence Living Arrangements: Alone Available Help at Discharge: Family;Available PRN/intermittently Type of Home: House Home Access: Level entry     Home Layout: One level     Bathroom Shower/Tub: Occupational psychologist: Handicapped height Bathroom Accessibility: Yes   Home Equipment: Shower seat - built in;Walker - 4 wheels;Grab bars - tub/shower;Hand held shower head          Prior Functioning/Environment Level of Independence: Independent        Comments: driving, works part time at [employer]        OT Problem List:        OT Treatment/Interventions:      OT Goals(Current  goals can be found in the care plan section) Acute Rehab OT Goals Patient Stated Goal: to be safe and get home OT Goal Formulation: With patient/family Time For Goal Achievement: 07/16/18  OT Frequency:     Barriers to D/C:            Co-evaluation              AM-PAC PT "6 Clicks" Daily Activity     Outcome Measure Help from another person eating meals?: None Help from another person taking care of personal grooming?: None Help from another person toileting, which includes using toliet, bedpan, or urinal?: None Help from another person bathing (including washing, rinsing, drying)?: None Help from another person to put on and taking off regular upper body clothing?: None Help from another person to put on and taking off regular lower body clothing?: None 6 Click Score: 24   End of Session Nurse Communication: Mobility status;Other (comment)(signing off)  Activity Tolerance: Patient tolerated treatment well Patient left: in bed;with family/visitor present;Other (comment)(MD present)                   Time: 1040-1102 OT Time Calculation (min): 22 min Charges:  OT General Charges $OT Visit: 1 Visit OT Evaluation $OT Eval Low Complexity: 1 Low  Kelly Mooney OTR/L [place] 07/06/2018, 2:35 PM

## 2018-07-06 NOTE — Progress Notes (Addendum)
STROKE TEAM PROGRESS NOTE  HPI:  72 year old female, she presents after developing what she describes as diplopia that started yesterday while she was driving her vehicle. She had a hard time seeing straight and felt like she was seeing double of the car in front of her, the road lines, the seem to get better when she parked and got to a friend's house, she then got back in the car and felt like it was occurring again, ultimately she had another person drive her home. This morning when she tried to drive she again developed  diplopia, nausea and gait ataxia when she started driving again.CBG was 163 mg% at home. she went to the ophthalmology office and saw Dr Kathlen Mody who found mild upgaze restriction only on exam, and  sent her to the emergency department for stroke work-up and an emergent MRI. She denies headache, vertigo, numbness, focal weakness or speech difficulties. No prior h/o stroke or TIA or neurological problems. LSN 11 am 07/05/18 TPA no due to resolution of symptoms MRS baseline 0  INTERVAL HISTORY Her daughter is at the bedside along with therapy.  She remains stable. No stroke, TIA dx.  Vitals:   07/06/18 0241 07/06/18 0405 07/06/18 0441 07/06/18 0800  BP: (!) 119/55 (!) 142/51 133/62 (!) 155/56  Pulse: 72 64 64 81  Resp: 18 18 18 18   Temp: 98.1 F (36.7 C) 98.1 F (36.7 C) 98.1 F (36.7 C)   TempSrc: Oral Oral Oral   SpO2: 95% 97% 96% 96%  Weight:      Height:        CBC:  Recent Labs  Lab 07/05/18 1447 07/05/18 1454  WBC 7.3  --   NEUTROABS 3.9  --   HGB 13.9 14.3  HCT 43.7 42.0  MCV 87.6  --   PLT 258  --     Basic Metabolic Panel:  Recent Labs  Lab 07/05/18 1447 07/05/18 1454  NA 142 143  K 3.9 3.9  CL 108 105  CO2 27  --   GLUCOSE 123* 117*  BUN 14 15  CREATININE 0.81 0.70  CALCIUM 9.9  --    Lipid Panel:     Component Value Date/Time   CHOL 139 07/06/2018 0505   TRIG 266 (H) 07/06/2018 0505   HDL 35 (L) 07/06/2018 0505   CHOLHDL 4.0  07/06/2018 0505   VLDL 53 (H) 07/06/2018 0505   LDLCALC 51 07/06/2018 0505   HgbA1c:  Lab Results  Component Value Date   HGBA1C 6.4 (H) 07/06/2018   Urine Drug Screen: No results found for: LABOPIA, COCAINSCRNUR, LABBENZ, AMPHETMU, THCU, LABBARB  Alcohol Level No results found for: [facility]  IMAGING Mr Jodene Nam Head Wo Contrast  Result Date: 07/05/2018 CLINICAL DATA:  Diplopia, nausea, and ataxia. EXAM: MR HEAD WITHOUT CONTRAST MR CIRCLE OF WILLIS WITHOUT CONTRAST MRA OF THE NECK WITHOUT AND WITH CONTRAST TECHNIQUE: Multiplanar, multiecho pulse sequences of the brain, circle of willis and surrounding structures were obtained without intravenous contrast. Angiographic images of the neck were obtained using MRA technique without and with intravenous contrast. CONTRAST:  78mL MULTIHANCE GADOBENATE DIMEGLUMINE 529 MG/ML IV SOLN COMPARISON:  Head CT 07/05/2018 FINDINGS: MR HEAD FINDINGS Brain: There is no evidence of acute infarct, intracranial hemorrhage, mass, midline shift, or extra-axial fluid collection. The ventricles and sulci are normal. The brain is normal in signal. Vascular: Major intracranial vascular flow voids are preserved. Skull and upper cervical spine: Unremarkable bone marrow signal. Sinuses/Orbits: Bilateral cataract extraction. Clear paranasal sinuses.  Small right mastoid effusion. Other: None. MR CIRCLE OF WILLIS FINDINGS The study is mildly motion degraded. The visualized distal vertebral arteries are widely patent to the basilar. Patent PICA and SCA origins are visualized bilaterally. The basilar artery is widely patent. There are posterior communicating arteries bilaterally with a fetal origin of the right PCA noted. Both PCAs are patent with mild asymmetric attenuation of distal right PCA branch vessels but no evidence of significant proximal stenosis. The internal carotid arteries are widely patent from skull base to carotid termini. ACAs and MCAs are patent without evidence of proximal  branch occlusion or significant proximal stenosis. No aneurysm is identified. MRA NECK FINDINGS There is a standard 3 vessel aortic arch. The brachiocephalic and subclavian arteries are widely patent. The carotid arteries are patent without evidence of significant stenosis or dissection. There is less than 50% narrowing of the proximal left ICA, likely due to atherosclerotic plaque. The vertebral arteries are patent and codominant with antegrade flow bilaterally. Signal loss in the proximal V1 segments bilaterally is likely artifactual. No vertebral artery stenosis is evident more distally. IMPRESSION: 1. Unremarkable appearance of the brain.  No infarct. 2. Patent circle of Willis without proximal branch occlusion or significant stenosis. 3. Patent cervical carotid arteries without evidence of significant stenosis. 4. Patent vertebral arteries with suboptimal evaluation of the V1 segments but normal appearance more distally. Electronically Signed   By: Logan Bores M.D.   On: 07/05/2018 17:32   Mr Angiogram Neck W Or Wo Contrast  Result Date: 07/05/2018 CLINICAL DATA:  Diplopia, nausea, and ataxia. EXAM: MR HEAD WITHOUT CONTRAST MR CIRCLE OF WILLIS WITHOUT CONTRAST MRA OF THE NECK WITHOUT AND WITH CONTRAST TECHNIQUE: Multiplanar, multiecho pulse sequences of the brain, circle of willis and surrounding structures were obtained without intravenous contrast. Angiographic images of the neck were obtained using MRA technique without and with intravenous contrast. CONTRAST:  90mL MULTIHANCE GADOBENATE DIMEGLUMINE 529 MG/ML IV SOLN COMPARISON:  Head CT 07/05/2018 FINDINGS: MR HEAD FINDINGS Brain: There is no evidence of acute infarct, intracranial hemorrhage, mass, midline shift, or extra-axial fluid collection. The ventricles and sulci are normal. The brain is normal in signal. Vascular: Major intracranial vascular flow voids are preserved. Skull and upper cervical spine: Unremarkable bone marrow signal.  Sinuses/Orbits: Bilateral cataract extraction. Clear paranasal sinuses. Small right mastoid effusion. Other: None. MR CIRCLE OF WILLIS FINDINGS The study is mildly motion degraded. The visualized distal vertebral arteries are widely patent to the basilar. Patent PICA and SCA origins are visualized bilaterally. The basilar artery is widely patent. There are posterior communicating arteries bilaterally with a fetal origin of the right PCA noted. Both PCAs are patent with mild asymmetric attenuation of distal right PCA branch vessels but no evidence of significant proximal stenosis. The internal carotid arteries are widely patent from skull base to carotid termini. ACAs and MCAs are patent without evidence of proximal branch occlusion or significant proximal stenosis. No aneurysm is identified. MRA NECK FINDINGS There is a standard 3 vessel aortic arch. The brachiocephalic and subclavian arteries are widely patent. The carotid arteries are patent without evidence of significant stenosis or dissection. There is less than 50% narrowing of the proximal left ICA, likely due to atherosclerotic plaque. The vertebral arteries are patent and codominant with antegrade flow bilaterally. Signal loss in the proximal V1 segments bilaterally is likely artifactual. No vertebral artery stenosis is evident more distally. IMPRESSION: 1. Unremarkable appearance of the brain.  No infarct. 2. Patent circle of Willis without proximal  branch occlusion or significant stenosis. 3. Patent cervical carotid arteries without evidence of significant stenosis. 4. Patent vertebral arteries with suboptimal evaluation of the V1 segments but normal appearance more distally. Electronically Signed   By: Logan Bores M.D.   On: 07/05/2018 17:32   Mr Brain Wo Contrast  Result Date: 07/05/2018 CLINICAL DATA:  Diplopia, nausea, and ataxia. EXAM: MR HEAD WITHOUT CONTRAST MR CIRCLE OF WILLIS WITHOUT CONTRAST MRA OF THE NECK WITHOUT AND WITH CONTRAST  TECHNIQUE: Multiplanar, multiecho pulse sequences of the brain, circle of willis and surrounding structures were obtained without intravenous contrast. Angiographic images of the neck were obtained using MRA technique without and with intravenous contrast. CONTRAST:  35mL MULTIHANCE GADOBENATE DIMEGLUMINE 529 MG/ML IV SOLN COMPARISON:  Head CT 07/05/2018 FINDINGS: MR HEAD FINDINGS Brain: There is no evidence of acute infarct, intracranial hemorrhage, mass, midline shift, or extra-axial fluid collection. The ventricles and sulci are normal. The brain is normal in signal. Vascular: Major intracranial vascular flow voids are preserved. Skull and upper cervical spine: Unremarkable bone marrow signal. Sinuses/Orbits: Bilateral cataract extraction. Clear paranasal sinuses. Small right mastoid effusion. Other: None. MR CIRCLE OF WILLIS FINDINGS The study is mildly motion degraded. The visualized distal vertebral arteries are widely patent to the basilar. Patent PICA and SCA origins are visualized bilaterally. The basilar artery is widely patent. There are posterior communicating arteries bilaterally with a fetal origin of the right PCA noted. Both PCAs are patent with mild asymmetric attenuation of distal right PCA branch vessels but no evidence of significant proximal stenosis. The internal carotid arteries are widely patent from skull base to carotid termini. ACAs and MCAs are patent without evidence of proximal branch occlusion or significant proximal stenosis. No aneurysm is identified. MRA NECK FINDINGS There is a standard 3 vessel aortic arch. The brachiocephalic and subclavian arteries are widely patent. The carotid arteries are patent without evidence of significant stenosis or dissection. There is less than 50% narrowing of the proximal left ICA, likely due to atherosclerotic plaque. The vertebral arteries are patent and codominant with antegrade flow bilaterally. Signal loss in the proximal V1 segments  bilaterally is likely artifactual. No vertebral artery stenosis is evident more distally. IMPRESSION: 1. Unremarkable appearance of the brain.  No infarct. 2. Patent circle of Willis without proximal branch occlusion or significant stenosis. 3. Patent cervical carotid arteries without evidence of significant stenosis. 4. Patent vertebral arteries with suboptimal evaluation of the V1 segments but normal appearance more distally. Electronically Signed   By: Logan Bores M.D.   On: 07/05/2018 17:32   Ct Head Code Stroke Wo Contrast  Result Date: 07/05/2018 CLINICAL DATA:  Code stroke. Gait changes. Vision changes and aphasia EXAM: CT HEAD WITHOUT CONTRAST TECHNIQUE: Contiguous axial images were obtained from the base of the skull through the vertex without intravenous contrast. COMPARISON:  None. FINDINGS: Brain: No evidence of acute infarction, hemorrhage, hydrocephalus, extra-axial collection or mass lesion/mass effect. Vascular: No hyperdense vessel.  Atherosclerotic calcification. Skull: Normal. Negative for fracture or focal lesion. Sinuses/Orbits: Bilateral cataract resection Other: These results were communicated to Dr. Leonie Man at 3:11 pmon 8/20/2019by text page via the [facility] messaging system. ASPECTS [facility] Stroke Program Early CT Score) - Ganglionic level infarction (caudate, lentiform nuclei, internal capsule, insula, M1-M3 cortex): 7 - Supraganglionic infarction (M4-M6 cortex): 3 Total score (0-10 with 10 being normal): 10 IMPRESSION: Normal appearance of the brain. Electronically Signed   By: Monte Fantasia M.D.   On: 07/05/2018 15:12   2D echocardiogram - Left  ventricle: The cavity size was normal. Wall thickness was normal. Systolic function was normal. The estimated ejection fraction was in the range of 60% to 65%. Wall motion was normal; there were no regional wall motion abnormalities. Doppler parameters are consistent with abnormal left ventricular relaxation (grade 1 diastolic  dysfunction). - Mitral valve: Calcified annulus. - Pulmonary arteries: Systolic pressure was mildly increased. PA peak pressure: 31 mm Hg (S). Impressions:   No cardiac source of emboli was indentified.  Carotid Doppler   There is 1-39% bilateral ICA stenosis. Vertebral artery flow is antegrade.    PHYSICAL EXAM Pleasant anxious looking elderly caucasian lady not in distress. . Afebrile. Head is nontraumatic. Neck is supple without bruit.    Cardiac exam no murmur or gallop. Lungs are clear to auscultation. Distal pulses are well felt. Neurological Exam ;  Awake  Alert oriented x 3. Normal speech and language.eye movements full without nystagmus.fundi were not visualized. Vision acuity and fields appear normal. Hearing is normal. Palatal movements are normal. Face symmetric. Tongue midline. Normal strength, tone, reflexes and coordination. Normal sensation. Gait deferred.   ASSESSMENT/PLAN Kelly Mooney is a 72 y.o. female with history of asthma, COPD, diabetes, GERD, high cholesterol, hypertension and hypothyroidism presenting with what she describes as diplopia found to have an upgaze.  Symptoms now resolved.  Vertebrobasilar TIA  Code Stroke CT head No acute stroke. ASPECTS 10.     MRI head, MRA head & neck   unremarkable.  No infarct.  Patent circle of Willis.  No significant stenosis.  Carotid Doppler  B ICA 1-39% stenosis, VAs antegrade   2D Echo  EF 60*-65%. No source of embolus   LDL 51.  No indication for statin from stroke standpoint if not on statin prior to admission  HgbA1c 6.4  Lovenox 40 mg sq daily for VTE prophylaxis  No antithrombotic prior to admission, now on aspirin 325 mg daily. Given TIA, will place on aspirin 81 mg and plavix 75 mg daily x 3 weeks, then aspirin alone. Orders adjusted.   Therapy recommendations:  No therapy needs  Disposition:  Return home [place] for d/c from stroke standpoint once 2D resulted Follow-up Stroke Clinic at [facility] in 4 weeks. Office will call with appointment date and time. Order placed.  Hypertension  Stable . BP goal normotensive  Diabetes type II  HgbA1c 6.4, goal < 7.0  Controlled  Other Stroke Risk Factors  Advanced age  Former cigarette smoker  Overweight, Body mass index is 28.26 kg/m., recommend weight loss, diet and exercise as appropriate   Other Active Problems  Asthma  Hypothyroidism  GERD  Hospital day # 0  Burnetta Sabin, MSN, APRN, ANVP-BC, AGPCNP-BC Advanced Practice Stroke Nurse [place] for Schedule & Pager information 07/06/2018 2:32 PM  I have personally examined this patient, reviewed notes, independently viewed imaging studies, participated in medical decision making and plan of care.ROS completed by me personally and pertinent positives fully documented  I have made any additions or clarifications directly to the above note. Agree with note above.  I had a long discussion with the patient and Dr. Evalee Mutton about stroke and TIA evaluation and treatment plan and answered questions. Recommend discharge home on dual antiplatelet therapy for 3 weeks followed by aspirin alone. Aggressive risk factor modification.Greater than 50% time during this 25 minute and visit was spent on counseling and coordination of care about a TIA and stroke risk and answering questions Follow-up in  the stroke clinic in 6 weeks. Stroke team will sign off. Kindly call for questions.  Antony Contras, MD Medical Director [facility] Stroke Center Pager: [phone] 07/06/2018 3:26 PM  To contact Stroke Continuity provider, please refer to [email or web address] After hours, contact General Neurology

## 2018-07-11 ENCOUNTER — Telehealth: Payer: Self-pay | Admitting: Neurology

## 2018-07-11 NOTE — Telephone Encounter (Signed)
Dr. Leonie Man prescribed rosuvastatin (CRESTOR) 20 MG tablet when patient was in the hospital. She has been taking since discharge on 07-07-18 and has had a pounding headache and her hands hurt and are cramping. Is there another medication she can take? Please call and discuss. She uses [facility] on [address] in [place].

## 2018-07-11 NOTE — Telephone Encounter (Signed)
Message sent to Dr. Leonie Man. Pt was discharge from hospital last week.

## 2018-07-12 ENCOUNTER — Other Ambulatory Visit: Payer: Self-pay | Admitting: Neurology

## 2018-07-12 MED ORDER — ATORVASTATIN CALCIUM 20 MG PO TABS: 20.0000 mg | ORAL_TABLET | Freq: Every day | ORAL | 3 refills | Status: DC

## 2018-07-12 NOTE — Telephone Encounter (Signed)
I spoke to the patient who complained of muscle aches and pains on Crestor. I recommend she discontinue it and switch to Lipitor 20 mg daily and also start taking coenzyme Q 10 200 mg daily. I changed her prescription in Epic. She voiced understanding

## 2018-07-25 ENCOUNTER — Telehealth: Payer: Self-pay | Admitting: *Deleted

## 2018-07-25 NOTE — Telephone Encounter (Signed)
Kelly Mooney, She needs neurology clearance before proceeding with colonoscopy. If that means rescheduling, then fine. What ever they want her on (Plavix and/or ASA) for possible TIA, she should stay on for her colonoscopy. Thanks for checking. Dr. Henrene Pastor

## 2018-07-25 NOTE — Telephone Encounter (Signed)
Attempted pt [phone]- no answer- [facility] Tuesday

## 2018-07-25 NOTE — Telephone Encounter (Signed)
Dr Henrene Pastor,  Kelly Mooney is scheduled with you for a colon 08-12-18, Friday. She is a recall colon. In 2000 she had a colon with surg path showing colitis and 1 TA polyp. She was admitted 07-05-2018 for a possible TIA.  This is her discharge note:  Brief Hospital Course: Transient ischemic attack: Presented with transient diplopia-that has since resolved.  MRI brain negative for CVA.  Telemetry without any arrhythmias.  Echocardiogram without any embolic source-preserved EF.  Carotid Doppler did not show any major stenosis.  Recommendations from neurology is to continue with aspirin/Plavix for 3 weeks, followed by aspirin alone.  Also has been started on high intensity statin.  Stable for discharge.  No therapies recommended by physical therapy.  She was to take Plavix and an ASA  x 3 weeks and then just do ASA daily.  She has a neuro follow up 08-04-18.  She has a hx of HTN and DM.    Do you want her to have an OV or can she proceed with her colon as scheduled?  Please advise.  Thanks for your time, Lelan Pons

## 2018-07-26 NOTE — Telephone Encounter (Signed)
Spoke with pt- she wishes to cancel colon and PV- ( both cancelled ) and see neuro [phone] and discuss with them- she is still off balance and having headaches as well - she is still on her Plavix and ASA and states she may have to continue the Plavix after she sees Neuro on the 19 th- she will call back to RS colon  once all this has been cleared up

## 2018-08-04 ENCOUNTER — Ambulatory Visit (INDEPENDENT_AMBULATORY_CARE_PROVIDER_SITE_OTHER): Payer: Medicare Other | Admitting: Adult Health

## 2018-08-04 ENCOUNTER — Encounter: Payer: Self-pay | Admitting: Adult Health

## 2018-08-04 VITALS — BP 141/69 | HR 75 | Ht 61.0 in | Wt 156.0 lb

## 2018-08-04 DIAGNOSIS — G459 Transient cerebral ischemic attack, unspecified: Secondary | ICD-10-CM | POA: Diagnosis not present

## 2018-08-04 DIAGNOSIS — I10 Essential (primary) hypertension: Secondary | ICD-10-CM | POA: Diagnosis not present

## 2018-08-04 DIAGNOSIS — E785 Hyperlipidemia, unspecified: Secondary | ICD-10-CM | POA: Diagnosis not present

## 2018-08-04 DIAGNOSIS — Z794 Long term (current) use of insulin: Secondary | ICD-10-CM

## 2018-08-04 DIAGNOSIS — E119 Type 2 diabetes mellitus without complications: Secondary | ICD-10-CM | POA: Diagnosis not present

## 2018-08-04 NOTE — Progress Notes (Signed)
[facility] [address] [place] [address] [phone]       OFFICE FOLLOW UP NOTE  [name] Date of Birth:  1946/06/07 Medical Record Number:  [id number]   Reason for Referral:  hospital TIA follow up  CHIEF COMPLAINT:  Chief Complaint  Patient presents with  . Follow-up    Hospital Stroke follow up room 9 pt alone    HPI: Kelly Mooney is being seen today for initial visit in the office for TIA on 07/05/2018. History obtained from patient and chart review. Reviewed all radiology images and labs personally.  Ms. Kelly Mooney is a 72 y.o. female with history of asthma, COPD, diabetes, GERD, high cholesterol, hypertension and hypothyroidism  who presented with what she describes as diplopia, nausea and gait ataxia.  Patient schedule appointment with ophthalmology and was found to have a mild upgaze restriction and she was sent to the emergency room for further work-up.   Per notes, she denied headache, vertigo, numbness, focal weakness or speech difficulties.  Denied history of stroke or TIA or any other neurological problems.  CT head reviewed was negative for acute infarct.  MRI head reviewed and was negative for acute infarct.  MRA head and neck unremarkable without significant stenosis.  Doppler showed bilateral ICA stenosis of 1 to 39% with VAs antegrade.  2D echo showed an EF of 60 to 65% without cardiac source of embolus.  LDL 51 and recommended aspirin.  A1c 6.4 .  Pand recommended continued follow-up with PCP for management of DM.  HTN stable during admission recommended long-term BP goal normotensive range. Patient was not on antithrombotic PTA and recommended DAPT for 3 weeks and then aspirin alone.  As symptoms resolved prior to discharge, patient was discharged home in stable condition without needed therapies.  Patient is being seen today for hospital follow-up.  She did call office on 8/26//2019 with complaints of muscle aches and  pains since starting Crestor.  It was recommended to stop Crestor and switch to Lipitor 20 mg daily along with co-every 10 200 mg daily per Dr. Leonie Man.   Patient is being seen today for hospital follow-up and overall states she is been doing well without residual deficits or reoccurring of symptoms.  She has completed 3 weeks of DAPT therapy and currently is on aspirin 81 mg without side effects of bleeding or bruising.  She continues to take Lipitor 20 mg at this time without side effects of myalgias.  Patient was previously on fenofibrate which was prescribed by her PCP.  Patient states she stopped this as she was started on Lipitor.  She is questioning whether this should be restarted at this time.  She does monitor glucose levels at home which have been stable and continues to be managed by her PCP.  Blood pressure today 141/69 and patient states she does monitor blood pressure at home intermittently.  She has returned to work at [employer] and is able to do this without complication.  Denies new or worsening stroke/TIA symptoms.    ROS:   14 system review of systems performed and negative with exception of hearing loss, shortness of breath, cough, wheezing, allergies, and runny nose  PMH:  Past Medical History:  Diagnosis Date  . Asthma    since age 59  . Colon polyps   . COPD (chronic obstructive pulmonary disease) ([place])   . Diabetes mellitus   . Diverticulosis   . Esophageal stricture   .  GERD (gastroesophageal reflux disease)   . Hiatal hernia   . Hypercholesteremia   . Hypertension   . Paraesophageal hernia   . Stroke ([place])   . Thyroid disease     PSH:  Past Surgical History:  Procedure Laterality Date  . BUNIONECTOMY  10/2012   right foot  . CHOLECYSTECTOMY  1980  . ESOPHAGEAL MANOMETRY N/A 07/15/2015   Procedure: ESOPHAGEAL MANOMETRY (EM);  Surgeon: Irene Shipper, MD;  Location: [facility];  Service: Endoscopy;  Laterality: N/A;  . partail hysterectomy    .  RHINOPLASTY     nasal polyps removed dr. Barrie Folk    Social History:  Social History   Socioeconomic History  . Marital status: Widowed    Spouse name: Not on file  . Number of children: 2  . Years of education: Not on file  . Highest education level: Not on file  Occupational History  . Occupation: Retired  Scientific laboratory technician  . Financial resource strain: Not on file  . Food insecurity:    Worry: Not on file    Inability: Not on file  . Transportation needs:    Medical: Not on file    Non-medical: Not on file  Tobacco Use  . Smoking status: Former Research scientist (life sciences)  . Smokeless tobacco: Never Used  . Tobacco comment: quit 1997  Substance and Sexual Activity  . Alcohol use: No    Alcohol/week: 0.0 standard drinks  . Drug use: No  . Sexual activity: Not on file  Lifestyle  . Physical activity:    Days per week: Not on file    Minutes per session: Not on file  . Stress: Not on file  Relationships  . Social connections:    Talks on phone: Not on file    Gets together: Not on file    Attends religious service: Not on file    Active member of club or organization: Not on file    Attends meetings of clubs or organizations: Not on file    Relationship status: Not on file  . Intimate partner violence:    Fear of current or ex partner: Not on file    Emotionally abused: Not on file    Physically abused: Not on file    Forced sexual activity: Not on file  Other Topics Concern  . Not on file  Social History Narrative  . Not on file    Family History:  Family History  Problem Relation Age of Onset  . Diabetes Mellitus I Mother   . Hyperlipidemia Mother   . Asthma Mother   . Heart attack Father   . Heart attack Brother   . Asthma Brother   . Colon cancer Neg Hx   . Colon polyps Neg Hx     Medications:   Current Outpatient Medications on File Prior to Visit  Medication Sig Dispense Refill  . albuterol (PROVENTIL HFA;VENTOLIN HFA) 108 (90 BASE) MCG/ACT inhaler Inhale 2 puffs  into the lungs 4 (four) times daily.     Marland Kitchen amLODipine (NORVASC) 10 MG tablet Take 10 mg by mouth daily.    Marland Kitchen aspirin 81 MG EC tablet Take 1 tablet (81 mg total) by mouth daily. Aspirin and Plavix for 3 weeks, followed by aspirin alone. 30 tablet 0  . atorvastatin (LIPITOR) 20 MG tablet Take 1 tablet (20 mg total) by mouth daily. 30 tablet 3  . clobetasol cream (TEMOVATE) 9.24 % Apply 1 application topically See admin instructions. Apply a thin layer to affected  areas 2 times a day as needed for irritation  4  . clopidogrel (PLAVIX) 75 MG tablet Take 1 tablet (75 mg total) by mouth daily. Aspirin and Plavix for 3 weeks, followed by Aspirin alone. 21 tablet 0  . Coenzyme Q10 (COQ10 PO) Take 200 mg by mouth daily.    . fluocinonide (LIDEX) 0.05 % external solution Apply 1 application topically See admin instructions. Apply to affected areas daily as needed for itching    . glimepiride (AMARYL) 4 MG tablet Take 4 mg by mouth 2 (two) times daily.    Marland Kitchen HYDROcodone-acetaminophen (NORCO/VICODIN) 5-325 MG tablet Take 1-2 tablets by mouth daily as needed (for pain).     Marland Kitchen levothyroxine (SYNTHROID, LEVOTHROID) 137 MCG tablet Take 137 mcg by mouth daily before breakfast.    . metFORMIN (GLUCOPHAGE-XR) 500 MG 24 hr tablet Take 1,000 mg by mouth 2 (two) times daily.  4  . montelukast (SINGULAIR) 10 MG tablet Take 10 mg by mouth daily.   4  . pantoprazole (PROTONIX) 40 MG tablet TAKE 1 TABLET (40 MG TOTAL) BY MOUTH DAILY. 30 tablet 3  . quinapril (ACCUPRIL) 40 MG tablet Take 40 mg by mouth daily.      No current facility-administered medications on file prior to visit.     Allergies:   Allergies  Allergen Reactions  . Chloramphenicol Anaphylaxis  . Ivp Dye [Iodinated Diagnostic Agents] Anaphylaxis, Shortness Of Breath and Other (See Comments)    SEVERE convulsions, too   . Crestor [Rosuvastatin Calcium]     Muscle cramps  . Iodine Itching  . Sulfa Antibiotics Rash     Physical Exam  Vitals:    08/04/18 0829  BP: (!) 141/69  Pulse: 75  Weight: 156 lb (70.8 kg)  Height: 5\' 1"  (1.549 m)   Body mass index is 29.48 kg/m. No exam data present  General: well developed, well nourished, pleasant elderly Caucasian female, seated, in no evident distress Head: head normocephalic and atraumatic.   Neck: supple with no carotid or supraclavicular bruits Cardiovascular: regular rate and rhythm, no murmurs Musculoskeletal: no deformity Skin:  no rash/petichiae Vascular:  Normal pulses all extremities  Neurologic Exam Mental Status: Awake and fully alert. Oriented to place and time. Recent and remote memory intact. Attention span, concentration and fund of knowledge appropriate. Mood and affect appropriate.  Cranial Nerves: Fundoscopic exam reveals sharp disc margins. Pupils equal, briskly reactive to light. Extraocular movements full without nystagmus. Visual fields full to confrontation. Hearing intact. Facial sensation intact. Face, tongue, palate moves normally and symmetrically.  Motor: Normal bulk and tone. Normal strength in all tested extremity muscles. Sensory.: intact to touch , pinprick , position and vibratory sensation.  Coordination: Rapid alternating movements normal in all extremities. Finger-to-nose and heel-to-shin performed accurately bilaterally. Gait and Station: Arises from chair without difficulty. Stance is normal. Gait demonstrates normal stride length and balance . Able to heel, toe and tandem walk without difficulty.  Reflexes: 1+ and symmetric. Toes downgoing.    NIHSS  0 Modified Rankin  0    Diagnostic Data (Labs, Imaging, Testing)  CT HEAD WO CONTRAST 07/05/2018 IMPRESSION: Normal appearance of the brain.  MR BRAIN WO CONTRAST MR MRA HEAD  MR MRA NECK 07/05/2018 IMPRESSION: 1. Unremarkable appearance of the brain.  No infarct. 2. Patent circle of Willis without proximal branch occlusion or significant stenosis. 3. Patent cervical carotid arteries  without evidence of significant stenosis. 4. Patent vertebral arteries with suboptimal evaluation of the V1 segments but normal  appearance more distally.  ECHOCARDIOGRAM 07/06/2018 Study Conclusions - Left ventricle: The cavity size was normal. Wall thickness was   normal. Systolic function was normal. The estimated ejection   fraction was in the range of 60% to 65%. Wall motion was normal;   there were no regional wall motion abnormalities. Doppler   parameters are consistent with abnormal left ventricular   relaxation (grade 1 diastolic dysfunction). - Mitral valve: Calcified annulus. - Pulmonary arteries: Systolic pressure was mildly increased. PA   peak pressure: 31 mm Hg (S).   ASSESSMENT: RINDI BEECHY is a 72 y.o. year old female here with TIA on the 2019. Vascular risk factors include asthma, COPD, DM, HTN and HLD.  Patient is being seen today for hospital follow-up and overall is doing well without recurrent symptoms.    PLAN: -Continue aspirin 81 mg daily  and Lipitor 20 mg for secondary stroke prevention -Advised to restart fenofibrate as prescribed by PCP and request lipid panel at future PCP follow-up -F/u with PCP regarding your HLD, HTN and DM management -continue to monitor BP at home more frequently to ensure satisfactory BP -advised to continue to stay active and maintain a healthy diet -Maintain strict control of hypertension with blood pressure goal below 130/90, diabetes with hemoglobin A1c goal below 6.5% and cholesterol with LDL cholesterol (bad cholesterol) goal below 70 mg/dL. I also advised the patient to eat a healthy diet with plenty of whole grains, cereals, fruits and vegetables, exercise regularly and maintain ideal body weight.  Follow up in 6 months or call earlier if needed   Greater than 50% of time during this 25 minute visit was spent on counseling,explanation of diagnosis of TIA, reviewing risk factor management of HLD, DM and HTN, planning of  further management, discussion with patient and family and coordination of care    Venancio Poisson, AGNP-BC  [facility] [address] [place], [place] [address]  Phone [phone] Fax [phone] Note: This document was prepared with digital dictation and possible smart phrase technology. Any transcriptional errors that result from this process are unintentional.

## 2018-08-04 NOTE — Progress Notes (Signed)
I agree with the above plan 

## 2018-08-04 NOTE — Patient Instructions (Signed)
Continue aspirin 81 mg daily  and lipitor 20mg   for secondary stroke prevention  Continue to follow up with PCP regarding cholesterol, diabetes and blood pressure management   Continue to stay active and maintain a healthy diet  Continue to monitor blood pressure at home  Maintain strict control of hypertension with blood pressure goal below 130/90, diabetes with hemoglobin A1c goal below 6.5% and cholesterol with LDL cholesterol (bad cholesterol) goal below 70 mg/dL. I also advised the patient to eat a healthy diet with plenty of whole grains, cereals, fruits and vegetables, exercise regularly and maintain ideal body weight.  Followup in the future with me in 6 months or call earlier if needed       Thank you for coming to see [place] at [facility]. I hope we have been able to provide you high quality care today.  You may receive a patient satisfaction survey over the next few weeks. We would appreciate your feedback and comments so that we may continue to improve ourselves and the health of our patients.

## 2018-08-12 ENCOUNTER — Encounter: Payer: Medicare Other | Admitting: Internal Medicine

## 2018-08-19 ENCOUNTER — Telehealth: Payer: Self-pay | Admitting: Adult Health

## 2018-08-19 MED ORDER — ATORVASTATIN CALCIUM 20 MG PO TABS: 20.0000 mg | ORAL_TABLET | Freq: Every day | ORAL | 3 refills | Status: DC

## 2018-08-19 NOTE — Telephone Encounter (Signed)
Refill sent to pharmacy as a 90 day supply.

## 2018-08-19 NOTE — Telephone Encounter (Signed)
Pt is asking for a refill on her atorvastatin (LIPITOR) 20 MG tablet.  Pt is asking if this can be done as a 90 day instead of a 30 day.  Please call

## 2018-08-19 NOTE — Addendum Note (Signed)
Addended by: Belinda Block A on: 08/19/2018 10:31 AM   Modules accepted: Orders

## 2018-09-26 ENCOUNTER — Emergency Department (HOSPITAL_COMMUNITY): Payer: Medicare Other

## 2018-09-26 ENCOUNTER — Emergency Department (HOSPITAL_COMMUNITY)
Admission: EM | Admit: 2018-09-26 | Discharge: 2018-09-26 | Disposition: A | Discharge status: Home / Self Care | Payer: Medicare Other | Attending: Emergency Medicine | Admitting: Emergency Medicine

## 2018-09-26 ENCOUNTER — Telehealth: Payer: Self-pay | Admitting: Adult Health

## 2018-09-26 ENCOUNTER — Encounter (HOSPITAL_COMMUNITY): Payer: Self-pay | Admitting: Emergency Medicine

## 2018-09-26 DIAGNOSIS — R51 Headache: Secondary | ICD-10-CM | POA: Diagnosis present

## 2018-09-26 DIAGNOSIS — I10 Essential (primary) hypertension: Secondary | ICD-10-CM | POA: Diagnosis not present

## 2018-09-26 DIAGNOSIS — J449 Chronic obstructive pulmonary disease, unspecified: Secondary | ICD-10-CM | POA: Diagnosis not present

## 2018-09-26 DIAGNOSIS — H4922 Sixth [abducent] nerve palsy, left eye: Secondary | ICD-10-CM | POA: Diagnosis not present

## 2018-09-26 DIAGNOSIS — Z7984 Long term (current) use of oral hypoglycemic drugs: Secondary | ICD-10-CM | POA: Insufficient documentation

## 2018-09-26 DIAGNOSIS — Z87891 Personal history of nicotine dependence: Secondary | ICD-10-CM | POA: Insufficient documentation

## 2018-09-26 DIAGNOSIS — E039 Hypothyroidism, unspecified: Secondary | ICD-10-CM | POA: Insufficient documentation

## 2018-09-26 DIAGNOSIS — E119 Type 2 diabetes mellitus without complications: Secondary | ICD-10-CM | POA: Insufficient documentation

## 2018-09-26 DIAGNOSIS — Z7982 Long term (current) use of aspirin: Secondary | ICD-10-CM | POA: Insufficient documentation

## 2018-09-26 DIAGNOSIS — Z79899 Other long term (current) drug therapy: Secondary | ICD-10-CM | POA: Insufficient documentation

## 2018-09-26 LAB — APTT: aPTT: 33 seconds (ref 24–36)

## 2018-09-26 LAB — COMPREHENSIVE METABOLIC PANEL
ALBUMIN: 3.8 g/dL (ref 3.5–5.0)
ALT: 32 U/L (ref 0–44)
ANION GAP: 6 (ref 5–15)
AST: 25 U/L (ref 15–41)
Alkaline Phosphatase: 52 U/L (ref 38–126)
BUN: 11 mg/dL (ref 8–23)
CHLORIDE: 109 mmol/L (ref 98–111)
CO2: 26 mmol/L (ref 22–32)
Calcium: 9.4 mg/dL (ref 8.9–10.3)
Creatinine, Ser: 0.77 mg/dL (ref 0.44–1.00)
GFR calc Af Amer: >60 mL/min (ref >60)
GFR calc non Af Amer: >60 mL/min (ref >60)
GLUCOSE: 169 mg/dL — AB (ref 70–99)
POTASSIUM: 4 mmol/L (ref 3.5–5.1)
SODIUM: 141 mmol/L (ref 135–145)
Total Bilirubin: 0.5 mg/dL (ref 0.3–1.2)
Total Protein: 6.3 g/dL — ABNORMAL LOW (ref 6.5–8.1)

## 2018-09-26 LAB — PROTIME-INR
INR: 1.02
PROTHROMBIN TIME: 13.3 s (ref 11.4–15.2)

## 2018-09-26 LAB — CBC
HEMATOCRIT: 42.4 % (ref 36.0–46.0)
Hemoglobin: 13.4 g/dL (ref 12.0–15.0)
MCH: 27.5 pg (ref 26.0–34.0)
MCHC: 31.6 g/dL (ref 30.0–36.0)
MCV: 87.1 fL (ref 80.0–100.0)
NRBC: 0 % (ref 0.0–0.2)
Platelets: 189 10*3/uL (ref 150–400)
RBC: 4.87 MIL/uL (ref 3.87–5.11)
RDW: 12.6 % (ref 11.5–15.5)
WBC: 6.7 10*3/uL (ref 4.0–10.5)

## 2018-09-26 LAB — I-STAT CHEM 8, ED
BUN: 14 mg/dL (ref 8–23)
CHLORIDE: 107 mmol/L (ref 98–111)
CREATININE: 0.7 mg/dL (ref 0.44–1.00)
Calcium, Ion: 1.22 mmol/L (ref 1.15–1.40)
Glucose, Bld: 164 mg/dL — ABNORMAL HIGH (ref 70–99)
HCT: 39 % (ref 36.0–46.0)
Hemoglobin: 13.3 g/dL (ref 12.0–15.0)
Potassium: 3.8 mmol/L (ref 3.5–5.1)
SODIUM: 143 mmol/L (ref 135–145)
TCO2: 28 mmol/L (ref 22–32)

## 2018-09-26 LAB — HM DIABETES EYE EXAM

## 2018-09-26 LAB — DIFFERENTIAL
ABS IMMATURE GRANULOCYTES: 0.02 10*3/uL (ref 0.00–0.07)
BASOS ABS: 0 10*3/uL (ref 0.0–0.1)
Basophils Relative: 1 %
EOS ABS: 0.2 10*3/uL (ref 0.0–0.5)
Eosinophils Relative: 3 %
IMMATURE GRANULOCYTES: 0 %
Lymphocytes Relative: 35 %
Lymphs Abs: 2.3 10*3/uL (ref 0.7–4.0)
Monocytes Absolute: 0.5 10*3/uL (ref 0.1–1.0)
Monocytes Relative: 7 %
Neutro Abs: 3.6 10*3/uL (ref 1.7–7.7)
Neutrophils Relative %: 54 %

## 2018-09-26 LAB — I-STAT TROPONIN, ED: Troponin i, poc: 0 ng/mL (ref 0.00–0.08)

## 2018-09-26 LAB — ETHANOL: Alcohol, Ethyl (B): <10 mg/dL (ref <10)

## 2018-09-26 MED ORDER — LORAZEPAM 1 MG PO TABS
1.0000 mg | ORAL_TABLET | Freq: Once | ORAL | Status: AC
  Administered 2018-09-26: 1 mg via ORAL
  Filled 2018-09-26: qty 1

## 2018-09-26 NOTE — ED Notes (Signed)
Pt ambulated to bathroom without any problems 

## 2018-09-26 NOTE — ED Notes (Signed)
Got patient undress on the monitor did ekg shown to Dr Regenia Skeeter patient is resting with call bell in reach and family at bedside

## 2018-09-26 NOTE — ED Notes (Signed)
Pt to MRI at this time.

## 2018-09-26 NOTE — ED Notes (Signed)
Pt returned from MRI °

## 2018-09-26 NOTE — Telephone Encounter (Signed)
RN receive call from [facility]. He stated pt has 6 nerve palsy skewed and a deviation.He is sending pt to the ED. RN sending message to Kalman Jewels he will fax over the office notes later.

## 2018-09-26 NOTE — Telephone Encounter (Signed)
Received office notes from [facility].  Per notes, patient presented with double vision with which has been present throughout the weekend along with a headache.  She also complains of left eye pain when she moves her eye all the way to the left.  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

## 2018-09-26 NOTE — ED Provider Notes (Signed)
[facility] EMERGENCY DEPARTMENT Provider Note   CSN: [id number] Arrival date & time: 09/26/18  1448     History   Chief Complaint Chief Complaint  Patient presents with  . Headache    HPI Kelly Mooney is a 72 y.o. female.  Patient evaluated for TIA with admission back on August 20.  Extensive work-up at that time though without any evidence of any great vessel abnormalities to the brain.  At that time patient had a mild headache and had sudden onset of double vision.  But it resolved.  Same thing happened this morning about 6 this morning but is persisted.  Patient went to her ophthalmologist who noted that there is seem to be a left 6th nerve palsy patient sent in to rule out CVA.  Discussed with the neuro hospitalist recommended MRI brain.  To rule out CVA.  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  Patient did have a headache associated with it.  But no history of migraines.  Headache here today sort of left side on top of the head and around the forehead area.  And she has double vision which occurred while she was driving this morning making it difficult to drive.  Patient denies any speech problems any facial numbness any facial weakness any weakness or numbness to her arms or lower extremities.  Coordination does seem to be off a little bit with her fingers but that is probably secondary to the double vision.     Past Medical History:  Diagnosis Date  . Asthma    since age 51  . Colon polyps   . COPD (chronic obstructive pulmonary disease) ([place])   . Diabetes mellitus   . Diverticulosis   . Esophageal stricture   . GERD (gastroesophageal reflux disease)   . Hiatal hernia   . Hypercholesteremia   . Hypertension   . Paraesophageal hernia   . Stroke ([place])   . Thyroid disease     Patient Active Problem List   Diagnosis Date Noted  . TIA (transient ischemic attack) 07/05/2018  . Type 2 diabetes mellitus without  complication, with long-term current use of insulin ([place]) 07/05/2018  . Hypothyroidism 07/05/2018  . Essential hypertension 07/05/2018    Past Surgical History:  Procedure Laterality Date  . BUNIONECTOMY  10/2012   right foot  . CHOLECYSTECTOMY  1980  . ESOPHAGEAL MANOMETRY N/A 07/15/2015   Procedure: ESOPHAGEAL MANOMETRY (EM);  Surgeon: Irene Shipper, MD;  Location: [facility];  Service: Endoscopy;  Laterality: N/A;  . partail hysterectomy    . RHINOPLASTY     nasal polyps removed dr. Barrie Folk     OB History   None      Home Medications    Prior to Admission medications   Medication Sig Start Date End Date Taking? Authorizing Provider  albuterol (PROVENTIL HFA;VENTOLIN HFA) 108 (90 BASE) MCG/ACT inhaler Inhale 2 puffs into the lungs 4 (four) times daily.    Yes [provider]  amLODipine (NORVASC) 10 MG tablet Take 10 mg by mouth daily.   Yes [provider]  aspirin 81 MG EC tablet Take 1 tablet (81 mg total) by mouth daily. Aspirin and Plavix for 3 weeks, followed by aspirin alone. 07/07/18  Yes Ghimire, Henreitta Leber, MD  atorvastatin (LIPITOR) 20 MG tablet Take 1 tablet (20 mg total) by mouth daily. 08/19/18  Yes Venancio Poisson, NP  clobetasol cream (TEMOVATE) 4.40 % Apply 1 application topically  See admin instructions. Apply a thin layer to affected areas 2 times a day as needed for irritation 02/17/16  Yes [provider]  escitalopram (LEXAPRO) 5 MG tablet Take 5 mg by mouth daily. 09/06/18  Yes [provider]  fluocinonide (LIDEX) 0.05 % external solution Apply 1 application topically See admin instructions. Apply to affected areas daily as needed for itching   Yes [provider]  glimepiride (AMARYL) 4 MG tablet Take 4 mg by mouth 2 (two) times daily.   Yes [provider]  levothyroxine (SYNTHROID, LEVOTHROID) 137 MCG tablet Take 137 mcg by mouth daily before breakfast.   Yes [provider]  metFORMIN  (GLUCOPHAGE-XR) 500 MG 24 hr tablet Take 1,000 mg by mouth 2 (two) times daily. 04/04/18  Yes [provider]  montelukast (SINGULAIR) 10 MG tablet Take 10 mg by mouth daily.  03/21/15  Yes [provider]  pantoprazole (PROTONIX) 40 MG tablet TAKE 1 TABLET (40 MG TOTAL) BY MOUTH DAILY. 01/26/17  Yes Irene Shipper, MD  quinapril (ACCUPRIL) 40 MG tablet Take 40 mg by mouth daily.    Yes [provider]    Family History Family History  Problem Relation Age of Onset  . Diabetes Mellitus I Mother   . Hyperlipidemia Mother   . Asthma Mother   . Heart attack Father   . Heart attack Brother   . Asthma Brother   . Colon cancer Neg Hx   . Colon polyps Neg Hx     Social History Social History   Tobacco Use  . Smoking status: Former Research scientist (life sciences)  . Smokeless tobacco: Never Used  . Tobacco comment: quit 1997  Substance Use Topics  . Alcohol use: No    Alcohol/week: 0.0 standard drinks  . Drug use: No     Allergies   Chloramphenicol; Ivp dye [iodinated diagnostic agents]; Crestor [rosuvastatin calcium]; Iodine; and Sulfa antibiotics   Review of Systems Review of Systems  Constitutional: Negative for fever.  HENT: Negative for congestion and trouble swallowing.   Eyes: Positive for visual disturbance. Negative for pain.  Respiratory: Negative for shortness of breath.   Cardiovascular: Negative for chest pain.  Gastrointestinal: Negative for abdominal pain.  Genitourinary: Negative for dysuria.  Musculoskeletal: Negative for back pain and neck pain.  Neurological: Positive for headaches. Negative for dizziness, seizures, syncope, facial asymmetry, speech difficulty, weakness and numbness.  Hematological: Does not bruise/bleed easily.  Psychiatric/Behavioral: Negative for confusion.     Physical Exam Updated Vital Signs BP (!) 197/76   Pulse 69   Temp 98.4 F (36.9 C) (Oral)   Resp 18   SpO2 93%   Physical Exam  Constitutional: She is oriented to  person, place, and time. She appears well-developed and well-nourished. No distress.  HENT:  Head: Normocephalic and atraumatic.  Mouth/Throat: Oropharynx is clear and moist.  Eyes: Pupils are equal, round, and reactive to light. Conjunctivae are normal.  Slight upper gaze abnormality.  On the left side  Neck: Normal range of motion. Neck supple.  Cardiovascular: Normal rate, regular rhythm and normal heart sounds.  Pulmonary/Chest: Effort normal and breath sounds normal. No respiratory distress.  Abdominal: Soft. Bowel sounds are normal. There is no tenderness.  Musculoskeletal: Normal range of motion.  Neurological: She is alert and oriented to person, place, and time. A cranial nerve deficit is present. No sensory deficit. She exhibits normal muscle tone. Coordination normal.  Other than the left upward gaze abnormality.  No other focal deficits.  Coordination off a little bit but if she covers her left eye it does fine.  No upper extremity weakness no lower semi-weakness no speech problems.  No sensory deficit.  Skin: Skin is warm.  Nursing note and vitals reviewed.    ED Treatments / Results  Labs (all labs ordered are listed, but only abnormal results are displayed) Labs Reviewed  COMPREHENSIVE METABOLIC PANEL - Abnormal; Notable for the following components:      Result Value   Glucose, Bld 169 (*)    Total Protein 6.3 (*)    All other components within normal limits  I-STAT CHEM 8, ED - Abnormal; Notable for the following components:   Glucose, Bld 164 (*)    All other components within normal limits  ETHANOL  PROTIME-INR  APTT  CBC  DIFFERENTIAL  RAPID URINE DRUG SCREEN, HOSP PERFORMED  URINALYSIS, ROUTINE W REFLEX MICROSCOPIC  I-STAT TROPONIN, ED    EKG EKG Interpretation  Date/Time:  Monday September 26 2018 14:58:44 EST Ventricular Rate:  86 PR Interval:    QRS Duration: 91 QT Interval:  355 QTC Calculation: 425 R Axis:   48 Text Interpretation:  Sinus  rhythm Low voltage, precordial leads RSR' in V1 or V2, probably normal variant nonspecific ST/T changes similar to Aug 2019 Confirmed by Sherwood Gambler [phone]) on 09/26/2018 3:05:08 PM   Radiology Mr Brain Wo Contrast (neuro Protocol)  Result Date: 09/26/2018 CLINICAL DATA:  Headache for 3-4 days.  Double vision. EXAM: MRI HEAD WITHOUT CONTRAST TECHNIQUE: Multiplanar, multiecho pulse sequences of the brain and surrounding structures were obtained without intravenous contrast. COMPARISON:  07/05/2018 FINDINGS: BRAIN: There is no acute infarct, acute hemorrhage, hydrocephalus or extra-axial collection. The midline structures are normal. No midline shift or other mass effect. There are no old infarcts. The white matter signal is normal for the patient's age. The cerebral and cerebellar volume are age-appropriate. Susceptibility-sensitive sequences show no chronic microhemorrhage or superficial siderosis. VASCULAR: Major intracranial arterial and venous sinus flow voids are normal. SKULL AND UPPER CERVICAL SPINE: Calvarial bone marrow signal is normal. There is no skull base mass. Visualized upper cervical spine and soft tissues are normal. SINUSES/ORBITS: Small amount of right mastoid fluid. The orbits are normal. IMPRESSION: Normal MRI of the brain. Electronically Signed   By: Ulyses Jarred M.D.   On: 09/26/2018 19:50    Procedures Procedures (including critical care time)  Medications Ordered in ED Medications  LORazepam (ATIVAN) tablet 1 mg (1 mg Oral Given 09/26/18 1850)     Initial Impression / Assessment and Plan / ED Course  I have reviewed the triage vital signs and the nursing notes.  Pertinent labs & imaging results that were available during my care of the patient were reviewed by me and considered in my medical decision making (see chart for details).    Discussed with the neuro hospitalist.  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   There is a headache component.  So is possible there could be a complicated migraine.  She had similar symptoms occur a month ago.  Extensive work-up then to include MRA brain head and neck.  Without any significant findings.  They recommended just MR brain today it was negative.  Patient still has the double vision.  No specific treatment.  She is followed by Dr. city from neurology.  She will follow back up with them.  Blood pressure here today was a little high we held off on treating  that in case she did have a stroke.  Patient will take her blood pressure medicines when she gets home.  She will follow back up with her primary care doctor regarding the diabetes.  Patient told she can wear a patch on the left eye that will help correct the double vision in the meantime.  Patient given a work note to be out of work.   Final Clinical Impressions(s) / ED Diagnoses   Final diagnoses:  Sixth nerve palsy of left eye    ED Discharge Orders    None       Fredia Sorrow, MD 09/26/18 2114

## 2018-09-26 NOTE — Discharge Instructions (Addendum)
MRI today without any evidence of a stroke.  Your left 6th nerve palsy probably secondary to microvascular problems due to diabetes.  Blood pressure had become elevated here recommend to take your blood pressure medicine when you get home.  You could correct the double vision by wearing an eye patch on the left eye.  Work note provided to be out of work.  Call your neurologist in the morning for follow-up.  Return for any new or worse symptoms.

## 2018-09-26 NOTE — Telephone Encounter (Signed)
RN call patient about her vision issues. Pt has appt today with the eye MD at 1230. Rn recommend Dr. Richardson Dopp send [place] the eye report. Rn gave fax number of [phone].RN stated message will be sent to [facility] NP.

## 2018-09-26 NOTE — ED Triage Notes (Signed)
Pt to ER for evaluation of headache x3-4 days. States at 6:30 this morning, while driving to work she had double vision, since then that has subsided but states vision is "foggy." pt in NAD. Pt reports headache at this time 5/10.

## 2018-09-26 NOTE — Telephone Encounter (Signed)
Pt requesting a call, stating she has an appt with her eye doctor later today(12:30), but he feels the pt should f/u with NP or MD due to increased blurred/doubled vision. Would like to discuss in greater detail with NP

## 2018-09-28 ENCOUNTER — Ambulatory Visit (INDEPENDENT_AMBULATORY_CARE_PROVIDER_SITE_OTHER): Payer: Medicare Other | Admitting: Adult Health

## 2018-09-28 ENCOUNTER — Encounter: Payer: Self-pay | Admitting: Adult Health

## 2018-09-28 VITALS — BP 132/67 | HR 76 | Ht 61.0 in | Wt 155.6 lb

## 2018-09-28 DIAGNOSIS — E119 Type 2 diabetes mellitus without complications: Secondary | ICD-10-CM

## 2018-09-28 DIAGNOSIS — I639 Cerebral infarction, unspecified: Secondary | ICD-10-CM

## 2018-09-28 DIAGNOSIS — E785 Hyperlipidemia, unspecified: Secondary | ICD-10-CM

## 2018-09-28 DIAGNOSIS — G43611 Persistent migraine aura with cerebral infarction, intractable, with status migrainosus: Secondary | ICD-10-CM | POA: Diagnosis not present

## 2018-09-28 DIAGNOSIS — I10 Essential (primary) hypertension: Secondary | ICD-10-CM

## 2018-09-28 DIAGNOSIS — G459 Transient cerebral ischemic attack, unspecified: Secondary | ICD-10-CM

## 2018-09-28 DIAGNOSIS — Z794 Long term (current) use of insulin: Secondary | ICD-10-CM

## 2018-09-28 MED ORDER — TOPIRAMATE 50 MG PO TABS: 50.0000 mg | ORAL_TABLET | Freq: Every day | ORAL | 6 refills | Status: DC

## 2018-09-28 NOTE — Patient Instructions (Signed)
Continue aspirin 81 mg daily  and lipitor  for secondary stroke prevention  Continue to follow up with PCP regarding cholesterol, blood pressure and diabetes management   Start topamax 25mg  nightly as these episodes may be migraine related to double vision symptoms - continue 25mg  for the first week. After the first week, increase to 50mg  nightly  Continue to stay active and maintain a healthy  Continue to monitor blood pressure at home  Maintain strict control of hypertension with blood pressure goal below 130/90, diabetes with hemoglobin A1c goal below 6.5% and cholesterol with LDL cholesterol (bad cholesterol) goal below 70 mg/dL. I also advised the patient to eat a healthy diet with plenty of whole grains, cereals, fruits and vegetables, exercise regularly and maintain ideal body weight.  Followup in the future with me in 6 months or call earlier if needed       Thank you for coming to see [place] at [facility]. I hope we have been able to provide you high quality care today.  You may receive a patient satisfaction survey over the next few weeks. We would appreciate your feedback and comments so that we may continue to improve ourselves and the health of our patients.

## 2018-09-28 NOTE — Progress Notes (Signed)
Kelly Mooney [address] [place]. [place] [address] [phone]       OFFICE FOLLOW UP NOTE  Kelly Mooney Date of Birth:  Apr 15, 1946 Medical Record Number:  [id number]   Reason for Referral:  hospital TIA follow up  CHIEF COMPLAINT:  Chief Complaint  Patient presents with  . Follow-up    Follow up for from ED visit on 09/26/2018 room 9 pt with Kelly Mooney her daughter    HPI: Kelly Mooney is being seen today for initial visit in the office for TIA on 07/05/2018. History obtained from patient and chart review. Reviewed all radiology images and labs personally.  Ms. Kelly Mooney is a 72 y.o. female with history of asthma, COPD, diabetes, GERD, high cholesterol, hypertension and hypothyroidism  who presented with what she describes as diplopia, nausea and gait ataxia.  Patient schedule appointment with ophthalmology and was found to have a mild upgaze restriction and she was sent to the emergency room for further work-up.   Per notes, she denied headache, vertigo, numbness, focal weakness or speech difficulties.  Denied history of stroke or TIA or any other neurological problems.  CT head reviewed was negative for acute infarct.  MRI head reviewed and was negative for acute infarct.  MRA head and neck unremarkable without significant stenosis.  Doppler showed bilateral ICA stenosis of 1 to 39% with VAs antegrade.  2D echo showed an EF of 60 to 65% without cardiac source of embolus.  LDL 51 and recommended aspirin.  A1c 6.4 .  Pand recommended continued follow-up with PCP for management of DM.  HTN stable during admission recommended long-term BP goal normotensive range. Patient was not on antithrombotic PTA and recommended DAPT for 3 weeks and then aspirin alone.  As symptoms resolved prior to discharge, patient was discharged home in stable condition without needed therapies.  Patient is being seen today for hospital follow-up.  She did call office on 8/26//2019 with  complaints of muscle aches and pains since starting Crestor.  It was recommended to stop Crestor and switch to Lipitor 20 mg daily along with co-every 10 200 mg daily per Dr. Leonie Man.   08/04/18 visit: Patient is being seen today for hospital follow-up and overall states she is been doing well without residual deficits or reoccurring of symptoms.  She has completed 3 weeks of DAPT therapy and currently is on aspirin 81 mg without side effects of bleeding or bruising.  She continues to take Lipitor 20 mg at this time without side effects of myalgias.  Patient was previously on fenofibrate which was prescribed by her PCP.  Patient states she stopped this as she was started on Lipitor.  She is questioning whether this should be restarted at this time.  She does monitor glucose levels at home which have been stable and continues to be managed by her PCP.  Blood pressure today 141/69 and patient states she does monitor blood pressure at home intermittently.  She has returned to work at [employer] and is able to do this without complication.  Denies new or worsening stroke/TIA symptoms.  Interval history 09/28/2018: Patient is being seen today due to recent hospitalization on 09/26/2018 after she was evaluated at ophthalmology due to concerns of persistent double vision.  Ophthalmologist noted possible left 6th nerve palsy and patient was sent to ED for further work-up.  MRI brain reviewed and was negative for acute abnormality.  MRA head and neck negative for emergent LVO.  Patient does  endorse having a headache which started on 09/23/2018 that would wax and wane in intensity.  She states while driving to work on Monday, she started to experience double vision.  She states this is the same type of symptoms that occurred when she was initially diagnosed with a TIA in 06/2018.  She does state that her double vision started subsiding when she was not driving but does continue to experience slight blurred vision along  with continuation of headache.  She denies any other episodes of visual difficulties.  Her headache is located bilateral temporal areas and associated with photophobia and phonophobia along with some nausea.  She has attempted to use Tylenol but only relieved some of the headache.  Denies any history of migraines or family history of migraines.  She denies this episode occurring since 06/2018 or prior or any other visual difficulties during the interval time.  Denies trigeminal or occipital nerve pain.  Denies any other neurological symptoms.  She does continue to take aspirin 81 mg daily and atorvastatin 20 mg daily for secondary stroke prevention and denies any associated side effects.  Blood pressure stable and at today's appointment 132/67.  She does state that glucose levels have been fluctuating.  No further concerns at this time.    ROS:   14 system review of systems performed and negative with exception of blurred vision, double vision and left eye pain   PMH:  Past Medical History:  Diagnosis Date  . Asthma    since age 51  . Colon polyps   . COPD (chronic obstructive pulmonary disease) ([place])   . Diabetes mellitus   . Diverticulosis   . Esophageal stricture   . GERD (gastroesophageal reflux disease)   . Hiatal hernia   . Hypercholesteremia   . Hypertension   . Paraesophageal hernia   . Stroke ([place])   . Thyroid disease     PSH:  Past Surgical History:  Procedure Laterality Date  . BUNIONECTOMY  10/2012   right foot  . CHOLECYSTECTOMY  1980  . ESOPHAGEAL MANOMETRY N/A 07/15/2015   Procedure: ESOPHAGEAL MANOMETRY (EM);  Surgeon: Irene Shipper, MD;  Location: [facility];  Service: Endoscopy;  Laterality: N/A;  . partail hysterectomy    . RHINOPLASTY     nasal polyps removed dr. Barrie Folk    Social History:  Social History   Socioeconomic History  . Marital status: Widowed    Spouse name: Not on file  . Number of children: 2  . Years of education: Not on file  . Highest  education level: Not on file  Occupational History  . Occupation: Retired  Scientific laboratory technician  . Financial resource strain: Not on file  . Food insecurity:    Worry: Not on file    Inability: Not on file  . Transportation needs:    Medical: Not on file    Non-medical: Not on file  Tobacco Use  . Smoking status: Former Research scientist (life sciences)  . Smokeless tobacco: Never Used  . Tobacco comment: quit 1997  Substance and Sexual Activity  . Alcohol use: No    Alcohol/week: 0.0 standard drinks  . Drug use: No  . Sexual activity: Not on file  Lifestyle  . Physical activity:    Days per week: Not on file    Minutes per session: Not on file  . Stress: Not on file  Relationships  . Social connections:    Talks on phone: Not on file    Gets together: Not on file  Attends religious service: Not on file    Active member of club or organization: Not on file    Attends meetings of clubs or organizations: Not on file    Relationship status: Not on file  . Intimate partner violence:    Fear of current or ex partner: Not on file    Emotionally abused: Not on file    Physically abused: Not on file    Forced sexual activity: Not on file  Other Topics Concern  . Not on file  Social History Narrative  . Not on file    Family History:  Family History  Problem Relation Age of Onset  . Diabetes Mellitus I Mother   . Hyperlipidemia Mother   . Asthma Mother   . Heart attack Father   . Heart attack Brother   . Asthma Brother   . Colon cancer Neg Hx   . Colon polyps Neg Hx     Medications:   Current Outpatient Medications on File Prior to Visit  Medication Sig Dispense Refill  . albuterol (PROVENTIL HFA;VENTOLIN HFA) 108 (90 BASE) MCG/ACT inhaler Inhale 2 puffs into the lungs 4 (four) times daily.     Marland Kitchen amLODipine (NORVASC) 10 MG tablet Take 10 mg by mouth daily.    Marland Kitchen aspirin 81 MG EC tablet Take 1 tablet (81 mg total) by mouth daily. Aspirin and Plavix for 3 weeks, followed by aspirin alone. 30 tablet  0  . atorvastatin (LIPITOR) 20 MG tablet Take 1 tablet (20 mg total) by mouth daily. 90 tablet 3  . clobetasol cream (TEMOVATE) 3.57 % Apply 1 application topically See admin instructions. Apply a thin layer to affected areas 2 times a day as needed for irritation  4  . escitalopram (LEXAPRO) 5 MG tablet Take 5 mg by mouth daily.    . fluocinonide (LIDEX) 0.05 % external solution Apply 1 application topically See admin instructions. Apply to affected areas daily as needed for itching    . glimepiride (AMARYL) 4 MG tablet Take 4 mg by mouth 2 (two) times daily.    Marland Kitchen levothyroxine (SYNTHROID, LEVOTHROID) 137 MCG tablet Take 137 mcg by mouth daily before breakfast.    . metFORMIN (GLUCOPHAGE-XR) 500 MG 24 hr tablet Take 1,000 mg by mouth 2 (two) times daily.  4  . montelukast (SINGULAIR) 10 MG tablet Take 10 mg by mouth daily.   4  . pantoprazole (PROTONIX) 40 MG tablet TAKE 1 TABLET (40 MG TOTAL) BY MOUTH DAILY. 30 tablet 3  . quinapril (ACCUPRIL) 40 MG tablet Take 40 mg by mouth daily.      No current facility-administered medications on file prior to visit.     Allergies:   Allergies  Allergen Reactions  . Chloramphenicol Anaphylaxis  . Ivp Dye [Iodinated Diagnostic Agents] Anaphylaxis, Shortness Of Breath and Other (See Comments)    SEVERE convulsions, too   . Crestor [Rosuvastatin Calcium]     Muscle cramps  . Iodine Itching  . Sulfa Antibiotics Rash     Physical Exam  Vitals:   09/28/18 1406  BP: 132/67  Pulse: 76  Weight: 155 lb 9.6 oz (70.6 kg)  Height: 5\' 1"  (1.549 m)   Body mass index is 29.4 kg/m. No exam data present  General: well developed, well nourished, pleasant elderly Caucasian female, seated, in no evident distress Head: head normocephalic and atraumatic.   Neck: supple with no carotid or supraclavicular bruits Cardiovascular: regular rate and rhythm, no murmurs Musculoskeletal: no deformity Skin:  no rash/petichiae Vascular:  Normal pulses all  extremities  Neurologic Exam Mental Status: Awake and fully alert. Oriented to place and time. Recent and remote memory intact. Attention span, concentration and fund of knowledge appropriate. Mood and affect appropriate.  Cranial Nerves: Fundoscopic exam reveals sharp disc margins. Pupils equal, briskly reactive to light. Extraocular movements full without nystagmus. Visual fields full to confrontation. Hearing intact. Facial sensation intact. Face, tongue, palate moves normally and symmetrically.  Motor: Normal bulk and tone. Normal strength in all tested extremity muscles. Sensory.: intact to touch , pinprick , position and vibratory sensation.  Coordination: Rapid alternating movements normal in all extremities. Finger-to-nose and heel-to-shin performed accurately bilaterally. Gait and Station: Arises from chair without difficulty. Stance is normal. Gait demonstrates normal stride length and balance . Able to heel, toe and tandem walk without difficulty.  Reflexes: 1+ and symmetric. Toes downgoing.       Diagnostic Data (Labs, Imaging, Testing)  CT HEAD WO CONTRAST 07/05/2018 IMPRESSION: Normal appearance of the brain.  MR BRAIN WO CONTRAST MR MRA HEAD  MR MRA NECK 07/05/2018 IMPRESSION: 1. Unremarkable appearance of the brain.  No infarct. 2. Patent circle of Willis without proximal branch occlusion or significant stenosis. 3. Patent cervical carotid arteries without evidence of significant stenosis. 4. Patent vertebral arteries with suboptimal evaluation of the V1 segments but normal appearance more distally.  ECHOCARDIOGRAM 07/06/2018 Study Conclusions - Left ventricle: The cavity size was normal. Wall thickness was   normal. Systolic function was normal. The estimated ejection   fraction was in the range of 60% to 65%. Wall motion was normal;   there were no regional wall motion abnormalities. Doppler   parameters are consistent with abnormal left ventricular    relaxation (grade 1 diastolic dysfunction). - Mitral valve: Calcified annulus. - Pulmonary arteries: Systolic pressure was mildly increased. PA   peak pressure: 31 mm Hg (S).   ASSESSMENT: Kelly Mooney is a 72 y.o. year old female here with TIA on the 2019. Vascular risk factors include asthma, COPD, DM, HTN and HLD.  Patient is being seen today after recent hospital admission for double vision and was initially felt to be possible 6 cranial nerve palsy by her ophthalmologist.  During ED admission along with today's exam, symptoms most likely related to compicated migraine.    PLAN: -Continue aspirin 81 mg daily  and Lipitor 20 mg for secondary stroke prevention -F/u with PCP regarding your HLD, HTN and DM management -complicated migraine: We will start Topamax 25 mg nightly for 1 week and then increase to 50 mg nightly -Education provided to patient regarding migraines along with Topamax education -Advised patient to proceed to ED if headache worsens or experiencing worsening double/blurred vision -Continue to follow with ophthalmologist -continue to monitor BP  -advised to continue to stay active and maintain a healthy diet -Maintain strict control of hypertension with blood pressure goal below 130/90, diabetes with hemoglobin A1c goal below 6.5% and cholesterol with LDL cholesterol (bad cholesterol) goal below 70 mg/dL. I also advised the patient to eat a healthy diet with plenty of whole grains, cereals, fruits and vegetables, exercise regularly and maintain ideal body weight.  Schedule follow-up appointment in 6 months time but advised patient to call with additional episodes of migraine or double vision.  Also advised to call with questions, concerns or need a sooner follow-up appointment   Greater than 50% of time during this 25 minute visit was spent on counseling,explanation of diagnosis of TIA, reviewing risk  factor management of HLD, DM and HTN, planning of further management,  discussion with patient and family and coordination of care    Venancio Poisson, [facility]  [facility] [address] [place], [place] [address]  Phone [phone] Fax [phone] Note: This document was prepared with digital dictation and possible smart phrase technology. Any transcriptional errors that result from this process are unintentional.

## 2018-10-01 NOTE — Progress Notes (Signed)
I agree with the above plan 

## 2019-02-02 ENCOUNTER — Ambulatory Visit: Payer: Medicare Other | Admitting: Adult Health

## 2019-03-29 ENCOUNTER — Ambulatory Visit: Payer: Medicare Other | Admitting: Adult Health

## 2019-09-17 DIAGNOSIS — U071 COVID-19: Secondary | ICD-10-CM

## 2019-09-17 HISTORY — DX: COVID-19: U07.1

## 2019-10-02 ENCOUNTER — Other Ambulatory Visit: Payer: Self-pay

## 2019-10-02 ENCOUNTER — Emergency Department (HOSPITAL_BASED_OUTPATIENT_CLINIC_OR_DEPARTMENT_OTHER): Payer: Medicare Other

## 2019-10-02 ENCOUNTER — Inpatient Hospital Stay (HOSPITAL_BASED_OUTPATIENT_CLINIC_OR_DEPARTMENT_OTHER)
Admission: EM | Admit: 2019-10-02 | Discharge: 2019-10-06 | DRG: 177 | Disposition: A | Discharge status: Home / Self Care | Payer: Medicare Other | Attending: Internal Medicine | Admitting: Internal Medicine

## 2019-10-02 ENCOUNTER — Encounter (HOSPITAL_BASED_OUTPATIENT_CLINIC_OR_DEPARTMENT_OTHER): Payer: Self-pay | Admitting: *Deleted

## 2019-10-02 DIAGNOSIS — E119 Type 2 diabetes mellitus without complications: Secondary | ICD-10-CM | POA: Diagnosis not present

## 2019-10-02 DIAGNOSIS — K219 Gastro-esophageal reflux disease without esophagitis: Secondary | ICD-10-CM | POA: Diagnosis present

## 2019-10-02 DIAGNOSIS — J9601 Acute respiratory failure with hypoxia: Secondary | ICD-10-CM | POA: Diagnosis present

## 2019-10-02 DIAGNOSIS — U071 COVID-19: Secondary | ICD-10-CM | POA: Diagnosis present

## 2019-10-02 DIAGNOSIS — E7800 Pure hypercholesterolemia, unspecified: Secondary | ICD-10-CM | POA: Diagnosis not present

## 2019-10-02 DIAGNOSIS — J441 Chronic obstructive pulmonary disease with (acute) exacerbation: Secondary | ICD-10-CM | POA: Diagnosis not present

## 2019-10-02 DIAGNOSIS — J069 Acute upper respiratory infection, unspecified: Secondary | ICD-10-CM | POA: Diagnosis present

## 2019-10-02 DIAGNOSIS — R300 Dysuria: Secondary | ICD-10-CM | POA: Diagnosis not present

## 2019-10-02 DIAGNOSIS — Z794 Long term (current) use of insulin: Secondary | ICD-10-CM

## 2019-10-02 DIAGNOSIS — J449 Chronic obstructive pulmonary disease, unspecified: Secondary | ICD-10-CM | POA: Diagnosis present

## 2019-10-02 DIAGNOSIS — Z882 Allergy status to sulfonamides status: Secondary | ICD-10-CM

## 2019-10-02 DIAGNOSIS — Z79890 Hormone replacement therapy (postmenopausal): Secondary | ICD-10-CM | POA: Diagnosis not present

## 2019-10-02 DIAGNOSIS — E039 Hypothyroidism, unspecified: Secondary | ICD-10-CM | POA: Diagnosis not present

## 2019-10-02 DIAGNOSIS — Z888 Allergy status to other drugs, medicaments and biological substances status: Secondary | ICD-10-CM

## 2019-10-02 DIAGNOSIS — Z20822 Contact with and (suspected) exposure to covid-19: Secondary | ICD-10-CM

## 2019-10-02 DIAGNOSIS — Z7982 Long term (current) use of aspirin: Secondary | ICD-10-CM

## 2019-10-02 DIAGNOSIS — Z8673 Personal history of transient ischemic attack (TIA), and cerebral infarction without residual deficits: Secondary | ICD-10-CM | POA: Diagnosis not present

## 2019-10-02 DIAGNOSIS — Z7984 Long term (current) use of oral hypoglycemic drugs: Secondary | ICD-10-CM | POA: Diagnosis not present

## 2019-10-02 DIAGNOSIS — Z833 Family history of diabetes mellitus: Secondary | ICD-10-CM

## 2019-10-02 DIAGNOSIS — I10 Essential (primary) hypertension: Secondary | ICD-10-CM | POA: Diagnosis present

## 2019-10-02 DIAGNOSIS — N179 Acute kidney failure, unspecified: Secondary | ICD-10-CM | POA: Diagnosis present

## 2019-10-02 DIAGNOSIS — Z79899 Other long term (current) drug therapy: Secondary | ICD-10-CM

## 2019-10-02 DIAGNOSIS — Z825 Family history of asthma and other chronic lower respiratory diseases: Secondary | ICD-10-CM | POA: Diagnosis not present

## 2019-10-02 DIAGNOSIS — R269 Unspecified abnormalities of gait and mobility: Secondary | ICD-10-CM | POA: Diagnosis present

## 2019-10-02 DIAGNOSIS — J1289 Other viral pneumonia: Secondary | ICD-10-CM | POA: Diagnosis not present

## 2019-10-02 DIAGNOSIS — Z8249 Family history of ischemic heart disease and other diseases of the circulatory system: Secondary | ICD-10-CM | POA: Diagnosis not present

## 2019-10-02 DIAGNOSIS — J440 Chronic obstructive pulmonary disease with acute lower respiratory infection: Secondary | ICD-10-CM | POA: Diagnosis not present

## 2019-10-02 DIAGNOSIS — Z87891 Personal history of nicotine dependence: Secondary | ICD-10-CM

## 2019-10-02 DIAGNOSIS — F329 Major depressive disorder, single episode, unspecified: Secondary | ICD-10-CM | POA: Diagnosis not present

## 2019-10-02 DIAGNOSIS — E785 Hyperlipidemia, unspecified: Secondary | ICD-10-CM | POA: Diagnosis not present

## 2019-10-02 DIAGNOSIS — Z8349 Family history of other endocrine, nutritional and metabolic diseases: Secondary | ICD-10-CM | POA: Diagnosis not present

## 2019-10-02 DIAGNOSIS — Z91041 Radiographic dye allergy status: Secondary | ICD-10-CM

## 2019-10-02 LAB — CBC WITH DIFFERENTIAL/PLATELET
Abs Immature Granulocytes: 0.02 10*3/uL (ref 0.00–0.07)
Basophils Absolute: 0 10*3/uL (ref 0.0–0.1)
Basophils Relative: 0 %
Eosinophils Absolute: 0 10*3/uL (ref 0.0–0.5)
Eosinophils Relative: 0 %
HCT: 39.9 % (ref 36.0–46.0)
Hemoglobin: 13.2 g/dL (ref 12.0–15.0)
Immature Granulocytes: 1 %
Lymphocytes Relative: 17 %
Lymphs Abs: 0.7 10*3/uL (ref 0.7–4.0)
MCH: 28.9 pg (ref 26.0–34.0)
MCHC: 33.1 g/dL (ref 30.0–36.0)
MCV: 87.5 fL (ref 80.0–100.0)
Monocytes Absolute: 0.3 10*3/uL (ref 0.1–1.0)
Monocytes Relative: 6 %
Neutro Abs: 3.1 10*3/uL (ref 1.7–7.7)
Neutrophils Relative %: 76 %
Platelets: 184 10*3/uL (ref 150–400)
RBC: 4.56 MIL/uL (ref 3.87–5.11)
RDW: 13.3 % (ref 11.5–15.5)
WBC: 4 10*3/uL (ref 4.0–10.5)
nRBC: 0 % (ref 0.0–0.2)

## 2019-10-02 LAB — PROCALCITONIN: Procalcitonin: <0.1 ng/mL

## 2019-10-02 LAB — COMPREHENSIVE METABOLIC PANEL
ALT: 23 U/L (ref 0–44)
AST: 38 U/L (ref 15–41)
Albumin: 3.6 g/dL (ref 3.5–5.0)
Alkaline Phosphatase: 38 U/L (ref 38–126)
Anion gap: 13 (ref 5–15)
BUN: 19 mg/dL (ref 8–23)
CO2: 22 mmol/L (ref 22–32)
Calcium: 8.5 mg/dL — ABNORMAL LOW (ref 8.9–10.3)
Chloride: 104 mmol/L (ref 98–111)
Creatinine, Ser: 1.36 mg/dL — ABNORMAL HIGH (ref 0.44–1.00)
GFR calc Af Amer: 44 mL/min — ABNORMAL LOW (ref >60)
GFR calc non Af Amer: 38 mL/min — ABNORMAL LOW (ref >60)
Glucose, Bld: 120 mg/dL — ABNORMAL HIGH (ref 70–99)
Potassium: 3.2 mmol/L — ABNORMAL LOW (ref 3.5–5.1)
Sodium: 139 mmol/L (ref 135–145)
Total Bilirubin: 1 mg/dL (ref 0.3–1.2)
Total Protein: 7.1 g/dL (ref 6.5–8.1)

## 2019-10-02 LAB — URINALYSIS, MICROSCOPIC (REFLEX)

## 2019-10-02 LAB — URINALYSIS, ROUTINE W REFLEX MICROSCOPIC
Glucose, UA: NEGATIVE mg/dL
Hgb urine dipstick: NEGATIVE
Ketones, ur: 15 mg/dL — AB
Leukocytes,Ua: NEGATIVE
Nitrite: NEGATIVE
Protein, ur: 30 mg/dL — AB
Specific Gravity, Urine: >1.03 — ABNORMAL HIGH (ref 1.005–1.030)
pH: 5.5 (ref 5.0–8.0)

## 2019-10-02 LAB — TROPONIN I (HIGH SENSITIVITY)
Troponin I (High Sensitivity): 5 ng/L (ref <18)
Troponin I (High Sensitivity): 4 ng/L (ref <18)

## 2019-10-02 LAB — FIBRINOGEN: Fibrinogen: 541 mg/dL — ABNORMAL HIGH (ref 210–475)

## 2019-10-02 LAB — LACTIC ACID, PLASMA
Lactic Acid, Venous: 1.8 mmol/L (ref 0.5–1.9)
Lactic Acid, Venous: 1.7 mmol/L (ref 0.5–1.9)

## 2019-10-02 LAB — D-DIMER, QUANTITATIVE: D-Dimer, Quant: 0.57 ug/mL-FEU — ABNORMAL HIGH (ref 0.00–0.50)

## 2019-10-02 LAB — TRIGLYCERIDES: Triglycerides: 168 mg/dL — ABNORMAL HIGH (ref <150)

## 2019-10-02 LAB — LACTATE DEHYDROGENASE: LDH: 175 U/L (ref 98–192)

## 2019-10-02 LAB — C-REACTIVE PROTEIN: CRP: 3 mg/dL — ABNORMAL HIGH (ref <1.0)

## 2019-10-02 LAB — SARS CORONAVIRUS 2 (TAT 6-24 HRS): SARS Coronavirus 2: POSITIVE — AB

## 2019-10-02 LAB — FERRITIN: Ferritin: 144 ng/mL (ref 11–307)

## 2019-10-02 MED ORDER — ALBUTEROL SULFATE HFA 108 (90 BASE) MCG/ACT IN AERS
6.0000 | INHALATION_SPRAY | Freq: Once | RESPIRATORY_TRACT | Status: AC
  Administered 2019-10-02: 6 via RESPIRATORY_TRACT
  Filled 2019-10-02: qty 6.7

## 2019-10-02 MED ORDER — ALBUTEROL SULFATE HFA 108 (90 BASE) MCG/ACT IN AERS
2.0000 | INHALATION_SPRAY | Freq: Four times a day (QID) | RESPIRATORY_TRACT | Status: DC
  Administered 2019-10-03 – 2019-10-06 (×12): 2 via RESPIRATORY_TRACT
  Filled 2019-10-02: qty 6.7

## 2019-10-02 MED ORDER — SODIUM CHLORIDE 0.9 % IV BOLUS
1000.0000 mL | Freq: Once | INTRAVENOUS | Status: AC
  Administered 2019-10-02: 1000 mL via INTRAVENOUS

## 2019-10-02 MED ORDER — ALBUTEROL SULFATE HFA 108 (90 BASE) MCG/ACT IN AERS
2.0000 | INHALATION_SPRAY | RESPIRATORY_TRACT | Status: DC | PRN
  Administered 2019-10-04: 2 via RESPIRATORY_TRACT
  Filled 2019-10-02: qty 6.7

## 2019-10-02 MED ORDER — DEXAMETHASONE SODIUM PHOSPHATE 10 MG/ML IJ SOLN
6.0000 mg | Freq: Once | INTRAMUSCULAR | Status: AC
  Administered 2019-10-02: 6 mg via INTRAVENOUS
  Filled 2019-10-02: qty 1

## 2019-10-02 MED ORDER — ONDANSETRON HCL 4 MG/2ML IJ SOLN
4.0000 mg | Freq: Once | INTRAMUSCULAR | Status: AC
  Administered 2019-10-02: 4 mg via INTRAVENOUS
  Filled 2019-10-02: qty 2

## 2019-10-02 NOTE — ED Provider Notes (Addendum)
  Provider Note MRN:  [id number]  Arrival date & time: 10/02/19    ED Course and Medical Decision Making  Assumed care from Dr. Melina Copa at shift change.  2 weeks of myriad of symptoms, favoring viral illness, possibly COVID-19, awaiting laboratory assessment, oxygenation in the low 90s at rest, will likely need ambulation to determine appropriateness of inpatient versus outpatient management.  5:45 PM update: Work-up largely unremarkable, mild AKI but no evidence of focal pneumonia.  Ambulation test went poorly, patient very symptomatic, very dyspneic, accessory muscle use, saturation down to 90%.  Lives alone, nervous about going home.  Will request admission to hospital service.  Covid test still pending.  6:30 PM update: Discussed with hospitalist who accepts for admission, but explains that coronavirus test will need to result to determine where patient should be transported.  Procedures  Final Clinical Impressions(s) / ED Diagnoses     ICD-10-CM   1. COPD exacerbation ([place])  J44.1   2. Suspected COVID-19 virus infection  Z20.828     ED Discharge Orders    None      Discharge Instructions   None     Barth Kirks. [place] Small, [place] [email or web address]    Maudie Flakes, MD 10/02/19 1749    Maudie Flakes, MD 10/02/19 2225

## 2019-10-02 NOTE — ED Notes (Signed)
Road Test RR max 32, HR max 110, SpO2 90% while ambulating.  Patient purse lipped breathing, accessory muscle use, +DOE.

## 2019-10-02 NOTE — ED Provider Notes (Signed)
[name] EMERGENCY DEPARTMENT Provider Note   CSN: [id number] Arrival date & time: 10/02/19  1326     History   Chief Complaint Chief Complaint  Patient presents with  . Cough    HPI Kelly Mooney is a 73 y.o. female.  She is complaining of 2 weeks of cough nausea chest achiness back pain diarrhea and burning when she urinates.  Headaches and body aches.  Generalized fatigue.  Fever.  She had a televisit with her doctor and has been on doxycycline for a week without any improvement in her symptoms.  She had a Covid test done 3 days ago but no results yet.  No known exposures to Covid.     The history is provided by the patient.  Cough Cough characteristics:  Non-productive Severity:  Moderate Onset quality:  Gradual Duration:  2 weeks Timing:  Intermittent Progression:  Unchanged Chronicity:  New Smoker: no   Context: with activity   Context: not sick contacts   Relieved by:  Nothing Worsened by:  Activity Ineffective treatments: antibiotic. Associated symptoms: chest pain, fever, headaches, myalgias and shortness of breath   Associated symptoms: no rash and no sore throat     Past Medical History:  Diagnosis Date  . Asthma    since age 45  . Colon polyps   . COPD (chronic obstructive pulmonary disease) ([place])   . Diabetes mellitus   . Diverticulosis   . Esophageal stricture   . GERD (gastroesophageal reflux disease)   . Hiatal hernia   . Hypercholesteremia   . Hypertension   . Paraesophageal hernia   . Stroke ([place])   . Thyroid disease     Patient Active Problem List   Diagnosis Date Noted  . TIA (transient ischemic attack) 07/05/2018  . Type 2 diabetes mellitus without complication, with long-term current use of insulin ([place]) 07/05/2018  . Hypothyroidism 07/05/2018  . Essential hypertension 07/05/2018    Past Surgical History:  Procedure Laterality Date  . BUNIONECTOMY  10/2012   right foot  . CHOLECYSTECTOMY  1980  . ESOPHAGEAL  MANOMETRY N/A 07/15/2015   Procedure: ESOPHAGEAL MANOMETRY (EM);  Surgeon: Irene Shipper, MD;  Location: [facility];  Service: Endoscopy;  Laterality: N/A;  . partail hysterectomy    . RHINOPLASTY     nasal polyps removed dr. Barrie Folk     OB History   No obstetric history on file.      Home Medications    Prior to Admission medications   Medication Sig Start Date End Date Taking? Authorizing Provider  albuterol (PROVENTIL HFA;VENTOLIN HFA) 108 (90 BASE) MCG/ACT inhaler Inhale 2 puffs into the lungs 4 (four) times daily.    Yes [provider]  amLODipine (NORVASC) 10 MG tablet Take 10 mg by mouth daily.   Yes [provider]  aspirin 81 MG EC tablet Take 1 tablet (81 mg total) by mouth daily. Aspirin and Plavix for 3 weeks, followed by aspirin alone. 07/07/18  Yes Ghimire, Henreitta Leber, MD  atorvastatin (LIPITOR) 20 MG tablet Take 1 tablet (20 mg total) by mouth daily. 08/19/18  Yes McCue, Janett Billow, NP  clobetasol cream (TEMOVATE) [id number] % Apply 1 application topically See admin instructions. Apply a thin layer to affected areas 2 times a day as needed for irritation 02/17/16  Yes [provider]  fluocinonide (LIDEX) 0.05 % external solution Apply 1 application topically See admin instructions. Apply to affected areas daily as needed for itching   Yes [provider]  glimepiride (AMARYL) 4 MG tablet Take 4 mg by mouth 2 (two) times daily.   Yes [provider]  levothyroxine (SYNTHROID, LEVOTHROID) 137 MCG tablet Take 137 mcg by mouth daily before breakfast.   Yes [provider]  metFORMIN (GLUCOPHAGE-XR) 500 MG 24 hr tablet Take 1,000 mg by mouth 2 (two) times daily. 04/04/18  Yes [provider]  montelukast (SINGULAIR) 10 MG tablet Take 10 mg by mouth daily.  03/21/15  Yes [provider]  pantoprazole (PROTONIX) 40 MG tablet TAKE 1 TABLET (40 MG TOTAL) BY MOUTH DAILY. 01/26/17  Yes Irene Shipper, MD  quinapril (ACCUPRIL) 40 MG  tablet Take 40 mg by mouth daily.    Yes [provider]  escitalopram (LEXAPRO) 5 MG tablet Take 5 mg by mouth daily. 09/06/18   [provider]  topiramate (TOPAMAX) 50 MG tablet Take 1 tablet (50 mg total) by mouth daily. 09/28/18   Frann Rider, NP    Family History Family History  Problem Relation Age of Onset  . Diabetes Mellitus I Mother   . Hyperlipidemia Mother   . Asthma Mother   . Heart attack Father   . Heart attack Brother   . Asthma Brother   . Colon cancer Neg Hx   . Colon polyps Neg Hx     Social History Social History   Tobacco Use  . Smoking status: Former Research scientist (life sciences)  . Smokeless tobacco: Never Used  . Tobacco comment: quit 1997  Substance Use Topics  . Alcohol use: No    Alcohol/week: 0.0 standard drinks  . Drug use: No     Allergies   Chloramphenicol, Ivp dye [iodinated diagnostic agents], Crestor [rosuvastatin calcium], Iodine, and Sulfa antibiotics   Review of Systems Review of Systems  Constitutional: Positive for fatigue and fever.  HENT: Negative for sore throat.   Eyes: Negative for visual disturbance.  Respiratory: Positive for cough and shortness of breath.   Cardiovascular: Positive for chest pain.  Gastrointestinal: Positive for diarrhea and nausea. Negative for abdominal pain and vomiting.  Genitourinary: Positive for dysuria.  Musculoskeletal: Positive for back pain and myalgias.  Skin: Negative for rash.  Neurological: Positive for headaches.     Physical Exam Updated Vital Signs BP 122/61   Pulse (!) 101   Temp 98.6 F (37 C) (Oral)   Resp (!) 22   Ht 5\' 2"  (1.575 m)   Wt 69.9 kg   SpO2 92%   BMI 28.17 kg/m   Physical Exam Vitals signs and nursing note reviewed.  Constitutional:      General: She is not in acute distress.    Appearance: She is well-developed.  HENT:     Head: Normocephalic and atraumatic.  Eyes:     Conjunctiva/sclera: Conjunctivae normal.  Neck:     Musculoskeletal: Neck  supple.  Cardiovascular:     Rate and Rhythm: Regular rhythm. Tachycardia present.     Heart sounds: No murmur.  Pulmonary:     Effort: Pulmonary effort is normal. No respiratory distress.     Breath sounds: Normal breath sounds.  Abdominal:     Palpations: Abdomen is soft.     Tenderness: There is no abdominal tenderness.  Musculoskeletal: Normal range of motion.     Right lower leg: No edema.     Left lower leg: No edema.  Skin:    General: Skin is warm and dry.     Capillary Refill: Capillary refill takes less than 2 seconds.  Neurological:  General: No focal deficit present.     Mental Status: She is alert and oriented to person, place, and time.      ED Treatments / Results  Labs (all labs ordered are listed, but only abnormal results are displayed) Labs Reviewed  COMPREHENSIVE METABOLIC PANEL - Abnormal; Notable for the following components:      Result Value   Potassium 3.2 (*)    Glucose, Bld 120 (*)    Creatinine, Ser 1.36 (*)    Calcium 8.5 (*)    GFR calc non Af Amer 38 (*)    GFR calc Af Amer 44 (*)    All other components within normal limits  D-DIMER, QUANTITATIVE (NOT AT [facility]) - Abnormal; Notable for the following components:   D-Dimer, Quant 0.57 (*)    All other components within normal limits  TRIGLYCERIDES - Abnormal; Notable for the following components:   Triglycerides 168 (*)    All other components within normal limits  FIBRINOGEN - Abnormal; Notable for the following components:   Fibrinogen 541 (*)    All other components within normal limits  C-REACTIVE PROTEIN - Abnormal; Notable for the following components:   CRP 3.0 (*)    All other components within normal limits  URINALYSIS, ROUTINE W REFLEX MICROSCOPIC - Abnormal; Notable for the following components:   Specific Gravity, Urine >1.030 (*)    Bilirubin Urine SMALL (*)    Ketones, ur 15 (*)    Protein, ur 30 (*)    All other components within normal limits  URINALYSIS, MICROSCOPIC  (REFLEX) - Abnormal; Notable for the following components:   Bacteria, UA FEW (*)    All other components within normal limits  CULTURE, BLOOD (ROUTINE X 2)  CULTURE, BLOOD (ROUTINE X 2)  URINE CULTURE  SARS CORONAVIRUS 2 (TAT 6-24 HRS)  LACTIC ACID, PLASMA  LACTIC ACID, PLASMA  CBC WITH DIFFERENTIAL/PLATELET  PROCALCITONIN  LACTATE DEHYDROGENASE  FERRITIN  TROPONIN I (HIGH SENSITIVITY)  TROPONIN I (HIGH SENSITIVITY)    EKG EKG Interpretation  Date/Time:  Monday October 02 2019 14:58:15 EST Ventricular Rate:  94 PR Interval:    QRS Duration: 93 QT Interval:  357 QTC Calculation: 447 R Axis:   68 Text Interpretation: Sinus rhythm Low voltage, precordial leads Confirmed by Gerlene Fee [phone]) on 10/02/2019 5:15:39 PM   Radiology Dg Chest Port 1 View  Result Date: 10/02/2019 CLINICAL DATA:  Cough for 2 weeks, nausea and back pain, unknown COVID-19 results. EXAM: PORTABLE CHEST 1 VIEW COMPARISON:  Radiograph April 25, 2015 FINDINGS: Persistent lung hyperinflation and coarsened interstitial changes and airways thickening compatible with history of COPD and asthma. No acute consolidative process. No pneumothorax or effusion. Increased attenuation in the left lung base contiguous with the left heart border likely reflects prominent pericardial fat pad. The cardiomediastinal contours are unremarkable. No acute osseous or soft tissue abnormality IMPRESSION: 1. No active cardiopulmonary disease. 2. Unchanged findings of COPD and asthma. 3. Increased attenuation in the left lung base contiguous with the left heart border likely reflects prominent pericardial fat pad. Electronically Signed   By: Lovena Le M.D.   On: 10/02/2019 14:44    Procedures Procedures (including critical care time)  Medications Ordered in ED Medications  sodium chloride 0.9 % bolus 1,000 mL (has no administration in time range)  ondansetron (ZOFRAN) injection 4 mg (has no administration in time range)      Initial Impression / Assessment and Plan / ED Course  I have reviewed the triage  vital signs and the nursing notes.  Pertinent labs & imaging results that were available during my care of the patient were reviewed by me and considered in my medical decision making (see chart for details).  Clinical Course as of Oct 01 1812  Mon Nov 16, 10654  1491 73 year old female here with 2 weeks of cough myalgias chest pain back pain.  Differential includes Covid, viral syndrome, flu, pneumonia, metabolic derangement.  Oxygen saturation borderline at 92% resting.  She is getting chest x-ray blood work EKG.  Will consider trending pulse ox and to get her testing back as she may need to be admitted.   [MB]  [id number] Chest x-ray interpreted by me as no gross infiltrates.  Awaiting radiology reading.   [MB]  1500 EKG is normal sinus rhythm rate of 94 low voltage nonspecific ST-T changes similar to prior 11/19.   [MB]  1503 Patient signed out to Dr. Freddi Che with plan to follow-up on labs and possibly trending pulse ox regarding admission decision.   [MB]    Clinical Course User Index [MB] Hayden Rasmussen, MD   Kelly Mooney was evaluated in Emergency Department on 10/02/2019 for the symptoms described in the history of present illness. She was evaluated in the context of the global COVID-19 pandemic, which necessitated consideration that the patient might be at risk for infection with the SARS-CoV-2 virus that causes COVID-19. Institutional protocols and algorithms that pertain to the evaluation of patients at risk for COVID-19 are in a state of rapid change based on information released by regulatory bodies including the CDC and federal and state organizations. These policies and algorithms were followed during the patient's care in the ED.      Final Clinical Impressions(s) / ED Diagnoses   Final diagnoses:  COPD exacerbation ([place])  Suspected COVID-19 virus infection    ED Discharge Orders    None        Hayden Rasmussen, MD 10/02/19 1815

## 2019-10-02 NOTE — ED Triage Notes (Signed)
Cough x 2 weeks. Nausea. Back pain. No known exposure to Covid. She had a covid test 3 days ago but no results. She started  Doxycycline a week ago.

## 2019-10-02 NOTE — ED Notes (Signed)
C/o generalized body aches, back pain and cough x 10 days

## 2019-10-02 NOTE — ED Notes (Signed)
Pt COVID-19 positive, MD Bero notified

## 2019-10-03 ENCOUNTER — Encounter (HOSPITAL_COMMUNITY): Payer: Self-pay

## 2019-10-03 DIAGNOSIS — U071 COVID-19: Secondary | ICD-10-CM | POA: Diagnosis present

## 2019-10-03 DIAGNOSIS — Z79899 Other long term (current) drug therapy: Secondary | ICD-10-CM | POA: Diagnosis not present

## 2019-10-03 DIAGNOSIS — J069 Acute upper respiratory infection, unspecified: Secondary | ICD-10-CM | POA: Diagnosis present

## 2019-10-03 DIAGNOSIS — J449 Chronic obstructive pulmonary disease, unspecified: Secondary | ICD-10-CM | POA: Diagnosis present

## 2019-10-03 DIAGNOSIS — N179 Acute kidney failure, unspecified: Secondary | ICD-10-CM | POA: Diagnosis present

## 2019-10-03 DIAGNOSIS — Z8249 Family history of ischemic heart disease and other diseases of the circulatory system: Secondary | ICD-10-CM | POA: Diagnosis not present

## 2019-10-03 DIAGNOSIS — R300 Dysuria: Secondary | ICD-10-CM | POA: Diagnosis present

## 2019-10-03 DIAGNOSIS — Z7984 Long term (current) use of oral hypoglycemic drugs: Secondary | ICD-10-CM | POA: Diagnosis not present

## 2019-10-03 DIAGNOSIS — Z833 Family history of diabetes mellitus: Secondary | ICD-10-CM | POA: Diagnosis not present

## 2019-10-03 DIAGNOSIS — E7800 Pure hypercholesterolemia, unspecified: Secondary | ICD-10-CM | POA: Diagnosis present

## 2019-10-03 DIAGNOSIS — J1289 Other viral pneumonia: Secondary | ICD-10-CM | POA: Diagnosis present

## 2019-10-03 DIAGNOSIS — Z79890 Hormone replacement therapy (postmenopausal): Secondary | ICD-10-CM | POA: Diagnosis not present

## 2019-10-03 DIAGNOSIS — Z8349 Family history of other endocrine, nutritional and metabolic diseases: Secondary | ICD-10-CM | POA: Diagnosis not present

## 2019-10-03 DIAGNOSIS — F329 Major depressive disorder, single episode, unspecified: Secondary | ICD-10-CM | POA: Diagnosis present

## 2019-10-03 DIAGNOSIS — E119 Type 2 diabetes mellitus without complications: Secondary | ICD-10-CM | POA: Diagnosis present

## 2019-10-03 DIAGNOSIS — R269 Unspecified abnormalities of gait and mobility: Secondary | ICD-10-CM | POA: Diagnosis present

## 2019-10-03 DIAGNOSIS — J441 Chronic obstructive pulmonary disease with (acute) exacerbation: Secondary | ICD-10-CM | POA: Diagnosis present

## 2019-10-03 DIAGNOSIS — J9601 Acute respiratory failure with hypoxia: Secondary | ICD-10-CM | POA: Diagnosis present

## 2019-10-03 DIAGNOSIS — Z8673 Personal history of transient ischemic attack (TIA), and cerebral infarction without residual deficits: Secondary | ICD-10-CM | POA: Diagnosis not present

## 2019-10-03 DIAGNOSIS — J440 Chronic obstructive pulmonary disease with acute lower respiratory infection: Secondary | ICD-10-CM | POA: Diagnosis present

## 2019-10-03 DIAGNOSIS — E785 Hyperlipidemia, unspecified: Secondary | ICD-10-CM | POA: Diagnosis present

## 2019-10-03 DIAGNOSIS — E039 Hypothyroidism, unspecified: Secondary | ICD-10-CM | POA: Diagnosis present

## 2019-10-03 DIAGNOSIS — Z7982 Long term (current) use of aspirin: Secondary | ICD-10-CM | POA: Diagnosis not present

## 2019-10-03 DIAGNOSIS — K219 Gastro-esophageal reflux disease without esophagitis: Secondary | ICD-10-CM | POA: Diagnosis present

## 2019-10-03 DIAGNOSIS — I10 Essential (primary) hypertension: Secondary | ICD-10-CM | POA: Diagnosis present

## 2019-10-03 DIAGNOSIS — Z825 Family history of asthma and other chronic lower respiratory diseases: Secondary | ICD-10-CM | POA: Diagnosis not present

## 2019-10-03 LAB — CBC
HCT: 37.1 % (ref 36.0–46.0)
Hemoglobin: 12.4 g/dL (ref 12.0–15.0)
MCH: 29.3 pg (ref 26.0–34.0)
MCHC: 33.4 g/dL (ref 30.0–36.0)
MCV: 87.7 fL (ref 80.0–100.0)
Platelets: 176 10*3/uL (ref 150–400)
RBC: 4.23 MIL/uL (ref 3.87–5.11)
RDW: 13.2 % (ref 11.5–15.5)
WBC: 2 10*3/uL — ABNORMAL LOW (ref 4.0–10.5)
nRBC: 0 % (ref 0.0–0.2)

## 2019-10-03 LAB — URINE CULTURE

## 2019-10-03 LAB — ABO/RH: ABO/RH(D): O NEG

## 2019-10-03 LAB — CREATININE, SERUM
Creatinine, Ser: 1.1 mg/dL — ABNORMAL HIGH (ref 0.44–1.00)
GFR calc Af Amer: 57 mL/min — ABNORMAL LOW (ref >60)
GFR calc non Af Amer: 49 mL/min — ABNORMAL LOW (ref >60)

## 2019-10-03 MED ORDER — GUAIFENESIN-DM 100-10 MG/5ML PO SYRP
10.0000 mL | ORAL_SOLUTION | ORAL | Status: DC | PRN
  Administered 2019-10-03 – 2019-10-06 (×8): 10 mL via ORAL
  Filled 2019-10-03 (×8): qty 10

## 2019-10-03 MED ORDER — TOPIRAMATE 25 MG PO TABS
50.0000 mg | ORAL_TABLET | Freq: Every day | ORAL | Status: DC
  Administered 2019-10-04: 50 mg via ORAL
  Filled 2019-10-03 (×4): qty 2

## 2019-10-03 MED ORDER — SODIUM CHLORIDE 0.9% FLUSH: 3.0000 mL | INTRAVENOUS | Status: DC | PRN

## 2019-10-03 MED ORDER — PANTOPRAZOLE SODIUM 40 MG PO TBEC
40.0000 mg | DELAYED_RELEASE_TABLET | Freq: Every day | ORAL | Status: DC
  Administered 2019-10-03 – 2019-10-06 (×4): 40 mg via ORAL
  Filled 2019-10-03 (×4): qty 1

## 2019-10-03 MED ORDER — DEXAMETHASONE SODIUM PHOSPHATE 10 MG/ML IJ SOLN
6.0000 mg | INTRAMUSCULAR | Status: DC
  Administered 2019-10-03 – 2019-10-06 (×4): 6 mg via INTRAVENOUS
  Filled 2019-10-03 (×4): qty 1

## 2019-10-03 MED ORDER — ATORVASTATIN CALCIUM 10 MG PO TABS
20.0000 mg | ORAL_TABLET | ORAL | Status: DC
  Administered 2019-10-03 – 2019-10-05 (×3): 20 mg via ORAL
  Filled 2019-10-03 (×5): qty 2

## 2019-10-03 MED ORDER — MONTELUKAST SODIUM 10 MG PO TABS
10.0000 mg | ORAL_TABLET | Freq: Every day | ORAL | Status: DC
  Administered 2019-10-03 – 2019-10-06 (×4): 10 mg via ORAL
  Filled 2019-10-03 (×4): qty 1

## 2019-10-03 MED ORDER — ESCITALOPRAM OXALATE 10 MG PO TABS
5.0000 mg | ORAL_TABLET | Freq: Every day | ORAL | Status: DC
  Administered 2019-10-04 – 2019-10-06 (×3): 5 mg via ORAL
  Filled 2019-10-03 (×4): qty 1

## 2019-10-03 MED ORDER — ONDANSETRON HCL 4 MG PO TABS: 4.0000 mg | ORAL_TABLET | Freq: Four times a day (QID) | ORAL | Status: DC | PRN

## 2019-10-03 MED ORDER — ZINC SULFATE 220 (50 ZN) MG PO CAPS
220.0000 mg | ORAL_CAPSULE | Freq: Every day | ORAL | Status: DC
  Administered 2019-10-03 – 2019-10-06 (×4): 220 mg via ORAL
  Filled 2019-10-03 (×4): qty 1

## 2019-10-03 MED ORDER — MENTHOL 3 MG MT LOZG
1.0000 | LOZENGE | OROMUCOSAL | Status: DC | PRN
  Administered 2019-10-03: 3 mg via ORAL
  Filled 2019-10-03: qty 9

## 2019-10-03 MED ORDER — AMLODIPINE BESYLATE 10 MG PO TABS
10.0000 mg | ORAL_TABLET | Freq: Every day | ORAL | Status: DC
  Administered 2019-10-03 – 2019-10-06 (×4): 10 mg via ORAL
  Filled 2019-10-03 (×5): qty 1

## 2019-10-03 MED ORDER — HYPROMELLOSE (GONIOSCOPIC) 2.5 % OP SOLN
1.0000 [drp] | OPHTHALMIC | Status: DC | PRN
  Filled 2019-10-03: qty 15

## 2019-10-03 MED ORDER — POLYVINYL ALCOHOL 1.4 % OP SOLN
1.0000 [drp] | OPHTHALMIC | Status: DC | PRN
  Filled 2019-10-03: qty 15

## 2019-10-03 MED ORDER — FENOFIBRATE 160 MG PO TABS
160.0000 mg | ORAL_TABLET | ORAL | Status: DC
  Administered 2019-10-03 – 2019-10-05 (×3): 160 mg via ORAL
  Filled 2019-10-03 (×4): qty 1

## 2019-10-03 MED ORDER — VITAMIN C 500 MG PO TABS
500.0000 mg | ORAL_TABLET | Freq: Every day | ORAL | Status: DC
  Administered 2019-10-03 – 2019-10-06 (×4): 500 mg via ORAL
  Filled 2019-10-03 (×4): qty 1

## 2019-10-03 MED ORDER — MOMETASONE FURO-FORMOTEROL FUM 200-5 MCG/ACT IN AERO
2.0000 | INHALATION_SPRAY | Freq: Two times a day (BID) | RESPIRATORY_TRACT | Status: DC
  Administered 2019-10-03 – 2019-10-06 (×6): 2 via RESPIRATORY_TRACT
  Filled 2019-10-03: qty 8.8

## 2019-10-03 MED ORDER — SODIUM CHLORIDE 0.9% FLUSH
3.0000 mL | Freq: Two times a day (BID) | INTRAVENOUS | Status: DC
  Administered 2019-10-03 – 2019-10-06 (×8): 3 mL via INTRAVENOUS

## 2019-10-03 MED ORDER — LEVOTHYROXINE SODIUM 137 MCG PO TABS
137.0000 ug | ORAL_TABLET | Freq: Every day | ORAL | Status: DC
  Administered 2019-10-04 – 2019-10-06 (×3): 137 ug via ORAL
  Filled 2019-10-03 (×4): qty 1

## 2019-10-03 MED ORDER — ONDANSETRON HCL 4 MG/2ML IJ SOLN: 4.0000 mg | Freq: Four times a day (QID) | INTRAMUSCULAR | Status: DC | PRN

## 2019-10-03 MED ORDER — HYDROCOD POLST-CPM POLST ER 10-8 MG/5ML PO SUER
5.0000 mL | Freq: Two times a day (BID) | ORAL | Status: DC | PRN
  Administered 2019-10-03 – 2019-10-05 (×6): 5 mL via ORAL
  Filled 2019-10-03 (×7): qty 5

## 2019-10-03 MED ORDER — ACETAMINOPHEN 325 MG PO TABS: 650.0000 mg | ORAL_TABLET | Freq: Four times a day (QID) | ORAL | Status: DC | PRN

## 2019-10-03 MED ORDER — SODIUM CHLORIDE 0.9 % IV SOLN: 250.0000 mL | INTRAVENOUS | Status: DC | PRN

## 2019-10-03 MED ORDER — ENOXAPARIN SODIUM 40 MG/0.4ML ~~LOC~~ SOLN
40.0000 mg | SUBCUTANEOUS | Status: DC
  Administered 2019-10-03 – 2019-10-05 (×3): 40 mg via SUBCUTANEOUS
  Filled 2019-10-03 (×3): qty 0.4

## 2019-10-03 NOTE — H&P (Signed)
History and Physical    Kelly Mooney [id number] DOB: [birth year] DOA: 10/02/2019  PCP: Lawerance Cruel, MD  Patient coming from: home  Chief Complaint:  Cough, fatigue  HPI: Kelly Mooney is a 73 y.o. female with medical history significant of gerd, asthma, copd, htn lives at home comes to ed with 2 weeks of cough, fatigue and not feeling well.  Seen by pcp given doxy and not better.  Sob worse over last several days.  89% with ambulation, cxr neg.  Referred for admission for hypoxia due to covid infection.  No cp, abd pain.  No le edema or swelling.  No n/v/d.   Review of Systems: As per HPI otherwise 10 point review of systems negative.   Past Medical History:  Diagnosis Date  . Asthma    since age 4  . Colon polyps   . COPD (chronic obstructive pulmonary disease) ([place])   . Diabetes mellitus   . Diverticulosis   . Esophageal stricture   . GERD (gastroesophageal reflux disease)   . Hiatal hernia   . Hypercholesteremia   . Hypertension   . Paraesophageal hernia   . Stroke (Kelly Mooney)   . Thyroid disease     Past Surgical History:  Procedure Laterality Date  . BUNIONECTOMY  10/2012   right foot  . CHOLECYSTECTOMY  1980  . ESOPHAGEAL MANOMETRY N/A 07/15/2015   Procedure: ESOPHAGEAL MANOMETRY (EM);  Surgeon: Irene Shipper, MD;  Location: [facility];  Service: Endoscopy;  Laterality: N/A;  . partail hysterectomy    . RHINOPLASTY     nasal polyps removed dr. Barrie Folk     reports that she has quit smoking. She has never used smokeless tobacco. She reports that she does not drink alcohol or use drugs.  Allergies  Allergen Reactions  . Chloramphenicol Anaphylaxis  . Ivp Dye [Iodinated Diagnostic Agents] Anaphylaxis, Shortness Of Breath and Other (See Comments)    SEVERE convulsions, too   . Crestor [Rosuvastatin Calcium]     Muscle cramps  . Iodine Itching  . Sulfa Antibiotics Rash    Family History  Problem Relation Age of Onset  . Diabetes Mellitus I Mother    . Hyperlipidemia Mother   . Asthma Mother   . Heart attack Father   . Heart attack Brother   . Asthma Brother   . Colon cancer Neg Hx   . Colon polyps Neg Hx     Prior to Admission medications   Medication Sig Start Date End Date Taking? Authorizing Provider  albuterol (PROVENTIL HFA;VENTOLIN HFA) 108 (90 BASE) MCG/ACT inhaler Inhale 2 puffs into the lungs 4 (four) times daily.    Yes [provider]  amLODipine (NORVASC) 10 MG tablet Take 10 mg by mouth daily.   Yes [provider]  aspirin 81 MG EC tablet Take 1 tablet (81 mg total) by mouth daily. Aspirin and Plavix for 3 weeks, followed by aspirin alone. 07/07/18  Yes Ghimire, Henreitta Leber, MD  atorvastatin (LIPITOR) 20 MG tablet Take 1 tablet (20 mg total) by mouth daily. 08/19/18  Yes McCue, Janett Billow, NP  clobetasol cream (TEMOVATE) [id number] % Apply 1 application topically See admin instructions. Apply a thin layer to affected areas 2 times a day as needed for irritation 02/17/16  Yes [provider]  fluocinonide (LIDEX) 0.05 % external solution Apply 1 application topically See admin instructions. Apply to affected areas daily as needed for itching   Yes [provider]  glimepiride (AMARYL) 4 MG tablet  Take 4 mg by mouth 2 (two) times daily.   Yes [provider]  levothyroxine (SYNTHROID, LEVOTHROID) 137 MCG tablet Take 137 mcg by mouth daily before breakfast.   Yes [provider]  metFORMIN (GLUCOPHAGE-XR) 500 MG 24 hr tablet Take 1,000 mg by mouth 2 (two) times daily. 04/04/18  Yes [provider]  montelukast (SINGULAIR) 10 MG tablet Take 10 mg by mouth daily.  03/21/15  Yes [provider]  pantoprazole (PROTONIX) 40 MG tablet TAKE 1 TABLET (40 MG TOTAL) BY MOUTH DAILY. 01/26/17  Yes Irene Shipper, MD  quinapril (ACCUPRIL) 40 MG tablet Take 40 mg by mouth daily.    Yes [provider]  escitalopram (LEXAPRO) 5 MG tablet Take 5 mg by mouth daily. 09/06/18    [provider]  topiramate (TOPAMAX) 50 MG tablet Take 1 tablet (50 mg total) by mouth daily. 09/28/18   Frann Rider, NP    Physical Exam: Vitals:   10/03/19 0145 10/03/19 0200 10/03/19 0215 10/03/19 0217  BP:  116/65 135/72   Pulse: 85 90 86   Resp: 17 19 20    Temp:    98.2 F (36.8 C)  TempSrc:    Oral  SpO2: 96% 94% 92%   Weight:      Height:          Constitutional: NAD, calm, comfortable Vitals:   10/03/19 0145 10/03/19 0200 10/03/19 0215 10/03/19 0217  BP:  116/65 135/72   Pulse: 85 90 86   Resp: 17 19 20    Temp:    98.2 F (36.8 C)  TempSrc:    Oral  SpO2: 96% 94% 92%   Weight:      Height:       Eyes: PERRL, lids and conjunctivae normal ENMT: Mucous membranes are moist. Posterior pharynx clear of any exudate or lesions.Normal dentition.  Neck: normal, supple, no masses, no thyromegaly Respiratory: clear to auscultation bilaterally, no wheezing, no crackles. Normal respiratory effort. No accessory muscle use.  Cardiovascular: Regular rate and rhythm, no murmurs / rubs / gallops. No extremity edema. 2+ pedal pulses. No carotid bruits.  Abdomen: no tenderness, no masses palpated. No hepatosplenomegaly. Bowel sounds positive.  Musculoskeletal: no clubbing / cyanosis. No joint deformity upper and lower extremities. Good ROM, no contractures. Normal muscle tone.  Skin: no rashes, lesions, ulcers. No induration Neurologic: CN 2-12 grossly intact. Sensation intact, DTR normal. Strength 5/5 in all 4.  Psychiatric: Normal judgment and insight. Alert and oriented x 3. Normal mood.    Labs on Admission: I have personally reviewed following labs and imaging studies  CBC: Recent Labs  Lab 10/02/19 1439  WBC 4.0  NEUTROABS 3.1  HGB 13.2  HCT 39.9  MCV 87.5  PLT [id number]   Basic Metabolic Panel: Recent Labs  Lab 10/02/19 1439  NA 139  K 3.2*  CL 104  CO2 22  GLUCOSE 120*  BUN 19  CREATININE 1.36*  CALCIUM 8.5*   GFR: Estimated Creatinine  Clearance: 33.2 mL/min (A) (by C-G formula based on SCr of 1.36 mg/dL (H)). Liver Function Tests: Recent Labs  Lab 10/02/19 1439  AST 38  ALT 23  ALKPHOS 38  BILITOT 1.0  PROT 7.1  ALBUMIN 3.6   No results for input(s): LIPASE, AMYLASE in the last 168 hours. No results for input(s): AMMONIA in the last 168 hours. Coagulation Profile: No results for input(s): INR, PROTIME in the last 168 hours. Cardiac Enzymes: No results for input(s): CKTOTAL, CKMB, CKMBINDEX, TROPONINI  in the last 168 hours. BNP (last 3 results) No results for input(s): PROBNP in the last 8760 hours. HbA1C: No results for input(s): HGBA1C in the last 72 hours. CBG: No results for input(s): GLUCAP in the last 168 hours. Lipid Profile: Recent Labs    10/02/19 1439  TRIG 168*   Thyroid Function Tests: No results for input(s): TSH, T4TOTAL, FREET4, T3FREE, THYROIDAB in the last 72 hours. Anemia Panel: Recent Labs    10/02/19 1439  FERRITIN 144   Urine analysis:    Component Value Date/Time   COLORURINE YELLOW 10/02/2019 1512   APPEARANCEUR CLEAR 10/02/2019 1512   LABSPEC >1.030 (H) 10/02/2019 1512   PHURINE 5.5 10/02/2019 1512   GLUCOSEU NEGATIVE 10/02/2019 1512   HGBUR NEGATIVE 10/02/2019 1512   BILIRUBINUR SMALL (A) 10/02/2019 1512   KETONESUR 15 (A) 10/02/2019 1512   PROTEINUR 30 (A) 10/02/2019 1512   NITRITE NEGATIVE 10/02/2019 1512   LEUKOCYTESUR NEGATIVE 10/02/2019 1512   Sepsis Labs: !!!!!!!!!!!!!!!!!!!!!!!!!!!!!!!!!!!!!!!!!!!! @LABRCNTIP (procalcitonin:4,lacticidven:4) ) Recent Results (from the past 240 hour(s))  SARS CORONAVIRUS 2 (TAT 6-24 HRS) Nasopharyngeal Nasopharyngeal Swab     Status: Abnormal   Collection Time: 10/02/19  3:12 PM   Specimen: Nasopharyngeal Swab  Result Value Ref Range Status   SARS Coronavirus 2 POSITIVE (A) NEGATIVE Final    Comment: RESULT CALLED TO, READ BACK BY AND VERIFIED WITH: L.ADKINES RN [phone] 10/02/2019 MCCORMICK K (NOTE) SARS-CoV-2 target nucleic  acids are DETECTED. The SARS-CoV-2 RNA is generally detectable in upper and lower respiratory specimens during the acute phase of infection. Positive results are indicative of active infection with SARS-CoV-2. Clinical  correlation with patient history and other diagnostic information is necessary to determine patient infection status. Positive results do  not rule out bacterial infection or co-infection with other viruses. The expected result is Negative. Fact Sheet for Patients: SugarRoll.be Fact Sheet for Healthcare Providers: https://www.woods-mathews.com/ This test is not yet approved or cleared by the [place] FDA and  has been authorized for detection and/or diagnosis of SARS-CoV-2 by FDA under an Emergency Use Authorization (EUA). This EUA will remain  in effect (meaning this test can be used)  for the duration of the COVID-19 declaration under Section 564(b)(1) of the Act, 21 U.S.C. section 360bbb-3(b)(1), unless the authorization is terminated or revoked sooner. Performed at [facility], [place] [address], [place], [place] [address]      Radiological Exams on Admission: Dg Chest Port 1 View  Result Date: 10/02/2019 CLINICAL DATA:  Cough for 2 weeks, nausea and back pain, unknown COVID-19 results. EXAM: PORTABLE CHEST 1 VIEW COMPARISON:  Radiograph April 25, 2015 FINDINGS: Persistent lung hyperinflation and coarsened interstitial changes and airways thickening compatible with history of COPD and asthma. No acute consolidative process. No pneumothorax or effusion. Increased attenuation in the left lung base contiguous with the left heart border likely reflects prominent pericardial fat pad. The cardiomediastinal contours are unremarkable. No acute osseous or soft tissue abnormality IMPRESSION: 1. No active cardiopulmonary disease. 2. Unchanged findings of COPD and asthma. 3. Increased attenuation in the left lung base contiguous with  the left heart border likely reflects prominent pericardial fat pad. Electronically Signed   By: Lovena Le M.D.   On: 10/02/2019 14:44   Old chart reviewed Case discussed with edp  cxr reviewed no infiltrate or edema  Assessment/Plan 73 yo female with mild hypoxia with covid infection with neg cxr  Principal Problem:   COVID-19 virus infection- will place on decadron only at this  time.  Feels better with 2 liters 02.  Vit c/zinc and lovenox ordered.  Inflammatory markers ordered and will be trended. If worsens consider remdisivir.  Active Problems:   Type 2 diabetes mellitus without complication, with long-term current use of insulin ([place])- ssi    Hypothyroidism- clarify home meds    Essential hypertension- clarify home meds     DVT prophylaxis: lovenox Code Status:  full Family Communication:  none Disposition Plan:  days Consults called:  none Admission status:  admission   Lynnmarie Lovett A MD [facility]  If 7PM-7AM, please contact night-coverage [email or web address] Password [email or web address]  10/03/2019, 3:13 AM

## 2019-10-03 NOTE — Progress Notes (Addendum)
[id number]: Assumed care of Pt from night RN. Pt assessed, denies pain, states just a little pressure in her chest from coughing. VSS, SpO2 95% on 2 L [place]. Denies other needs at this time, call bell within reach  1000: Daughter updated via phone. All questions answered.  1200: Pt resting comfortably in chair. Call bell within reach  1530: Assisted to BR and back to bed. While ambulating on room air, SpO2 remained >91%. Tussinex given to coughing fit per Pt request. Pt comfortable back to bed, call bell within reach.  1800: Pt resting in bed still working on dinner. Denies needs at this time. Call bell within reach

## 2019-10-03 NOTE — Progress Notes (Signed)
PROGRESS NOTE  Brief Narrative: Kelly Mooney is a 73 y.o. female who presented from home alone to ED 11/16 with myriad symptoms consistent with viral syndrome ongoing for 2 weeks despite outpatient Tx with doxycycline. Work up was largely reassuring with negative CXR, though she remained very symptomatic/dyspneic and weak on exertion with SpO2 down to 90%. SARS-CoV-2 test was positive and patient felt to be poor candidate for outpatient management, so she was admitted this morning by Dr. Shanon Brow. Steroids started.  Subjective: Feels better than she did when she got here. Has not gotten up since arrival. Still feels diffusely weak. No chest pain.   Objective: BP (!) 127/59 (BP Location: Left Arm)   Pulse 79   Temp 98 F (36.7 C) (Oral)   Resp 17   Ht _0  (1.575 m)   Wt 64.7 kg   SpO2 96%   BMI 26.09 kg/m   Gen: Elderly female in no acute distress Pulm: Clear and nonlabored on 2L O2  CV: RRR, no murmur, no JVD, no edema GI: Soft, NT, ND, +BS  Neuro: Alert and oriented. No focal deficits. Skin: No rashes, lesions or ulcers  Assessment & Plan: Acute hypoxic respiratory failure due to covid-19: Without CXR infiltrate, not technically hypoxic, though placed on 2L for comfort. Pt lives alone and is at very high risk of readmission if discharged before symptomatic improvement and increased strength/independence.  - Continue steroids.  - Disease course (>2 weeks of illness) is unlikely to be changed by antiviral at this point. Will not start remdesivir, but have low threshold for addition of this or CCP. - Continue airborne, contact precautions.  - Enoxaparin prophylactic dose.  - Maintain euvolemia/net negative.  - Avoid NSAIDs - Vitamin C, zinc - Recommend proning and aggressive use of incentive spirometry. - PT/OT ordered.   COPD:  - Continue singulair, advair formulary equivalent, albuterol MDI  NIDT2DM:  - Hold home metformin and [facility]  - Continue SSI. Monitor for  steroid-induced hyperglycemia.    Dysuria: Reported in ED. Urine culture no growth.  - Continue to monitor.   Hypothyroidism:  - Restart synthroid  HTN:  - Restart home norvasc. Holding ACEi, thiazide due to AKI  AKI:  - Improving. Hold diuretic, ACE and monitor in AM.  Depression:  - Continue home escitalopram, topiramate  GERD:  - Continue PPI  HLD:  - Continue statin   Patrecia Pour, MD Pager on amion 10/03/2019, 9:22 AM

## 2019-10-04 LAB — CBC WITH DIFFERENTIAL/PLATELET
Abs Immature Granulocytes: 0.02 10*3/uL (ref 0.00–0.07)
Basophils Absolute: 0 10*3/uL (ref 0.0–0.1)
Basophils Relative: 0 %
Eosinophils Absolute: 0 10*3/uL (ref 0.0–0.5)
Eosinophils Relative: 0 %
HCT: 35.9 % — ABNORMAL LOW (ref 36.0–46.0)
Hemoglobin: 11.7 g/dL — ABNORMAL LOW (ref 12.0–15.0)
Immature Granulocytes: 0 %
Lymphocytes Relative: 18 %
Lymphs Abs: 1.1 10*3/uL (ref 0.7–4.0)
MCH: 28.6 pg (ref 26.0–34.0)
MCHC: 32.6 g/dL (ref 30.0–36.0)
MCV: 87.8 fL (ref 80.0–100.0)
Monocytes Absolute: 0.3 10*3/uL (ref 0.1–1.0)
Monocytes Relative: 6 %
Neutro Abs: 4.6 10*3/uL (ref 1.7–7.7)
Neutrophils Relative %: 76 %
Platelets: 220 10*3/uL (ref 150–400)
RBC: 4.09 MIL/uL (ref 3.87–5.11)
RDW: 13.2 % (ref 11.5–15.5)
WBC: 6 10*3/uL (ref 4.0–10.5)
nRBC: 0 % (ref 0.0–0.2)

## 2019-10-04 LAB — COMPREHENSIVE METABOLIC PANEL
ALT: 20 U/L (ref 0–44)
AST: 34 U/L (ref 15–41)
Albumin: 3.1 g/dL — ABNORMAL LOW (ref 3.5–5.0)
Alkaline Phosphatase: 33 U/L — ABNORMAL LOW (ref 38–126)
Anion gap: 12 (ref 5–15)
BUN: 25 mg/dL — ABNORMAL HIGH (ref 8–23)
CO2: 26 mmol/L (ref 22–32)
Calcium: 8.6 mg/dL — ABNORMAL LOW (ref 8.9–10.3)
Chloride: 105 mmol/L (ref 98–111)
Creatinine, Ser: 1.03 mg/dL — ABNORMAL HIGH (ref 0.44–1.00)
GFR calc Af Amer: >60 mL/min (ref >60)
GFR calc non Af Amer: 54 mL/min — ABNORMAL LOW (ref >60)
Glucose, Bld: 191 mg/dL — ABNORMAL HIGH (ref 70–99)
Potassium: 3.5 mmol/L (ref 3.5–5.1)
Sodium: 143 mmol/L (ref 135–145)
Total Bilirubin: 0.6 mg/dL (ref 0.3–1.2)
Total Protein: 6.2 g/dL — ABNORMAL LOW (ref 6.5–8.1)

## 2019-10-04 LAB — D-DIMER, QUANTITATIVE: D-Dimer, Quant: 0.54 ug/mL-FEU — ABNORMAL HIGH (ref 0.00–0.50)

## 2019-10-04 LAB — C-REACTIVE PROTEIN: CRP: 1.3 mg/dL — ABNORMAL HIGH (ref <1.0)

## 2019-10-04 NOTE — Evaluation (Signed)
Physical Therapy Evaluation Patient Details Name: Kelly Mooney MRN: [id number] DOB: 1945-12-20 Today's Date: 10/04/2019   History of Present Illness  73 y.o. female who presented from home alone to ED 11/16 with myriad symptoms consistent with viral syndrome ongoing for 2 weeks despite outpatient Tx with doxycycline. Work up was largely reassuring with negative CXR, though she remained very symptomatic/dyspneic and weak on exertion with SpO2 down to 90%. SARS-CoV-2 test was positive . QHU:TMLY; DM II; HTN;   Clinical Impression   The patient  Expresses concern for Dc too early. Patient will benefit from a 4 wheeled RW for ability to rest  While in home alone. The patient did ambulate on RA with SPO2 92 %.Patient may benefit from HHPT to ensure progressive mobility within her home. Pt admitted with above diagnosis.   Pt currently with functional limitations due to the deficits listed below (see PT Problem List). Pt will benefit from skilled PT to increase their independence and safety with mobility to allow discharge to the venue listed below.       Follow Up Recommendations Home health PT(pt. lives alone)    Equipment Recommendations  Rolling walker w/ 4  wheels/rollator)    Recommendations for Other Services       Precautions / Restrictions Precautions Precaution Comments: gets SOB, monitopr SPO2 on RA      Mobility  Bed Mobility Overal bed mobility: Independent                Transfers Overall transfer level: Needs assistance   Transfers: Sit to/from Stand Sit to Stand: Supervision         General transfer comment: some  effort from recliner to power up  Ambulation/Gait Ambulation/Gait assistance: Min guard Gait Distance (Feet): 200 Feet Assistive device: 4-wheeled walker Gait Pattern/deviations: Step-through pattern Gait velocity: decr   General Gait Details: gait is slow and steady using RW. Stopped x 1 for rest, encouraged pursed lip breaths, frequent  coughing.  Stairs            Wheelchair Mobility    Modified Rankin (Stroke Patients Only)       Balance Overall balance assessment: Needs assistance   Sitting balance-Leahy Scale: Normal     Standing balance support: No upper extremity supported Standing balance-Leahy Scale: Fair                               Pertinent Vitals/Pain Faces Pain Scale: Hurts a little bit Pain Location: chest with coughing Pain Descriptors / Indicators: Discomfort Pain Intervention(s): Monitored during session    Home Living Family/patient expects to be discharged to:: Private residence Living Arrangements: Alone Available Help at Discharge: Neighbor Type of Home: House Home Access: Stairs to enter   Technical brewer of Steps: 1 Home Layout: One level Home Equipment: Shower seat - built in;Hand held shower head      Prior Function Level of Independence: Independent               Journalist, newspaper        Extremity/Trunk Assessment   Upper Extremity Assessment Upper Extremity Assessment: Overall WFL for tasks assessed    Lower Extremity Assessment Lower Extremity Assessment: Generalized weakness    Cervical / Trunk Assessment Cervical / Trunk Assessment: Normal  Communication   Communication: No difficulties  Cognition Arousal/Alertness: Awake/alert Behavior During Therapy: WFL for tasks assessed/performed Overall Cognitive Status: Within Functional Limits for tasks assessed  General Comments      Exercises     Assessment/Plan    PT Assessment Patient needs continued PT services  PT Problem List Decreased strength;Decreased mobility;Decreased knowledge of precautions;Decreased activity tolerance;Cardiopulmonary status limiting activity;Decreased balance;Decreased knowledge of use of DME       PT Treatment Interventions DME instruction;Therapeutic activities;Gait training    PT  Goals (Current goals can be found in the Care Plan section)  Acute Rehab PT Goals Patient Stated Goal: to go home when she feels better PT Goal Formulation: With patient Time For Goal Achievement: 10/18/19 Potential to Achieve Goals: Good    Frequency Min 3X/week   Barriers to discharge Decreased caregiver support      Co-evaluation               AM-PAC PT "6 Clicks" Mobility  Outcome Measure Help needed turning from your back to your side while in a flat bed without using bedrails?: None Help needed moving from lying on your back to sitting on the side of a flat bed without using bedrails?: None Help needed moving to and from a bed to a chair (including a wheelchair)?: A Little Help needed standing up from a chair using your arms (e.g., wheelchair or bedside chair)?: A Little Help needed to walk in hospital room?: A Little Help needed climbing 3-5 steps with a railing? : A Little 6 Click Score: 20    End of Session   Activity Tolerance: Patient tolerated treatment well Patient left: in bed;with call bell/phone within reach Nurse Communication: Mobility status PT Visit Diagnosis: Difficulty in walking, not elsewhere classified (R26.2)    Time: 8119-1478 PT Time Calculation (min) (ACUTE ONLY): 36 min   Charges:   PT Evaluation $PT Eval Low Complexity: 1 Low PT Treatments $Gait Training: 8-22 mins        [place]  Office [phone]   Claretha Cooper 10/04/2019, 5:06 PM

## 2019-10-04 NOTE — Progress Notes (Addendum)
Occupational Therapy Evaluation  PTA, pt independent with ADL and mobility and lived alone. Pt states she has limited support after DC is is concerned about "going home too soon". SpO2 remained above 90 during ADL and mobility however pt demonstrates 3/4 DOE with increased anxiety due to feeling SOB. Educated pt on use of flutter valve, incentive spirometer and alternative positions to encourage proning. Began education on anxiety management and relaxation techniques.Pt began theraband level 1 HEP.  Recommend HHOT at [facility]. Pt very appreciative. Will follow acutely.      10/04/19 0951  OT Visit Information  Last OT Received On 10/04/19  Assistance Needed +1  History of Present Illness 73 y.o. female who presented from home alone to ED 11/16 with myriad symptoms consistent with viral syndrome ongoing for 2 weeks despite outpatient Tx with doxycycline. Work up was largely reassuring with negative CXR, though she remained very symptomatic/dyspneic and weak on exertion with SpO2 down to 90%. SARS-CoV-2 test was positive . FHL:KTGY; DM II; HTN;   Precautions  Precautions None  Home Living  Family/patient expects to be discharged to: Private residence  Living Arrangements Alone  Available Help at Discharge Neighbor (Sister may be able to assist)  Type of [place] to enter  Entrance Stairs-Number of Steps 1  [place] One level  Bathroom Shower/Tub Walk-in shower  [place] height  [place] held shower head  Prior Function  Level of Independence Independent  Comments  (Independent with ADLs, IADLs, and mobility. Still drives.)  Communication  Communication No difficulties  Pain Assessment  Pain Assessment Faces  Faces Pain Scale 2  Pain Location chest with coughing  Pain Descriptors / Indicators Discomfort  Pain Intervention(s) Limited activity within patient's tolerance  Cognition   Arousal/Alertness Awake/alert  Upper Extremity Assessment  Upper Extremity Assessment Overall WFL for tasks assessed  Lower Extremity Assessment  Lower Extremity Assessment Defer to PT evaluation  Cervical / Trunk Assessment  Cervical / Trunk Assessment Normal  ADL  Overall ADL's  Needs assistance/impaired  Functional mobility during ADLs Supervision/safety  General ADL Comments Pt initially unsteady at times with mobility due to weakness and WOB. Pt furntiure walking at times. Becomes easily SOB with 3/4 DOE. Began educating pt on activity modification and energy conservation. SpO2 low 90s.   Bed Mobility  Overal bed mobility Independent  Transfers  Overall transfer level Needs assistance  Transfers Sit to/from Stand  Sit to Stand Supervision  General transfer comment May benefit form use of rollaotr to assist with managing ADL  Balance  Overall balance assessment Needs assistance  Sitting balance-Leahy Scale Good  Standing balance-Leahy Scale Fair  Exercises  Exercises Other exercises  Other Exercises  Other Exercises flutter valve x 10  Other Exercises incentive spirometer x 10  Other Exercises Began level 1 therabnad - handout issued  Other Exercises Educated on importance of trying to prone; pt has difficulty proning therefore encouraged sidelying  OT - End of Session  Activity Tolerance Patient tolerated treatment well  Patient left in chair;with call bell/phone within reach  Nurse Communication Mobility status  OT Assessment  OT Recommendation/Assessment Patient needs continued OT Services  OT Visit Diagnosis Unsteadiness on feet (R26.81);Muscle weakness (generalized) (M62.81);Pain  Pain - part of body  (chest)  OT Problem List Decreased strength;Decreased activity tolerance;Impaired balance (sitting and/or standing);Decreased knowledge of use of DME or AE;Cardiopulmonary status limiting activity;Pain  OT Plan  OT  Frequency (ACUTE ONLY) Min 3X/week  OT  Treatment/Interventions (ACUTE ONLY) Self-care/ADL training;Therapeutic exercise;Neuromuscular education;Energy conservation;DME and/or AE instruction;Therapeutic activities;Patient/family education  AM-PAC OT "6 Clicks" Daily Activity Outcome Measure (Version 2)  Help from another person eating meals? 4  Help from another person taking care of personal grooming? 4  Help from another person toileting, which includes using toliet, bedpan, or urinal? 4  Help from another person bathing (including washing, rinsing, drying)? 4  Help from another person to put on and taking off regular upper body clothing? 4  Help from another person to put on and taking off regular lower body clothing? 3  6 Click Score 23  OT Recommendation  Follow Up Recommendations Home health OT;Supervision - Intermittent  OT Equipment None recommended by OT  Individuals Consulted  Consulted and Agree with Results and Recommendations Patient  Acute Rehab OT Goals  Patient Stated Goal to go home when she feels better  OT Goal Formulation With patient  Time For Goal Achievement 10/18/19  Potential to Achieve Goals Good  OT Time Calculation  OT Start Time (ACUTE ONLY) 0939  OT Stop Time (ACUTE ONLY) 1020  OT Time Calculation (min) 41 min  OT General Charges  $OT Visit 1 Visit  OT Evaluation  $OT Eval Moderate Complexity 1 Mod  OT Treatments  $Self Care/Home Management  23-37 mins  Written Expression  Dominant Hand Right  Maurie Boettcher, OT/L   Acute OT Clinical Specialist [place] Pager [phone] Office [phone]

## 2019-10-04 NOTE — Progress Notes (Signed)
PROGRESS NOTE    Kelly Mooney  DXI:[id number] DOB: 1946/09/11 DOA: 10/02/2019 PCP: Lawerance Cruel, MD   Brief Narrative: Kelly Mooney is a 73 y.o. female who presented from home alone to ED 11/16 with myriad symptoms consistent with viral syndrome ongoing for 2 weeks despite outpatient Tx with doxycycline. Work up was largely reassuring with negative CXR, though she remained very symptomatic/dyspneic and weak on exertion with SpO2 down to 90%. SARS-CoV-2 test was positive and patient felt to be poor candidate for outpatient management, so she was admitted this morning by Dr. Shanon Brow. Steroids started.   Subjective: No acute issues or events overnight, patient continues to feel remarkably weak, fatigued even with minimal exertion.  Pain, nausea, vomiting, diarrhea, constipation, headache, fevers, chills.   Assessment & Plan:   Principal Problem:   COVID-19 virus infection Active Problems:   Type 2 diabetes mellitus without complication, with long-term current use of insulin (HCC)   Hypothyroidism   Essential hypertension   COPD (chronic obstructive pulmonary disease) (HCC)   AKI (acute kidney injury) (HCC)   GERD (gastroesophageal reflux disease)   Hypercholesteremia   Acute respiratory disease due to COVID-19 virus   Acute hypoxic respiratory failure due to covid-19:  -Without CXR infiltrate -Incidentally hypoxic, on 2L admission, now on room air -Patient continues to be remarkably weak and dyspneic with PT OT - Continue steroids.  - Disease course (>2 weeks of illness) is unlikely to be changed by antiviral/plasma - Continue airborne, contact precautions.  - Enoxaparin prophylactic dose.  - Maintain euvolemia/net negative.  - Avoid NSAIDs - Vitamin C, zinc - Recommend proning and aggressive use of incentive spirometry. - PT/OT ordered -continue to follow  AKI, POA, resolving:  - Improving with increased p.o. intake. Hold diuretic, ACE  COPD, unlikely an  acute exacerbation:  - Continue singulair, advair formulary equivalent, albuterol MDI  NIDT2DM:  - Hold home metformin and [facility]  - Continue SSI. Monitor for steroid-induced hyperglycemia.   Dysuria: - Reported in ED. Urine culture no growth.  - Continue to monitor.   Hypothyroidism:  -Continue synthroid  HTN:  - Restart home norvasc. Holding ACEi, thiazide due to AKI  Depression:  - Continue home escitalopram, topiramate  GERD:  - Continue PPI  HLD:  - Continue statin    DVT prophylaxis: Lovenox Code Status: Full Family Communication: Unavailable by phone Disposition Plan: Pending clinical status, likely discharge home in the next 24 to 48 hours with or without home health and physical therapy pending further evaluation   Objective: Vitals:   10/03/19 1531 10/03/19 2005 10/04/19 0408 10/04/19 0755  BP: (!) 151/60 (!) 138/57 (!) 132/58 (!) 135/46  Pulse: 94   78  Resp: (!) 21   20  Temp: 98.1 F (36.7 C) 98.2 F (36.8 C) 97.9 F (36.6 C) 98.6 F (37 C)  TempSrc: Oral Oral Oral Oral  SpO2: 93%   94%  Weight:      Height:        Intake/Output Summary (Last 24 hours) at 10/04/2019 1200 Last data filed at 10/04/2019 0900 Gross per 24 hour  Intake 480 ml  Output -  Net 480 ml   Filed Weights   10/02/19 1337 10/03/19 0313  Weight: 69.9 kg 64.7 kg    Examination: General:  Pleasantly resting in bed, No acute distress. HEENT:  Normocephalic atraumatic.  Sclerae nonicteric, noninjected.  Extraocular movements intact bilaterally. Neck:  Without mass or deformity.  Trachea is midline. Lungs:  Clear  to auscultate bilaterally without rhonchi, wheeze, or rales. Heart:  Regular rate and rhythm.  Without murmurs, rubs, or gallops. Abdomen:  Soft, nontender, nondistended.  Without guarding or rebound. Extremities: Without cyanosis, clubbing, edema, or obvious deformity. Vascular:  Dorsalis pedis and posterior tibial pulses palpable bilaterally. Skin:  Warm and dry,  no erythema, no ulcerations.  Data Reviewed: I have personally reviewed following labs and imaging studies  CBC: Recent Labs  Lab 10/02/19 1439 10/03/19 0651 10/04/19 0020  WBC 4.0 2.0* 6.0  NEUTROABS 3.1  --  4.6  HGB 13.2 12.4 11.7*  HCT 39.9 37.1 35.9*  MCV 87.5 87.7 87.8  PLT 184 176 322   Basic Metabolic Panel: Recent Labs  Lab 10/02/19 1439 10/03/19 0650 10/04/19 0020  NA 139  --  143  K 3.2*  --  3.5  CL 104  --  105  CO2 22  --  26  GLUCOSE 120*  --  191*  BUN 19  --  25*  CREATININE 1.36* 1.10* 1.03*  CALCIUM 8.5*  --  8.6*   GFR: Estimated Creatinine Clearance: 42.3 mL/min (A) (by C-G formula based on SCr of 1.03 mg/dL (H)).   Liver Function Tests: Recent Labs  Lab 10/02/19 1439 10/04/19 0020  AST 38 34  ALT 23 20  ALKPHOS 38 33*  BILITOT 1.0 0.6  PROT 7.1 6.2*  ALBUMIN 3.6 3.1*   Lipid Profile: Recent Labs    10/02/19 1439  TRIG 168*   Anemia Panel: Recent Labs    10/02/19 1439  FERRITIN 144   Sepsis Labs: Recent Labs  Lab 10/02/19 1424 10/02/19 1439 10/02/19 1629  PROCALCITON  --  <0.10  --   LATICACIDVEN 1.8  --  1.7    Recent Results (from the past 240 hour(s))  Blood Culture (routine x 2)     Status: None (Preliminary result)   Collection Time: 10/02/19  2:50 PM   Specimen: BLOOD  Result Value Ref Range Status   Specimen Description   Final    BLOOD BLOOD RIGHT ARM Performed at [facility], [address] [place], [place] [address]    Special Requests   Final    BOTTLES DRAWN AEROBIC AND ANAEROBIC Blood Culture adequate volume Performed at [facility], [address] [place], [place] [address]    Culture   Final    NO GROWTH 2 DAYS Performed at [facility], [place] [address], [place], [place] [address]    Report Status PENDING  Incomplete  Blood Culture (routine x 2)     Status: None (Preliminary result)   Collection Time: 10/02/19  3:12 PM   Specimen: BLOOD  Result Value Ref Range  Status   Specimen Description   Final    BLOOD BLOOD LEFT WRIST Performed at [facility], [address] [place], [place] [address]    Special Requests   Final    BOTTLES DRAWN AEROBIC AND ANAEROBIC Blood Culture adequate volume Performed at [facility], [address] [place], [place] [address]    Culture   Final    NO GROWTH 2 DAYS Performed at [facility], [place] [address] [place], [place] [address]    Report Status PENDING  Incomplete  Urine culture     Status: Abnormal   Collection Time: 10/02/19  3:12 PM   Specimen: Urine, Random  Result Value Ref Range Status   Specimen Description  Final    URINE, RANDOM Performed at [facility], [address] [place], [place] [address]    Special Requests   Final    NONE Performed at [facility], [address] [place], [place] [address]    Culture MULTIPLE SPECIES PRESENT, SUGGEST RECOLLECTION (A)  Final   Report Status 10/03/2019 FINAL  Final  SARS CORONAVIRUS 2 (TAT 6-24 HRS) Nasopharyngeal Nasopharyngeal Swab     Status: Abnormal   Collection Time: 10/02/19  3:12 PM   Specimen: Nasopharyngeal Swab  Result Value Ref Range Status   SARS Coronavirus 2 POSITIVE (A) NEGATIVE Final    Comment: RESULT CALLED TO, READ BACK BY AND VERIFIED WITH: L.ADKINES RN [phone] 10/02/2019 MCCORMICK K (NOTE) SARS-CoV-2 target nucleic acids are DETECTED. The SARS-CoV-2 RNA is generally detectable in upper and lower respiratory specimens during the acute phase of infection. Positive results are indicative of active infection with SARS-CoV-2. Clinical  correlation with patient history and other diagnostic information is necessary to determine patient infection status. Positive results do  not rule out bacterial infection or co-infection with other viruses. The expected result is Negative. Fact Sheet for Patients: SugarRoll.be Fact Sheet for Healthcare Providers:  https://www.woods-mathews.com/ This test is not yet approved or cleared by the [place] FDA and  has been authorized for detection and/or diagnosis of SARS-CoV-2 by FDA under an Emergency Use Authorization (EUA). This EUA will remain  in effect (meaning this test can be used)  for the duration of the COVID-19 declaration under Section 564(b)(1) of the Act, 21 U.S.C. section 360bbb-3(b)(1), unless the authorization is terminated or revoked sooner. Performed at [facility], [place] [address], [place], [place] [address]      Radiology Studies: Dg Chest Port 1 View  Result Date: 10/02/2019 CLINICAL DATA:  Cough for 2 weeks, nausea and back pain, unknown COVID-19 results. EXAM: PORTABLE CHEST 1 VIEW COMPARISON:  Radiograph April 25, 2015 FINDINGS: Persistent lung hyperinflation and coarsened interstitial changes and airways thickening compatible with history of COPD and asthma. No acute consolidative process. No pneumothorax or effusion. Increased attenuation in the left lung base contiguous with the left heart border likely reflects prominent pericardial fat pad. The cardiomediastinal contours are unremarkable. No acute osseous or soft tissue abnormality IMPRESSION: 1. No active cardiopulmonary disease. 2. Unchanged findings of COPD and asthma. 3. Increased attenuation in the left lung base contiguous with the left heart border likely reflects prominent pericardial fat pad. Electronically Signed   By: Lovena Le M.D.   On: 10/02/2019 14:44    Scheduled Meds: . albuterol  2 puff Inhalation QID  . amLODipine  10 mg Oral Daily  . atorvastatin  20 mg Oral Q24H  . dexamethasone (DECADRON) injection  6 mg Intravenous Q24H  . enoxaparin (LOVENOX) injection  40 mg Subcutaneous Q24H  . escitalopram  5 mg Oral Daily  . fenofibrate  160 mg Oral Q24H  . levothyroxine  137 mcg Oral QAC breakfast  . mometasone-formoterol  2 puff Inhalation BID  . montelukast  10 mg Oral Daily  .  pantoprazole  40 mg Oral Daily  . sodium chloride flush  3 mL Intravenous Q12H  . topiramate  50 mg Oral Daily  . vitamin C  500 mg Oral Daily  . zinc sulfate  220 mg Oral Daily   Continuous Infusions: . sodium chloride       LOS: 1 day   Time spent: 41mn  Awa Bachicha C Gaius Ishaq, DO  [facility]  If 7PM-7AM, please contact night-coverage [email or web address] Password [email or web address] 10/04/2019, 12:00 PM

## 2019-10-04 NOTE — Plan of Care (Addendum)
Continue to experience dry cough. New order cepacol for sore throat d/t coughing. Educated daughter on pt's status.  Problem: Education: Goal: Knowledge of risk factors and measures for prevention of condition will improve Outcome: Progressing   Problem: Coping: Goal: Psychosocial and spiritual needs will be supported Outcome: Progressing   Problem: Respiratory: Goal: Will maintain a patent airway Outcome: Progressing Goal: Complications related to the disease process, condition or treatment will be avoided or minimized Outcome: Progressing

## 2019-10-05 LAB — COMPREHENSIVE METABOLIC PANEL
ALT: 20 U/L (ref 0–44)
AST: 30 U/L (ref 15–41)
Albumin: 2.9 g/dL — ABNORMAL LOW (ref 3.5–5.0)
Alkaline Phosphatase: 33 U/L — ABNORMAL LOW (ref 38–126)
Anion gap: 11 (ref 5–15)
BUN: 28 mg/dL — ABNORMAL HIGH (ref 8–23)
CO2: 25 mmol/L (ref 22–32)
Calcium: 8.4 mg/dL — ABNORMAL LOW (ref 8.9–10.3)
Chloride: 104 mmol/L (ref 98–111)
Creatinine, Ser: 1.05 mg/dL — ABNORMAL HIGH (ref 0.44–1.00)
GFR calc Af Amer: >60 mL/min (ref >60)
GFR calc non Af Amer: 52 mL/min — ABNORMAL LOW (ref >60)
Glucose, Bld: 197 mg/dL — ABNORMAL HIGH (ref 70–99)
Potassium: 3.7 mmol/L (ref 3.5–5.1)
Sodium: 140 mmol/L (ref 135–145)
Total Bilirubin: 0.7 mg/dL (ref 0.3–1.2)
Total Protein: 5.8 g/dL — ABNORMAL LOW (ref 6.5–8.1)

## 2019-10-05 LAB — CBC WITH DIFFERENTIAL/PLATELET
Abs Immature Granulocytes: 0.06 10*3/uL (ref 0.00–0.07)
Basophils Absolute: 0 10*3/uL (ref 0.0–0.1)
Basophils Relative: 0 %
Eosinophils Absolute: 0 10*3/uL (ref 0.0–0.5)
Eosinophils Relative: 0 %
HCT: 33.7 % — ABNORMAL LOW (ref 36.0–46.0)
Hemoglobin: 11.3 g/dL — ABNORMAL LOW (ref 12.0–15.0)
Immature Granulocytes: 1 %
Lymphocytes Relative: 19 %
Lymphs Abs: 1.1 10*3/uL (ref 0.7–4.0)
MCH: 29.4 pg (ref 26.0–34.0)
MCHC: 33.5 g/dL (ref 30.0–36.0)
MCV: 87.5 fL (ref 80.0–100.0)
Monocytes Absolute: 0.3 10*3/uL (ref 0.1–1.0)
Monocytes Relative: 6 %
Neutro Abs: 4.5 10*3/uL (ref 1.7–7.7)
Neutrophils Relative %: 74 %
Platelets: 228 10*3/uL (ref 150–400)
RBC: 3.85 MIL/uL — ABNORMAL LOW (ref 3.87–5.11)
RDW: 12.9 % (ref 11.5–15.5)
WBC: 6 10*3/uL (ref 4.0–10.5)
nRBC: 0 % (ref 0.0–0.2)

## 2019-10-05 LAB — C-REACTIVE PROTEIN: CRP: <0.8 mg/dL (ref <1.0)

## 2019-10-05 LAB — D-DIMER, QUANTITATIVE: D-Dimer, Quant: 0.38 ug/mL-FEU (ref 0.00–0.50)

## 2019-10-05 NOTE — Progress Notes (Signed)
Occupational Therapy Treatment Note  Pt making good progress and states she feels much better. Reviewed energy conservation strategies and activity modification given pt lives alone and will have limited help after DC. SpO2 remaining above 90 during ADL. Pt concerned about being around others since she has Covid. Educated pt on viral transmission regarding CDC guidelines. Pt reports feeling "much better" after our discussion.  Also educated pt on anxiety management and relaxation techniques. Pt very appreciative.     10/05/19 1000  OT Visit Information  Last OT Received On 10/05/19  Assistance Needed +1  History of Present Illness 73 y.o. female who presented from home alone to ED 11/16 with myriad symptoms consistent with viral syndrome ongoing for 2 weeks despite outpatient Tx with doxycycline. Work up was largely reassuring with negative CXR, though she remained very symptomatic/dyspneic and weak on exertion with SpO2 down to 90%. SARS-CoV-2 test was positive . JSE:GBTD; DM II; HTN;   Precautions  Precaution Comments gets SOB, monitopr SPO2 on RA  Pain Assessment  Pain Assessment Faces  Faces Pain Scale 4  Pain Location neck  Pain Descriptors / Indicators Cramping;Discomfort  Pain Intervention(s) Limited activity within patient's tolerance;Other (comment) (Pt has heat pack on neck)  Cognition  Arousal/Alertness Awake/alert  Behavior During Therapy WFL for tasks assessed/performed  Overall Cognitive Status Within Functional Limits for tasks assessed  General Comments Pt expressed anxiety over DC concerns. Educated pt on currentl information about transmission of the virus and CDC guideline. pt staets she feels more relaxed adn "at ease" after our conversation. Pt educated on relaxation technqieus  Upper Extremity Assessment  Upper Extremity Assessment Overall WFL for tasks assessed  ADL  Overall ADL's  Needs assistance/impaired  Functional mobility during ADLs Supervision/safety   General ADL Comments Educated pt on useof rollator for ADL adn IADL tasks  Bed Mobility  Overal bed mobility Independent  Balance  Overall balance assessment Modified Independent  Exercises  Exercises Other exercises  Other Exercises  Other Exercises pt able to return demonstrate use of breathing exercises  OT - End of Session  Equipment Utilized During Treatment Other (comment) (rollator)  Activity Tolerance Patient tolerated treatment well  Patient left in chair;with call bell/phone within reach  Nurse Communication Mobility status (encourage ambualtion)  OT Assessment/Plan  OT Plan Discharge plan remains appropriate  OT Visit Diagnosis Unsteadiness on feet (R26.81);Muscle weakness (generalized) (M62.81);Pain  Pain - part of body  (neck)  OT Frequency (ACUTE ONLY) Min 3X/week  Follow Up Recommendations Home health OT;Supervision - Intermittent  OT Equipment None recommended by OT  AM-PAC OT "6 Clicks" Daily Activity Outcome Measure (Version 2)  Help from another person eating meals? 4  Help from another person taking care of personal grooming? 4  Help from another person toileting, which includes using toliet, bedpan, or urinal? 4  Help from another person bathing (including washing, rinsing, drying)? 4  Help from another person to put on and taking off regular upper body clothing? 4  Help from another person to put on and taking off regular lower body clothing? 4  6 Click Score 24  OT Goal Progression  Progress towards OT goals Progressing toward goals  Acute Rehab OT Goals  Patient Stated Goal to go home when she feels better  OT Goal Formulation With patient  Time For Goal Achievement 10/18/19  Potential to Achieve Goals Good  ADL Goals  Additional ADL Goal #1 Pt will indepedently verbalize 3 strategies for energy conservation  Additional ADL Goal #2  Pt will independently demosntrate 2 relaxation techqnieus to help manage anxiety associated with increased WOB during  ADL  Additional ADL Goal #3 Pt will independently demosntrate breathign exercises to increase activity tolerance for ADL  OT Time Calculation  OT Start Time (ACUTE ONLY) 0950  OT Stop Time (ACUTE ONLY) 1014  OT Time Calculation (min) 24 min  OT General Charges  $OT Visit 1 Visit  OT Treatments  $Self Care/Home Management  23-37 mins  Maurie Boettcher, OT/L   Acute OT Clinical Specialist [place] Pager [phone] Office [phone]

## 2019-10-05 NOTE — Progress Notes (Signed)
PROGRESS NOTE    Kelly Mooney  JOI:[id number] DOB: 08-16-1946 DOA: 10/02/2019 PCP: Lawerance Cruel, MD   Brief Narrative: Kelly Mooney is a 73 y.o. female who presented from home alone to ED 11/16 with myriad symptoms consistent with viral syndrome ongoing for 2 weeks despite outpatient Tx with doxycycline. Work up was largely reassuring with negative CXR, though she remained very symptomatic/dyspneic and weak on exertion with SpO2 down to 90%. SARS-CoV-2 test was positive and patient felt to be poor candidate for outpatient management, so she was admitted this morning by Dr. Shanon Brow. Steroids started.  Subjective: No acute issues or events overnight, patient continues to feel remarkably weak, fatigued even with minimal exertion.  Pain, nausea, vomiting, diarrhea, constipation, headache, fevers, chills.  Assessment & Plan:   Principal Problem:   COVID-19 virus infection Active Problems:   Type 2 diabetes mellitus without complication, with long-term current use of insulin (HCC)   Hypothyroidism   Essential hypertension   COPD (chronic obstructive pulmonary disease) (HCC)   AKI (acute kidney injury) (HCC)   GERD (gastroesophageal reflux disease)   Hypercholesteremia   Acute respiratory disease due to COVID-19 virus   Acute hypoxic respiratory failure due to covid-19:  -Without CXR infiltrate -Incidentally hypoxic, on 2L admission, now on room air -Patient continues to be remarkably weak and dyspneic with PT OT - Continue steroids.  - Disease course (>2 weeks of illness) is unlikely to be changed by antiviral/plasma - Continue airborne, contact precautions.  - Enoxaparin prophylactic dose.  - Maintain euvolemia/net negative.  - Avoid NSAIDs - Vitamin C, zinc - Recommend proning and aggressive use of incentive spirometry. - PT/OT ordered -continue to follow  AKI, POA, resolving:  - Improving with increased p.o. intake. Hold diuretic, ACE  COPD, unlikely an acute  exacerbation:  - Continue singulair, advair formulary equivalent, albuterol MDI  NIDT2DM:  - Hold home metformin and [facility]  - Continue SSI. Monitor for steroid-induced hyperglycemia.   Dysuria: - Reported in ED. Urine culture no growth.  - Continue to monitor.   Hypothyroidism:  -Continue synthroid  HTN:  - Restart home norvasc. Holding ACEi, thiazide due to AKI  Depression:  - Continue home escitalopram, topiramate  GERD:  - Continue PPI  HLD:  - Continue statin    DVT prophylaxis: Lovenox Code Status: Full Family Communication: Unavailable by phone Disposition Plan: Pending clinical status, likely discharge home in the next 24 to 48 hours with or without home health and physical therapy pending further evaluation   Objective: Vitals:   10/04/19 0408 10/04/19 0755 10/04/19 1955 10/05/19 0516  BP: (!) 132/58 (!) 135/46 137/62 127/84  Pulse:  78 78   Resp:  20 19   Temp: 97.9 F (36.6 C) 98.6 F (37 C) 99 F (37.2 C) 98.1 F (36.7 C)  TempSrc: Oral Oral Oral Oral  SpO2:  94% 94%   Weight:      Height:        Intake/Output Summary (Last 24 hours) at 10/05/2019 1155 Last data filed at 10/04/2019 2113 Gross per 24 hour  Intake 100 ml  Output -  Net 100 ml   Filed Weights   10/02/19 1337 10/03/19 0313  Weight: 69.9 kg 64.7 kg    Examination: General:  Pleasantly resting in bed, No acute distress. HEENT:  Normocephalic atraumatic.  Sclerae nonicteric, noninjected.  Extraocular movements intact bilaterally. Neck:  Without mass or deformity.  Trachea is midline. Lungs:  Clear to auscultate bilaterally without rhonchi,  wheeze, or rales. Heart:  Regular rate and rhythm.  Without murmurs, rubs, or gallops. Abdomen:  Soft, nontender, nondistended.  Without guarding or rebound. Extremities: Without cyanosis, clubbing, edema, or obvious deformity. Vascular:  Dorsalis pedis and posterior tibial pulses palpable bilaterally. Skin:  Warm and dry, no erythema, no  ulcerations.  Data Reviewed: I have personally reviewed following labs and imaging studies  CBC: Recent Labs  Lab 10/02/19 1439 10/03/19 0651 10/04/19 0020 10/05/19 0120  WBC 4.0 2.0* 6.0 6.0  NEUTROABS 3.1  --  4.6 4.5  HGB 13.2 12.4 11.7* 11.3*  HCT 39.9 37.1 35.9* 33.7*  MCV 87.5 87.7 87.8 87.5  PLT 184 176 220 627   Basic Metabolic Panel: Recent Labs  Lab 10/02/19 1439 10/03/19 0650 10/04/19 0020 10/05/19 0120  NA 139  --  143 140  K 3.2*  --  3.5 3.7  CL 104  --  105 104  CO2 22  --  26 25  GLUCOSE 120*  --  191* 197*  BUN 19  --  25* 28*  CREATININE 1.36* 1.10* 1.03* 1.05*  CALCIUM 8.5*  --  8.6* 8.4*   GFR: Estimated Creatinine Clearance: 41.5 mL/min (A) (by C-G formula based on SCr of 1.05 mg/dL (H)).   Liver Function Tests: Recent Labs  Lab 10/02/19 1439 10/04/19 0020 10/05/19 0120  AST 38 34 30  ALT 23 20 20   ALKPHOS 38 33* 33*  BILITOT 1.0 0.6 0.7  PROT 7.1 6.2* 5.8*  ALBUMIN 3.6 3.1* 2.9*   Lipid Profile: Recent Labs    10/02/19 1439  TRIG 168*   Anemia Panel: Recent Labs    10/02/19 1439  FERRITIN 144   Sepsis Labs: Recent Labs  Lab 10/02/19 1424 10/02/19 1439 10/02/19 1629  PROCALCITON  --  <0.10  --   LATICACIDVEN 1.8  --  1.7    Recent Results (from the past 240 hour(s))  Blood Culture (routine x 2)     Status: None (Preliminary result)   Collection Time: 10/02/19  2:50 PM   Specimen: BLOOD  Result Value Ref Range Status   Specimen Description   Final    BLOOD BLOOD RIGHT ARM Performed at [facility], [address] [place], [place] [address]    Special Requests   Final    BOTTLES DRAWN AEROBIC AND ANAEROBIC Blood Culture adequate volume Performed at [facility], [address] [place], [place] [address]    Culture   Final    NO GROWTH 3 DAYS Performed at [facility], [place] [address], [place], [place] [address]    Report Status PENDING  Incomplete  Blood Culture (routine x 2)      Status: None (Preliminary result)   Collection Time: 10/02/19  3:12 PM   Specimen: BLOOD  Result Value Ref Range Status   Specimen Description   Final    BLOOD BLOOD LEFT WRIST Performed at [facility], [address] [place], [place] [address]    Special Requests   Final    BOTTLES DRAWN AEROBIC AND ANAEROBIC Blood Culture adequate volume Performed at [facility], [address] [place], [place] [address]    Culture   Final    NO GROWTH 3 DAYS Performed at [facility], [place] [address], [place],  [address]    Report Status PENDING  Incomplete  Urine culture     Status: Abnormal   Collection Time:  10/02/19  3:12 PM   Specimen: Urine, Random  Result Value Ref Range Status   Specimen Description   Final    URINE, RANDOM Performed at [facility], [address] [place], [place] [address]    Special Requests   Final    NONE Performed at [facility], [address] [place], [place] [address]    Culture MULTIPLE SPECIES PRESENT, SUGGEST RECOLLECTION (A)  Final   Report Status 10/03/2019 FINAL  Final  SARS CORONAVIRUS 2 (TAT 6-24 HRS) Nasopharyngeal Nasopharyngeal Swab     Status: Abnormal   Collection Time: 10/02/19  3:12 PM   Specimen: Nasopharyngeal Swab  Result Value Ref Range Status   SARS Coronavirus 2 POSITIVE (A) NEGATIVE Final    Comment: RESULT CALLED TO, READ BACK BY AND VERIFIED WITH: L.ADKINES RN [phone] 10/02/2019 MCCORMICK K (NOTE) SARS-CoV-2 target nucleic acids are DETECTED. The SARS-CoV-2 RNA is generally detectable in upper and lower respiratory specimens during the acute phase of infection. Positive results are indicative of active infection with SARS-CoV-2. Clinical  correlation with patient history and other diagnostic information is necessary to determine patient infection status. Positive results do  not rule out bacterial infection or co-infection with other viruses. The expected result is  Negative. Fact Sheet for Patients: SugarRoll.be Fact Sheet for Healthcare Providers: https://www.woods-mathews.com/ This test is not yet approved or cleared by the [place] FDA and  has been authorized for detection and/or diagnosis of SARS-CoV-2 by FDA under an Emergency Use Authorization (EUA). This EUA will remain  in effect (meaning this test can be used)  for the duration of the COVID-19 declaration under Section 564(b)(1) of the Act, 21 U.S.C. section 360bbb-3(b)(1), unless the authorization is terminated or revoked sooner. Performed at [facility], [place] [address], [place], [place] [address]      Radiology Studies: No results found.  Scheduled Meds: . albuterol  2 puff Inhalation QID  . amLODipine  10 mg Oral Daily  . atorvastatin  20 mg Oral Q24H  . dexamethasone (DECADRON) injection  6 mg Intravenous Q24H  . enoxaparin (LOVENOX) injection  40 mg Subcutaneous Q24H  . escitalopram  5 mg Oral Daily  . fenofibrate  160 mg Oral Q24H  . levothyroxine  137 mcg Oral QAC breakfast  . mometasone-formoterol  2 puff Inhalation BID  . montelukast  10 mg Oral Daily  . pantoprazole  40 mg Oral Daily  . sodium chloride flush  3 mL Intravenous Q12H  . topiramate  50 mg Oral Daily  . vitamin C  500 mg Oral Daily  . zinc sulfate  220 mg Oral Daily   Continuous Infusions: . sodium chloride       LOS: 2 days   Time spent: 26mn  Jabar Krysiak C Aadith Raudenbush, DO [facility]  If 7PM-7AM, please contact night-coverage [email or web address] Password [email or web address] 10/05/2019, 11:55 AM

## 2019-10-05 NOTE — Progress Notes (Signed)
Physical Therapy Treatment Patient Details Name: Kelly Mooney MRN: [id number] DOB: 03/30/1946 Today's Date: 10/05/2019    History of Present Illness 73 y.o. female who presented from home alone to ED 11/16 with myriad symptoms consistent with viral syndrome ongoing for 2 weeks despite outpatient Tx with doxycycline. Work up was largely reassuring with negative CXR, though she remained very symptomatic/dyspneic and weak on exertion with SpO2 down to 90%. SARS-CoV-2 test was positive . MLY:YTKP; DM II; HTN;     PT Comments    The patient is progressing in mobility and tolerance to ambulation. Using /4 wheeled RW for safety as patient will be home alone. Patient's S{PO2 94% RA post ambulation. Patient expresses concern for getting her car [facility] parking and to get home and relying on family.   Follow Up Recommendations  Home health PT     Equipment Recommendations  (rollator/4 wheels)    Recommendations for Other Services       Precautions / Restrictions Precautions Precaution Comments: gets SOB, monitopr SPO2 on RA    Mobility  Bed Mobility Overal bed mobility: Independent                Transfers Overall transfer level: Independent                  Ambulation/Gait Ambulation/Gait assistance: Supervision Gait Distance (Feet): 400 Feet Assistive device: 4-wheeled walker Gait Pattern/deviations: Step-through pattern     General Gait Details: no stops to rest, coughing speel after return to room, up ad lib in room to [facility]   Stairs             Wheelchair Mobility    Modified Rankin (Stroke Patients Only)       Balance                                            Cognition                                       General Comments: expresses concern for discharge process with family, deferred to RN      Exercises General Exercises - Lower Extremity Long Arc Quad: AROM;Both;10 reps Hip ABduction/ADduction:  AROM;5 reps;Standing;Both Hip Flexion/Marching: Both;5 reps;AROM;Standing Toe Raises: Both;10 reps;AROM;Standing Mini-Sqauts: AROM;Standing;5 reps    General Comments        Pertinent Vitals/Pain Faces Pain Scale: Hurts little more Pain Location: neck Pain Descriptors / Indicators: Cramping;Discomfort Pain Intervention(s): Relaxation;Heat applied    Home Living                      Prior Function            PT Goals (current goals can now be found in the care plan section) Progress towards PT goals: Progressing toward goals    Frequency    Min 3X/week      PT Plan Current plan remains appropriate    Co-evaluation              AM-PAC PT "6 Clicks" Mobility   Outcome Measure  Help needed turning from your back to your side while in a flat bed without using bedrails?: None Help needed moving from lying on your back to sitting on the side of a flat bed without  using bedrails?: None Help needed moving to and from a bed to a chair (including a wheelchair)?: None Help needed standing up from a chair using your arms (e.g., wheelchair or bedside chair)?: None Help needed to walk in hospital room?: None Help needed climbing 3-5 steps with a railing? : A Little 6 Click Score: 23    End of Session   Activity Tolerance: Patient tolerated treatment well Patient left: (in BR) Nurse Communication: Mobility status PT Visit Diagnosis: Difficulty in walking, not elsewhere classified (R26.2)     Time: 1779-3903 PT Time Calculation (min) (ACUTE ONLY): 41 min  Charges:  $Gait Training: 23-37 mins $Self Care/Home Management: [place]  Office [phone]    Claretha Cooper 10/05/2019, 9:49 AM

## 2019-10-05 NOTE — Plan of Care (Signed)
  Problem: Education: Goal: Knowledge of risk factors and measures for prevention of condition will improve Outcome: Progressing   Problem: Coping: Goal: Psychosocial and spiritual needs will be supported Outcome: Progressing   Problem: Respiratory: Goal: Will maintain a patent airway Outcome: Progressing Goal: Complications related to the disease process, condition or treatment will be avoided or minimized Outcome: Progressing   

## 2019-10-05 NOTE — Progress Notes (Signed)
Son called and was given update. All questions and concerns addressed. No other needs or concerns at this. Will up date as needed.

## 2019-10-05 NOTE — Progress Notes (Signed)
Pt did exceptional well. Less coughing and pt slept well. Educate daughter on pt status.

## 2019-10-06 LAB — COMPREHENSIVE METABOLIC PANEL
ALT: 23 U/L (ref 0–44)
AST: 27 U/L (ref 15–41)
Albumin: 3.3 g/dL — ABNORMAL LOW (ref 3.5–5.0)
Alkaline Phosphatase: 33 U/L — ABNORMAL LOW (ref 38–126)
Anion gap: 13 (ref 5–15)
BUN: 30 mg/dL — ABNORMAL HIGH (ref 8–23)
CO2: 26 mmol/L (ref 22–32)
Calcium: 8.6 mg/dL — ABNORMAL LOW (ref 8.9–10.3)
Chloride: 102 mmol/L (ref 98–111)
Creatinine, Ser: 0.98 mg/dL (ref 0.44–1.00)
GFR calc Af Amer: >60 mL/min (ref >60)
GFR calc non Af Amer: 57 mL/min — ABNORMAL LOW (ref >60)
Glucose, Bld: 233 mg/dL — ABNORMAL HIGH (ref 70–99)
Potassium: 3.5 mmol/L (ref 3.5–5.1)
Sodium: 141 mmol/L (ref 135–145)
Total Bilirubin: 0.6 mg/dL (ref 0.3–1.2)
Total Protein: 6 g/dL — ABNORMAL LOW (ref 6.5–8.1)

## 2019-10-06 LAB — CBC WITH DIFFERENTIAL/PLATELET
Abs Immature Granulocytes: 0.08 10*3/uL — ABNORMAL HIGH (ref 0.00–0.07)
Basophils Absolute: 0 10*3/uL (ref 0.0–0.1)
Basophils Relative: 0 %
Eosinophils Absolute: 0 10*3/uL (ref 0.0–0.5)
Eosinophils Relative: 0 %
HCT: 33.5 % — ABNORMAL LOW (ref 36.0–46.0)
Hemoglobin: 11 g/dL — ABNORMAL LOW (ref 12.0–15.0)
Immature Granulocytes: 1 %
Lymphocytes Relative: 19 %
Lymphs Abs: 1.2 10*3/uL (ref 0.7–4.0)
MCH: 28.6 pg (ref 26.0–34.0)
MCHC: 32.8 g/dL (ref 30.0–36.0)
MCV: 87.2 fL (ref 80.0–100.0)
Monocytes Absolute: 0.4 10*3/uL (ref 0.1–1.0)
Monocytes Relative: 6 %
Neutro Abs: 4.7 10*3/uL (ref 1.7–7.7)
Neutrophils Relative %: 74 %
Platelets: 250 10*3/uL (ref 150–400)
RBC: 3.84 MIL/uL — ABNORMAL LOW (ref 3.87–5.11)
RDW: 12.9 % (ref 11.5–15.5)
WBC: 6.4 10*3/uL (ref 4.0–10.5)
nRBC: 0 % (ref 0.0–0.2)

## 2019-10-06 LAB — C-REACTIVE PROTEIN: CRP: <0.8 mg/dL (ref <1.0)

## 2019-10-06 LAB — D-DIMER, QUANTITATIVE: D-Dimer, Quant: 0.41 ug/mL-FEU (ref 0.00–0.50)

## 2019-10-06 MED ORDER — PREDNISONE 10 MG PO TABS: ORAL_TABLET | ORAL | 0 refills | Status: AC

## 2019-10-06 MED ORDER — GUAIFENESIN-DM 100-10 MG/5ML PO SYRP: 10.0000 mL | ORAL_SOLUTION | ORAL | 0 refills | Status: DC | PRN

## 2019-10-06 NOTE — Plan of Care (Signed)
  Problem: Respiratory: Goal: Will maintain a patent airway Outcome: Progressing Goal: Complications related to the disease process, condition or treatment will be avoided or minimized Outcome: Progressing   

## 2019-10-06 NOTE — Plan of Care (Addendum)
Educated pt on meds for discharge Problem: Education: Goal: Knowledge of risk factors and measures for prevention of condition will improve Outcome: Progressing   Problem: Coping: Goal: Psychosocial and spiritual needs will be supported Outcome: Progressing   Problem: Respiratory: Goal: Will maintain a patent airway Outcome: Progressing Goal: Complications related to the disease process, condition or treatment will be avoided or minimized Outcome: Progressing

## 2019-10-06 NOTE — Discharge Summary (Signed)
Physician Discharge Summary  Kelly Mooney WOE:[id number] DOB: 1946/08/03 DOA: 10/02/2019  PCP: Lawerance Cruel, MD  Admit date: 10/02/2019 Discharge date: 10/06/2019  Admitted From: Home Disposition: Home  Recommendations for Outpatient Follow-up:  1. Follow up with PCP in 1-2 weeks 2. Please obtain BMP/CBC in one week  Home Health: Yes Equipment/Devices: Per PT  Discharge Condition: Stable CODE STATUS: Full Diet recommendation: As tolerated  Brief/Interim Summary: Kelly Mooney is a 73 y.o. female who presented from home alone to ED 11/16 with myriad symptoms consistent with viral syndrome ongoing for 2 weeks despite outpatient Tx with doxycycline. Work up was largely reassuring with negative CXR, though she remained very symptomatic/dyspneic and weak on exertion with SpO2 down to 90%. SARS-CoV-2 test was positive and patient felt to be poor candidate for outpatient management, so she was admitted this morning by Dr. Shanon Brow. Steroids started  Patient admitted as above with acute symptomatic COVID-19 pneumonia, marked fatigue, weakness, poor p.o. intake and ambulatory dysfunction.  Treated with steroids alone given patient was transiently hypoxic at admission with exertion but now able to ambulate without overt hypoxia or symptoms.  Fatigue, weakness, ambulatory dysfunction ongoing, moderately improved from admission but not yet resolved.  PT OT evaluated patient previously recommending home health with PT.  Patient otherwise stable and agreeable for discharge home, will need close follow-up with PCP in the next 1 to 2 weeks for resolution of symptoms.  Patient was discharged with steroids and cough suppressant.  Discharge Diagnoses:  Principal Problem:   COVID-19 virus infection Active Problems:   Type 2 diabetes mellitus without complication, with long-term current use of insulin (HCC)   Hypothyroidism   Essential hypertension   COPD (chronic obstructive pulmonary disease)  (HCC)   AKI (acute kidney injury) (HCC)   GERD (gastroesophageal reflux disease)   Hypercholesteremia   Acute respiratory disease due to COVID-19 virus    Discharge Instructions  Discharge Instructions    Call MD for:  difficulty breathing, headache or visual disturbances   Complete by: As directed    Call MD for:  extreme fatigue   Complete by: As directed    Call MD for:  persistant dizziness or light-headedness   Complete by: As directed    Call MD for:  persistant nausea and vomiting   Complete by: As directed    Call MD for:  severe uncontrolled pain   Complete by: As directed    Call MD for:  temperature >100.4   Complete by: As directed    Diet - low sodium heart healthy   Complete by: As directed    Increase activity slowly   Complete by: As directed      Allergies as of 10/06/2019      Reactions   Chloramphenicol Anaphylaxis   Ivp Dye [iodinated Diagnostic Agents] Anaphylaxis, Shortness Of Breath, Other (See Comments)   SEVERE convulsions, too   Crestor [rosuvastatin Calcium]    Muscle cramps   Iodine Itching   Sulfa Antibiotics Rash      Medication List    STOP taking these medications   aspirin 81 MG EC tablet     TAKE these medications   Advair HFA 115-21 MCG/ACT inhaler Generic drug: fluticasone-salmeterol Inhale 1 puff into the lungs 2 (two) times daily.   albuterol 108 (90 Base) MCG/ACT inhaler Commonly known as: VENTOLIN HFA Inhale 2 puffs into the lungs 4 (four) times daily.   amLODipine 10 MG tablet Commonly known as: NORVASC Take 10 mg by  mouth daily.   atorvastatin 20 MG tablet Commonly known as: Lipitor Take 1 tablet (20 mg total) by mouth daily.   chlorpheniramine-HYDROcodone 10-8 MG/5ML Suer Commonly known as: TUSSIONEX Take 5 mLs by mouth every 12 (twelve) hours as needed for cough.   chlorthalidone 25 MG tablet Commonly known as: HYGROTON Take 12.5-25 mg by mouth every morning.   clobetasol cream 0.05 % Commonly known  as: TEMOVATE Apply 1 application topically See admin instructions. Apply a thin layer to affected areas 2 times a day as needed for irritation   escitalopram 5 MG tablet Commonly known as: LEXAPRO Take 5 mg by mouth daily.   fenofibrate 160 MG tablet Take 160 mg by mouth daily.   fluocinonide 0.05 % external solution Commonly known as: LIDEX Apply 1 application topically See admin instructions. Apply to affected areas daily as needed for itching   glimepiride 4 MG tablet Commonly known as: AMARYL Take 4 mg by mouth 2 (two) times daily.   guaiFENesin-dextromethorphan 100-10 MG/5ML syrup Commonly known as: ROBITUSSIN DM Take 10 mLs by mouth every 4 (four) hours as needed for cough.   hydroxypropyl methylcellulose / hypromellose 2.5 % ophthalmic solution Commonly known as: ISOPTO TEARS / GONIOVISC Place 1 drop into both eyes as needed for dry eyes.   levothyroxine 137 MCG tablet Commonly known as: SYNTHROID Take 137 mcg by mouth daily before breakfast.   metFORMIN 500 MG 24 hr tablet Commonly known as: GLUCOPHAGE-XR Take 1,000 mg by mouth 2 (two) times daily.   montelukast 10 MG tablet Commonly known as: SINGULAIR Take 10 mg by mouth daily.   pantoprazole 40 MG tablet Commonly known as: PROTONIX TAKE 1 TABLET (40 MG TOTAL) BY MOUTH DAILY.   predniSONE 10 MG tablet Commonly known as: DELTASONE Take 4 tablets (40 mg total) by mouth daily for 3 days, THEN 3 tablets (30 mg total) daily for 3 days, THEN 2 tablets (20 mg total) daily for 3 days, THEN 1 tablet (10 mg total) daily for 3 days. Start taking on: October 06, 2019   quinapril 40 MG tablet Commonly known as: ACCUPRIL Take 40 mg by mouth daily.   topiramate 50 MG tablet Commonly known as: Topamax Take 1 tablet (50 mg total) by mouth daily.       Allergies  Allergen Reactions  . Chloramphenicol Anaphylaxis  . Ivp Dye [Iodinated Diagnostic Agents] Anaphylaxis, Shortness Of Breath and Other (See Comments)     SEVERE convulsions, too   . Crestor [Rosuvastatin Calcium]     Muscle cramps  . Iodine Itching  . Sulfa Antibiotics Rash    Procedures/Studies: Dg Chest Port 1 View  Result Date: 10/02/2019 CLINICAL DATA:  Cough for 2 weeks, nausea and back pain, unknown COVID-19 results. EXAM: PORTABLE CHEST 1 VIEW COMPARISON:  Radiograph April 25, 2015 FINDINGS: Persistent lung hyperinflation and coarsened interstitial changes and airways thickening compatible with history of COPD and asthma. No acute consolidative process. No pneumothorax or effusion. Increased attenuation in the left lung base contiguous with the left heart border likely reflects prominent pericardial fat pad. The cardiomediastinal contours are unremarkable. No acute osseous or soft tissue abnormality IMPRESSION: 1. No active cardiopulmonary disease. 2. Unchanged findings of COPD and asthma. 3. Increased attenuation in the left lung base contiguous with the left heart border likely reflects prominent pericardial fat pad. Electronically Signed   By: Lovena Le M.D.   On: 10/02/2019 14:44    Subjective: No acute issues or events overnight, denies chest pain, shortness  of air, nausea, vomiting, diarrhea, constipation, headache, fevers, chills.   Discharge Exam: Vitals:   10/06/19 0359 10/06/19 0825  BP: (!) 142/51 134/65  Pulse:  98  Resp:  18  Temp: 97.6 F (36.4 C) 98 F (36.7 C)  SpO2:  93%   Vitals:   10/05/19 0516 10/05/19 1953 10/06/19 0359 10/06/19 0825  BP: 127/84 (!) 145/55 (!) 142/51 134/65  Pulse:    98  Resp:    18  Temp: 98.1 F (36.7 C) 98.2 F (36.8 C) 97.6 F (36.4 C) 98 F (36.7 C)  TempSrc: Oral Oral Oral Oral  SpO2:  92%  93%  Weight:      Height:        General:  Pleasantly resting in bed, No acute distress. HEENT:  Normocephalic atraumatic.  Sclerae nonicteric, noninjected.  Extraocular movements intact bilaterally. Neck:  Without mass or deformity.  Trachea is midline. Lungs:  Clear to auscultate  bilaterally without rhonchi, wheeze, or rales. Heart:  Regular rate and rhythm.  Without murmurs, rubs, or gallops. Abdomen:  Soft, nontender, nondistended.  Without guarding or rebound. Extremities: Without cyanosis, clubbing, edema, or obvious deformity. Vascular:  Dorsalis pedis and posterior tibial pulses palpable bilaterally. Skin:  Warm and dry, no erythema, no ulcerations.   The results of significant diagnostics from this hospitalization (including imaging, microbiology, ancillary and laboratory) are listed below for reference.     Microbiology: Recent Results (from the past 240 hour(s))  Blood Culture (routine x 2)     Status: None (Preliminary result)   Collection Time: 10/02/19  2:50 PM   Specimen: BLOOD  Result Value Ref Range Status   Specimen Description   Final    BLOOD BLOOD RIGHT ARM Performed at [facility], [address] [place], [place] [address]    Special Requests   Final    BOTTLES DRAWN AEROBIC AND ANAEROBIC Blood Culture adequate volume Performed at [facility], [address] [place], [place] [address]    Culture   Final    NO GROWTH 4 DAYS Performed at [facility], [place] [address] [place], [place] [address]    Report Status PENDING  Incomplete  Blood Culture (routine x 2)     Status: None (Preliminary result)   Collection Time: 10/02/19  3:12 PM   Specimen: BLOOD  Result Value Ref Range Status   Specimen Description   Final    BLOOD BLOOD LEFT WRIST Performed at [facility], [address] [place], [place] [address]    Special Requests   Final    BOTTLES DRAWN AEROBIC AND ANAEROBIC Blood Culture adequate volume Performed at [facility], [address] [place], [place] [address]    Culture   Final    NO GROWTH 4 DAYS Performed at [facility], [place] [address] [place], [place] [address]    Report Status PENDING  Incomplete  Urine culture     Status: Abnormal   Collection Time:  10/02/19  3:12 PM   Specimen: Urine, Random  Result Value Ref Range Status   Specimen Description   Final    URINE, RANDOM Performed at [facility], [address] [place], [place] [address]    Special Requests   Final    NONE Performed at [facility], [address] [place], [place] [address]    Culture MULTIPLE SPECIES PRESENT, SUGGEST RECOLLECTION (A)  Final   Report Status 10/03/2019 FINAL  Final  SARS CORONAVIRUS 2 (TAT 6-24 HRS) Nasopharyngeal Nasopharyngeal Swab     Status: Abnormal   Collection Time: 10/02/19  3:12 PM   Specimen: Nasopharyngeal Swab  Result Value Ref Range Status   SARS Coronavirus 2 POSITIVE (A) NEGATIVE Final    Comment: RESULT CALLED TO, READ BACK BY AND VERIFIED WITH: L.ADKINES RN [phone] 10/02/2019 MCCORMICK K (NOTE) SARS-CoV-2 target nucleic acids are DETECTED. The SARS-CoV-2 RNA is generally detectable in upper and lower respiratory specimens during the acute phase of infection. Positive results are indicative of active infection with SARS-CoV-2. Clinical  correlation with patient history and other diagnostic information is necessary to determine patient infection status. Positive results do  not rule out bacterial infection or co-infection with other viruses. The expected result is Negative. Fact Sheet for Patients: SugarRoll.be Fact Sheet for Healthcare Providers: https://www.woods-mathews.com/ This test is not yet approved or cleared by the [place] FDA and  has been authorized for detection and/or diagnosis of SARS-CoV-2 by FDA under an Emergency Use Authorization (EUA). This EUA will remain  in effect (meaning this test can be used)  for the duration of the COVID-19 declaration under Section 564(b)(1) of the Act, 21 U.S.C. section 360bbb-3(b)(1), unless the authorization is terminated or revoked sooner. Performed at [facility], [place] [address] [place],  [place] [address]      Labs: BNP (last 3 results) No results for input(s): BNP in the last 8760 hours. Basic Metabolic Panel: Recent Labs  Lab 10/02/19 1439 10/03/19 0650 10/04/19 0020 10/05/19 0120 10/06/19 0150  NA 139  --  143 140 141  K 3.2*  --  3.5 3.7 3.5  CL 104  --  105 104 102  CO2 22  --  26 25 26   GLUCOSE 120*  --  191* 197* 233*  BUN 19  --  25* 28* 30*  CREATININE 1.36* 1.10* 1.03* 1.05* 0.98  CALCIUM 8.5*  --  8.6* 8.4* 8.6*   Liver Function Tests: Recent Labs  Lab 10/02/19 1439 10/04/19 0020 10/05/19 0120 10/06/19 0150  AST 38 34 30 27  ALT 23 20 20 23   ALKPHOS 38 33* 33* 33*  BILITOT 1.0 0.6 0.7 0.6  PROT 7.1 6.2* 5.8* 6.0*  ALBUMIN 3.6 3.1* 2.9* 3.3*   No results for input(s): LIPASE, AMYLASE in the last 168 hours. No results for input(s): AMMONIA in the last 168 hours. CBC: Recent Labs  Lab 10/02/19 1439 10/03/19 0651 10/04/19 0020 10/05/19 0120 10/06/19 0150  WBC 4.0 2.0* 6.0 6.0 6.4  NEUTROABS 3.1  --  4.6 4.5 4.7  HGB 13.2 12.4 11.7* 11.3* 11.0*  HCT 39.9 37.1 35.9* 33.7* 33.5*  MCV 87.5 87.7 87.8 87.5 87.2  PLT 184 176 220 228 250   Cardiac Enzymes: No results for input(s): CKTOTAL, CKMB, CKMBINDEX, TROPONINI in the last 168 hours. BNP: Invalid input(s): POCBNP CBG: No results for input(s): GLUCAP in the last 168 hours. D-Dimer Recent Labs    10/05/19 0120 10/06/19 0150  DDIMER 0.38 0.41   Hgb A1c No results for input(s): HGBA1C in the last 72 hours. Lipid Profile No results for input(s): CHOL, HDL, LDLCALC, TRIG, CHOLHDL, LDLDIRECT in the last 72 hours. Thyroid function studies No results for input(s): TSH, T4TOTAL, T3FREE, THYROIDAB in the last 72 hours.  Invalid input(s): FREET3 Anemia work up No results for input(s): VITAMINB12, FOLATE, FERRITIN, TIBC, IRON, RETICCTPCT in the last 72 hours. Urinalysis    Component  Value Date/Time   COLORURINE YELLOW 10/02/2019 1512   APPEARANCEUR CLEAR 10/02/2019 1512   LABSPEC  >1.030 (H) 10/02/2019 1512   PHURINE 5.5 10/02/2019 1512   GLUCOSEU NEGATIVE 10/02/2019 1512   HGBUR NEGATIVE 10/02/2019 1512   BILIRUBINUR SMALL (A) 10/02/2019 1512   KETONESUR 15 (A) 10/02/2019 1512   PROTEINUR 30 (A) 10/02/2019 1512   NITRITE NEGATIVE 10/02/2019 1512   LEUKOCYTESUR NEGATIVE 10/02/2019 1512   Sepsis Labs Invalid input(s): PROCALCITONIN,  WBC,  LACTICIDVEN Microbiology Recent Results (from the past 240 hour(s))  Blood Culture (routine x 2)     Status: None (Preliminary result)   Collection Time: 10/02/19  2:50 PM   Specimen: BLOOD  Result Value Ref Range Status   Specimen Description   Final    BLOOD BLOOD RIGHT ARM Performed at [facility], [address] [place], [place] [address]    Special Requests   Final    BOTTLES DRAWN AEROBIC AND ANAEROBIC Blood Culture adequate volume Performed at [facility], [address] [place], [place] [address]    Culture   Final    NO GROWTH 4 DAYS Performed at [facility], [place] [address] [place], [place] [address]    Report Status PENDING  Incomplete  Blood Culture (routine x 2)     Status: None (Preliminary result)   Collection Time: 10/02/19  3:12 PM   Specimen: BLOOD  Result Value Ref Range Status   Specimen Description   Final    BLOOD BLOOD LEFT WRIST Performed at [facility], [address] [place], [place] [address]    Special Requests   Final    BOTTLES DRAWN AEROBIC AND ANAEROBIC Blood Culture adequate volume Performed at [facility], [address] [place], [place] [address]    Culture   Final    NO GROWTH 4 DAYS Performed at [facility], [place] [address], [place], [place] [address]    Report Status PENDING  Incomplete  Urine culture     Status: Abnormal   Collection Time: 10/02/19  3:12 PM   Specimen: Urine, Random  Result Value Ref Range Status   Specimen Description   Final    URINE, RANDOM Performed at [facility], [address] [place], [place] [address]    Special Requests   Final    NONE Performed at [facility], [address] [place], [place] [address]    Culture MULTIPLE SPECIES PRESENT, SUGGEST RECOLLECTION (A)  Final   Report Status 10/03/2019 FINAL  Final  SARS CORONAVIRUS 2 (TAT 6-24 HRS) Nasopharyngeal Nasopharyngeal Swab     Status: Abnormal   Collection Time: 10/02/19  3:12 PM   Specimen: Nasopharyngeal Swab  Result Value Ref Range Status   SARS Coronavirus 2 POSITIVE (A) NEGATIVE Final    Comment: RESULT CALLED TO, READ BACK BY AND VERIFIED WITH: L.ADKINES RN [phone] 10/02/2019 MCCORMICK K (NOTE) SARS-CoV-2 target nucleic acids are DETECTED. The SARS-CoV-2 RNA is generally detectable in upper and lower respiratory specimens during the acute phase of infection. Positive results are indicative of active infection with SARS-CoV-2. Clinical  correlation with patient history and other diagnostic information is necessary to determine patient infection status. Positive results do  not rule out bacterial infection or co-infection with other viruses. The expected result is Negative. Fact Sheet for Patients: SugarRoll.be Fact Sheet for Healthcare Providers: https://www.woods-mathews.com/ This test is not yet  approved or cleared by the [place] and  has been authorized for detection and/or diagnosis of SARS-CoV-2 by FDA under an Emergency Use Authorization (EUA). This EUA will remain  in effect (meaning this test can be used)  for the duration of the COVID-19 declaration under Section 564(b)(1) of the Act, 21 U.S.C. section 360bbb-3(b)(1), unless the authorization is terminated or revoked sooner. Performed at [facility], [place] [address], [place], [place] [address]      Time coordinating discharge: Over 30 minutes  SIGNED:   Little Ishikawa, DO [facility] 10/06/2019, 11:09 AM Pager   If 7PM-7AM,  please contact night-coverage [email or web address] Password [email or web address]

## 2019-10-07 LAB — CULTURE, BLOOD (ROUTINE X 2)
Culture: NO GROWTH
Culture: NO GROWTH
Special Requests: ADEQUATE
Special Requests: ADEQUATE

## 2019-10-17 ENCOUNTER — Ambulatory Visit: Payer: Medicare Other | Admitting: Cardiology

## 2020-03-11 ENCOUNTER — Ambulatory Visit: Payer: Self-pay | Admitting: Surgery

## 2020-03-11 NOTE — H&P (Signed)
Kelly Mooney Appointment: 03/11/2020 2:30 PM Location: [facility] Patient #: [id number] DOB: 09/05/1946 Widowed / Language: Kelly Mooney / Race: White Female  History of Present Illness Kelly Hector MD; 03/11/2020 3:25 PM) The patient is a 74 year old female who presents with anal pain. Note for "Anal pain": ` ` ` Patient sent for surgical consultation at the request of  Chief Complaint: Anal pain. Probable hemorrhoids. ` ` The patient is a woman that has struggled with anorectal issues. Her bowels 7 times a day. Uses antidiarrheals almost on a daily basis. History of colon polyps in the past. Intermittently seen by lumbar gastroenterology as well as [facility] gastroenterology. Last endoscopy Dr. Oletta Mooney in 2014. Most recently seen by Dr. Henrene Mooney 2017 overdue for f/u coloscopy 2020- held off a COVID. She was hospitalized with Covid pneumonitis herself was discharged in late November 2020. She had some TIAs and was followed by neurology. Was in coagulate and Plavix but no longer on that. 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 She has required some occasional dilatations by Kelly Mooney gastroneurology. She feels like her dysphagias come back.  She had many stroke and then Covid which is isolated her routine follow-up for the past few years. She is trying to reestablish care. She denies any rectal bleeding or melena. She takes Imodium a few times a week. No fiber supplement. Notes described the use of a proton pump inhibitor and bile absorbers such as Questran. She does not recall being on those and certainly is not on it right now. No personal nor family history of GI/colon cancer, inflammatory bowel disease, allergy such as Celiac Sprue, dietary/dairy problems, colitis, ulcers nor gastritis. No recent sick contacts/gastroenteritis. No travel outside the country. No changes in diet. No hematochezia, hematemesis, coffee ground emesis. No  evidence of prior gastric/peptic ulceration.  (Review of systems as stated in this history (HPI) or in the review of systems. Otherwise all other 12 point ROS are negative) ` ` `  This patient encounter took 40 minutes today to perform the following: obtain history, perform exam, review outside records, interpret tests & imaging, counsel the patient on their diagnosis; and, document this encounter, including findings & plan in the electronic health record (EHR).   Past Surgical History Kelly Mooney, [place]; 03/11/2020 2:33 PM) Cataract Surgery Bilateral. Colon Polyp Removal - Colonoscopy Foot Surgery Right. Gallbladder Surgery - Open Hysterectomy (not due to cancer) - Partial  Diagnostic Studies History Kelly Mooney, [place]; 03/11/2020 2:33 PM) Colonoscopy 5-10 years ago Mammogram 1-3 years ago Pap Smear 1-5 years ago  Allergies Kelly Mooney, CMA; 03/11/2020 2:35 PM) Iodine (Antiseptic) *ANTISEPTICS & DISINFECTANTS* Iodinated Contrast Media Allergies Reconciled  Medication History Kelly Mooney, CMA; 03/11/2020 2:37 PM) Fluocinonide (0.05% Cream, External) Active. Glimepiride (4MG  Tablet, Oral) Active. Levothyroxine Sodium (137MCG Tablet, Oral) Active. amLODIPine Besylate (10MG  Tablet, Oral) Active. Atorvastatin Calcium (20MG  Tablet, Oral) Active. Chlorthalidone (25MG  Tablet, Oral) Active. Clobetasol Propionate (0.05% Cream, External) Active. Escitalopram Oxalate (5MG  Tablet, Oral) Active. Fenofibrate (160MG  Tablet, Oral) Active. Fluticasone-Salmeterol (115-21MCG/ACT Aerosol, Inhalation) Active. metFORMIN HCl ER (500MG  Tablet ER 24HR, Oral) Active. Quinapril HCl (40MG  Tablet, Oral) Active. Pantoprazole Sodium (40MG  Tablet DR, Oral) Active. valACYclovir HCl (1GM Tablet, Oral) Active. Medications Reconciled  Social History Kelly Mooney, [place]; 03/11/2020 2:33 PM) Alcohol use Occasional alcohol use. Caffeine use Carbonated beverages,  Coffee, Tea. No drug use Tobacco use Former smoker.  Family History Kelly Mooney, [place]; 03/11/2020 2:33 PM) Anesthetic complications Brother, Mother, Sister. Diabetes Mellitus Brother. Heart  Disease Brother. Heart disease in female family member before age 44 Hypertension Brother. Kidney Disease Brother. Prostate Cancer Brother.  Pregnancy / Birth History Kelly Mooney, [place]; 03/11/2020 2:33 PM) Age at menarche 44 years. Age of menopause 16-50 Contraceptive History Intrauterine device. Gravida 2 Maternal age 71-20 Para 2 Regular periods  Other Problems Kelly Mooney, [place]; 03/11/2020 2:33 PM) Arthritis Asthma Back Pain Bladder Problems Cerebrovascular Accident Diabetes Mellitus Gastroesophageal Reflux Disease General anesthesia - complications Hemorrhoids High blood pressure Hypercholesterolemia Other disease, cancer, significant illness Thyroid Disease     Review of Systems Kelly Mooney CMA; 03/11/2020 2:33 PM) General Present- Fatigue. Not Present- Appetite Loss, Chills, Fever, Night Sweats, Weight Gain and Weight Loss. Skin Not Present- Change in Wart/Mole, Dryness, Hives, Jaundice, New Lesions, Non-Healing Wounds, Rash and Ulcer. HEENT Present- Seasonal Allergies. Not Present- Earache, Hearing Loss, Hoarseness, Nose Bleed, Oral Ulcers, Ringing in the Ears, Sinus Pain, Sore Throat, Visual Disturbances, Wears glasses/contact lenses and Yellow Eyes. Respiratory Present- Difficulty Breathing. Not Present- Bloody sputum, Chronic Cough, Snoring and Wheezing. Breast Not Present- Breast Mass, Breast Pain, Nipple Discharge and Skin Changes. Cardiovascular Present- Shortness of Breath. Not Present- Chest Pain, Difficulty Breathing Lying Down, Leg Cramps, Palpitations, Rapid Heart Rate and Swelling of Extremities. Gastrointestinal Present- Bloating, Chronic diarrhea, Difficulty Swallowing, Gets full quickly at meals, Hemorrhoids, Indigestion  and Rectal Pain. Not Present- Abdominal Pain, Bloody Stool, Change in Bowel Habits, Constipation, Excessive gas, Nausea and Vomiting. Female Genitourinary Not Present- Frequency, Nocturia, Painful Urination, Pelvic Pain and Urgency. Musculoskeletal Present- Back Pain and Joint Stiffness. Not Present- Joint Pain, Muscle Pain, Muscle Weakness and Swelling of Extremities. Neurological Not Present- Decreased Memory, Fainting, Headaches, Numbness, Seizures, Tingling, Tremor, Trouble walking and Weakness. Psychiatric Not Present- Anxiety, Bipolar, Change in Sleep Pattern, Depression, Fearful and Frequent crying. Endocrine Not Present- Cold Intolerance, Excessive Hunger, Hair Changes, Heat Intolerance, Hot flashes and New Diabetes. Hematology Present- Easy Bruising. Not Present- Blood Thinners, Excessive bleeding, Gland problems, HIV and Persistent Infections.  Vitals Kelly Mooney CMA; 03/11/2020 2:34 PM) 03/11/2020 2:33 PM Weight: 155.6 lb Height: 61in Body Surface Area: 1.7 m Body Mass Index: 29.4 kg/m  Temp.: 98.13F  Pulse: 89 (Regular)  BP: 134/68(Sitting, Left Arm, Standard)        Physical Exam Kelly Hector MD; 03/11/2020 3:23 PM)  General Mental Status-Alert. General Appearance-Not in acute distress, Not Sickly. Orientation-Oriented X3. Hydration-Well hydrated. Voice-Normal.  Integumentary Global Assessment Upon inspection and palpation of skin surfaces of the - Axillae: non-tender, no inflammation or ulceration, no drainage. and Distribution of scalp and body hair is normal. General Characteristics Temperature - normal warmth is noted.  Head and Neck Head-normocephalic, atraumatic with no lesions or palpable masses. Face Global Assessment - atraumatic, no absence of expression. Neck Global Assessment - no abnormal movements, no bruit auscultated on the right, no bruit auscultated on the left, no decreased range of motion,  non-tender. Trachea-midline. Thyroid Gland Characteristics - non-tender.  Eye Eyeball - Left-Extraocular movements intact, No Nystagmus - Left. Eyeball - Right-Extraocular movements intact, No Nystagmus - Right. Cornea - Left-No Hazy - Left. Cornea - Right-No Hazy - Right. Sclera/Conjunctiva - Left-No scleral icterus, No Discharge - Left. Sclera/Conjunctiva - Right-No scleral icterus, No Discharge - Right. Pupil - Left-Direct reaction to light normal. Pupil - Right-Direct reaction to light normal.  ENMT Ears Pinna - Left - no drainage observed, no generalized tenderness observed. Pinna - Right - no drainage observed, no generalized tenderness observed. Nose and Sinuses External Inspection of the Nose -  no destructive lesion observed. Inspection of the nares - Left - quiet respiration. Inspection of the nares - Right - quiet respiration. Mouth and Throat Lips - Upper Lip - no fissures observed, no pallor noted. Lower Lip - no fissures observed, no pallor noted. Nasopharynx - no discharge present. Oral Cavity/Oropharynx - Tongue - no dryness observed. Oral Mucosa - no cyanosis observed. Hypopharynx - no evidence of airway distress observed.  Chest and Lung Exam Inspection Movements - Normal and Symmetrical. Accessory muscles - No use of accessory muscles in breathing. Palpation Palpation of the chest reveals - Non-tender. Auscultation Breath sounds - Normal and Clear.  Cardiovascular Auscultation Rhythm - Regular. Murmurs & Other Heart Sounds - Auscultation of the heart reveals - No Murmurs and No Systolic Clicks.  Abdomen Inspection Inspection of the abdomen reveals - No Visible peristalsis and No Abnormal pulsations. Umbilicus - No Bleeding, No Urine drainage. Palpation/Percussion Palpation and Percussion of the abdomen reveal - Soft, Non Tender, No Rebound tenderness, No Rigidity (guarding) and No Cutaneous hyperesthesia. Note: Abdomen soft. Not severely  distended. No distasis recti. No umbilical or other anterior abdominal wall hernias  Female Genitourinary Sexual Maturity Tanner 5 - Adult hair pattern. Note: No vaginal bleeding nor discharge  Rectal Note: Obvious left lateral partially prolapsed grade 4 hemorrhoid. Sensitive. No thrombosis or ischemia. Some perianal moisture. No major pruritus.  Perianal skin clean with good hygiene. No pilonidal disease. No fissure. No abscess/fistula. Normal sphincter tone. No condyloma warts.  Rather sensitive but tolerates digital and anoscopic rectal exam. Large right posterior grade 3 hemorrhoid. Right anterior at least grade 2, possibly grade 3. Somewhat redundant floppy rectum but no true circumferential proccidentia prolapse. Exam done with assistance of female PA in the room.  Peripheral Vascular Upper Extremity Inspection - Left - No Cyanotic nailbeds - Left, Not Ischemic. Inspection - Right - No Cyanotic nailbeds - Right, Not Ischemic.  Neurologic Neurologic evaluation reveals -normal attention span and ability to concentrate, able to name objects and repeat phrases. Appropriate fund of knowledge , normal sensation and normal coordination. Mental Status Affect - not angry, not paranoid. Cranial Nerves-Normal Bilaterally. Gait-Normal.  Neuropsychiatric Mental status exam performed with findings of-able to articulate well with normal speech/language, rate, volume and coherence, thought content normal with ability to perform basic computations and apply abstract reasoning and no evidence of hallucinations, delusions, obsessions or homicidal/suicidal ideation.  Musculoskeletal Global Assessment Spine, Ribs and Pelvis - no instability, subluxation or laxity. Right Upper Extremity - no instability, subluxation or laxity.  Lymphatic Head & Neck  General Head & Neck Lymphatics: Bilateral - Description - No Localized lymphadenopathy. Axillary  General Axillary  Region: Bilateral - Description - No Localized lymphadenopathy. Femoral & Inguinal  Generalized Femoral & Inguinal Lymphatics: Left - Description - No Localized lymphadenopathy. Right - Description - No Localized lymphadenopathy.   Results Kelly Hector MD; 03/11/2020 3:24 PM) Procedures  Name Value Date ANOSCOPY, DIAGNOSTIC [phone]) [ Hemorrhoids ] Procedure Anal exam: prolapse Internal exam: Internal Hemorroids ( non-bleeding) prolapse Other: Obvious left lateral partially prolapsed grade 4 hemorrhoid. Sensitive. No thrombosis or ischemia. Some perianal moisture. No major pruritus............Marland KitchenPerianal skin clean with good hygiene. No pilonidal disease. No fissure. No abscess/fistula. Normal sphincter tone. No condyloma warts............Marland KitchenRather sensitive but tolerates digital and anoscopic rectal exam. Large right posterior grade 3 hemorrhoid. Right anterior at least grade 2, possibly grade 3. Somewhat redundant floppy rectum but no true circumferential proccidentia prolapse. Exam done with assistance of female PA in the room.  Performed: 03/11/2020 3:24 PM    Assessment & Plan Kelly Hector MD; 03/11/2020 3:23 PM)  PROLAPSED INTERNAL HEMORRHOIDS, GRADE 4 (K64.3) Impression: Worsening hemorrhoids in the setting of irritable bowel or diarrhea. Left lateral chronically prolapsed. Right posterior at least grade 3. I think the anatomy is too stretched out for banding alone to work. 2 sensitive anyway.  Think she would benefit from hemorrhoidal ligation/pexy. Hemorrhoidectomy of at least left lateral right posterior piles. Outpatient surgery. She had Covid last fall and is better now. We will work to coronary convenient time.  I noted that her hemorrhoids or a side effect of her diarrhea. She needs to slow down her bowels more aggressively. I recommend she do the loperamide at least twice a day. Gradually increase until she's going less than 3 times a day.  At a low-dose fiber supplement such as flaxseed her MiraLAX to thicken up her stools little bit. Thicken him up and slow him down. I strongly recommend she follow up with gastroenterology to see if there is further workup or treatment options for her chronic diarrhea.  Patient notes she feels like she is having worsening dysphagia with intermittent vomiting. Known esophageal stricture with dilatations done in the past. She thinks she is overdue for it.   PROLAPSED INTERNAL HEMORRHOIDS, GRADE 3 (K64.2) Impression: No personal nor family history of GI/colon cancer, inflammatory bowel disease, irritable bowel syndrome, allergy such as Celiac Sprue, dietary/dairy problems, colitis, ulcers nor gastritis. No recent sick contacts/gastroenteritis. No travel outside the country. No changes in diet. No dysphagia to solids or liquids. No significant heartburn or reflux. No hematochezia, hematemesis, coffee ground emesis. No evidence of prior gastric/peptic ulceration.  Current Plans ANOSCOPY, DIAGNOSTIC [id number]) You are being scheduled for surgery- Our schedulers will call you.  You should hear from our office's scheduling department within 5 working days about the location, date, and time of surgery. We try to make accommodations for patient's preferences in scheduling surgery, but sometimes the OR schedule or the surgeon's schedule prevents [place] from making those accommodations.  If you have not heard from our office [phone]) in 5 working days, call the office and ask for your surgeon's nurse.  If you have other questions about your diagnosis, plan, or surgery, call the office and ask for your surgeon's nurse.  Pt Education - Pamphlet Given - The Hemorrhoid Book: discussed with patient and provided information. Pt Education - [facility] Hemorrhoids (Preethi Scantlebury): discussed with patient and provided information. The anatomy & physiology of the anorectal region was discussed. The pathophysiology of hemorrhoids  and differential diagnosis was discussed. Natural history risks without surgery was discussed. I stressed the importance of a bowel regimen to have daily soft bowel movements to minimize progression of disease. Interventions such as sclerotherapy & banding were discussed.  The patient's symptoms are not adequately controlled by medicines and other non-operative treatments. I feel the risks & problems of no surgery outweigh the operative risks; therefore, I recommended surgery to treat the hemorrhoids by ligation, pexy, and possible resection.  Risks such as bleeding, infection, urinary difficulties, need for further treatment, heart attack, death, and other risks were discussed. I noted a good likelihood this will help address the problem. Goals of post-operative recovery were discussed as well. Possibility that this will not correct all symptoms was explained. Post-operative pain, bleeding, constipation, and other problems after surgery were discussed. We will work to minimize complications. Educational handouts further explaining the pathology, treatment options, and bowel regimen were given as well. Questions  were answered. The patient expresses understanding & wishes to proceed with surgery.   ENCOUNTER FOR PREOPERATIVE EXAMINATION FOR GENERAL SURGICAL PROCEDURE (Z01.818)  Current Plans You are being scheduled for surgery- Our schedulers will call you.  You should hear from our office's scheduling department within 5 working days about the location, date, and time of surgery. We try to make accommodations for patient's preferences in scheduling surgery, but sometimes the OR schedule or the surgeon's schedule prevents [place] from making those accommodations.  If you have not heard from our office [phone]) in 5 working days, call the office and ask for your surgeon's nurse.  If you have other questions about your diagnosis, plan, or surgery, call the office and ask for your  surgeon's nurse.  Pt Education - [facility] Rectal Prep for Anorectal outpatient/office surgery: discussed with patient and provided information. Pt Education - [facility] Rectal Surgery HCI (Lular Letson): discussed with patient and provided information.  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 She had colonoscopy 2014 negative for any small bowel or colonic mucosal deficiency such as infection or inflammatory bowel. We'll defer back to GI for further workup.  Current Plans Pt Education - [facility] IBS patient info: discussed with patient and provided information. Pt Education - [facility] Good Bowel Health ([facility])   Kelly Hector, MD, FACS, MASCRS Gastrointestinal and Minimally Invasive Surgery  [facility] [address] [place], [place] [id number] [phone] Main / Paging [phone] Fax

## 2020-03-14 ENCOUNTER — Encounter: Payer: Self-pay | Admitting: *Deleted

## 2020-03-15 ENCOUNTER — Encounter: Payer: Self-pay | Admitting: *Deleted

## 2020-03-15 ENCOUNTER — Ambulatory Visit (INDEPENDENT_AMBULATORY_CARE_PROVIDER_SITE_OTHER): Payer: Medicare Other | Admitting: Physician Assistant

## 2020-03-15 VITALS — BP 110/50 | HR 88 | Temp 98.5°F | Ht 60.5 in | Wt 154.0 lb

## 2020-03-15 DIAGNOSIS — Z860101 Personal history of adenomatous and serrated colon polyps: Secondary | ICD-10-CM

## 2020-03-15 DIAGNOSIS — Z86010 Personal history of colonic polyps: Secondary | ICD-10-CM | POA: Diagnosis not present

## 2020-03-15 DIAGNOSIS — R1314 Dysphagia, pharyngoesophageal phase: Secondary | ICD-10-CM

## 2020-03-15 MED ORDER — SUPREP BOWEL PREP KIT 17.5-3.13-1.6 GM/177ML PO SOLN: 1.0000 | ORAL | 0 refills | Status: DC

## 2020-03-15 NOTE — Patient Instructions (Addendum)
You have been scheduled for an endoscopy and colonoscopy. Please follow the written instructions given to you at your visit today. Please pick up your prep supplies at the pharmacy within the next 1-3 days. If you use inhalers (even only as needed), please bring them with you on the day of your procedure. Your physician has requested that you go to www.startemmi.com and enter the access code given to you at your visit today. This web site gives a general overview about your procedure. However, you should still follow specific instructions given to you by our office regarding your preparation for the procedure.  Continue pantoprazole 40 mg daily  If you are age 4 or older, your body mass index should be between 23-30. Your Body mass index is 29.58 kg/m. If this is out of the aforementioned range listed, please consider follow up with your Primary Care Provider.  If you are age 82 or younger, your body mass index should be between 19-25. Your Body mass index is 29.58 kg/m. If this is out of the aformentioned range listed, please consider follow up with your Primary Care Provider.   Due to recent changes in healthcare laws, you may see the results of your imaging and laboratory studies on MyChart before your provider has had a chance to review them.  We understand that in some cases there may be results that are confusing or concerning to you. Not all laboratory results come back in the same time frame and the provider may be waiting for multiple results in order to interpret others.  Please give [place] 48 hours in order for your provider to thoroughly review all the results before contacting the office for clarification of your results.

## 2020-03-15 NOTE — Progress Notes (Signed)
Noted  

## 2020-03-15 NOTE — Progress Notes (Signed)
Chief Complaint: History of polyps, dysphagia  HPI:    Kelly Mooney is a 74 year old Caucasian female, known to Dr. Henrene Pastor, with a past medical history as listed below including being diagnosed with COVID-19 back in November 2020, who was referred to me by Dr. Johney Maine, general surgery, for a complaint of dysphagia and history of colon polyps.        09/01/2001 colonoscopy done for surveillance of adenomatous polyps, with initial polypectomy performed in May 2000, at that exam there was only diverticulosis in the descending colon to sigmoid colon and there were 2 diminutive polyps removed from the rectum.  Polyps were destroyed at time of retrieval.  Repeat recommended in 10 years.    05/12/2016 EGD with balloon dilation up to 20 mm, benign-appearing esophageal stenosis, 4 cm hiatal hernia.  (Of note patient had 2 previous endoscopies with dilation as well)    Today, the patient presents to clinic and tells me that she was recently seen by Dr. Johney Maine in surgery to have a hemorrhoidectomy, but he recommended that she have a colonoscopy prior to having this procedure since she is overdue.  She is aware of her history of polyps and would like to have another colonoscopy.    Patient also complains of dysphagia which has been worsening over the past 6 months to a year.  Tells me that every day she will have regurgitation of something, sometimes it is solid and sometimes it is liquids, but something will get stuck in her throat and she will have to get it out.  She continues on Pantoprazole 40 mg daily and denies any overt heartburn or reflux symptoms.    Denies fever, chills, weight loss, change in bowel habits, abdominal pain or symptoms that awaken her from sleep.  Past Medical History:  Diagnosis Date  . Arthritis   . Asthma    since age 54  . Colon polyps   . COPD (chronic obstructive pulmonary disease) ([place])   . COVID-19 09/2019  . Diabetes mellitus   . Diverticulosis   . Esophageal stricture     . GERD (gastroesophageal reflux disease)   . Hemorrhoids   . Hiatal hernia   . Hypercholesteremia   . Hypertension   . Hypothyroidism   . IBS (irritable bowel syndrome)   . Paraesophageal hernia   . Stroke ([place])   . Tubular adenoma of colon     Past Surgical History:  Procedure Laterality Date  . BUNIONECTOMY Right 10/2012  . CATARACT EXTRACTION, BILATERAL    . CHOLECYSTECTOMY  1980  . ESOPHAGEAL MANOMETRY N/A 07/15/2015   Procedure: ESOPHAGEAL MANOMETRY (EM);  Surgeon: Irene Shipper, MD;  Location: [facility];  Service: Endoscopy;  Laterality: N/A;  . PARTIAL HYSTERECTOMY    . RHINOPLASTY     nasal polyps removed dr. Barrie Folk    Current Outpatient Medications  Medication Sig Dispense Refill  . albuterol (PROVENTIL HFA;VENTOLIN HFA) 108 (90 BASE) MCG/ACT inhaler Inhale 2 puffs into the lungs 4 (four) times daily.     Marland Kitchen amLODipine (NORVASC) 10 MG tablet Take 10 mg by mouth daily.    Marland Kitchen atorvastatin (LIPITOR) 20 MG tablet Take 1 tablet (20 mg total) by mouth daily. 90 tablet 3  . chlorthalidone (HYGROTON) 25 MG tablet Take 12.5-25 mg by mouth every morning.    . clobetasol cream (TEMOVATE) 4.74 % Apply 1 application topically See admin instructions. Apply a thin layer to affected areas 2 times a day as needed for irritation  4  .  escitalopram (LEXAPRO) 5 MG tablet Take 5 mg by mouth daily.    . fenofibrate 160 MG tablet Take 160 mg by mouth daily.    . fluocinonide (LIDEX) 0.05 % external solution Apply 1 application topically See admin instructions. Apply to affected areas daily as needed for itching    . fluticasone-salmeterol (ADVAIR HFA) 115-21 MCG/ACT inhaler Inhale 1 puff into the lungs 2 (two) times daily.    Marland Kitchen glimepiride (AMARYL) 4 MG tablet Take 4 mg by mouth 2 (two) times daily.    . hydroxypropyl methylcellulose / hypromellose (ISOPTO TEARS / GONIOVISC) 2.5 % ophthalmic solution Place 1 drop into both eyes as needed for dry eyes.    Marland Kitchen levothyroxine (SYNTHROID,  LEVOTHROID) 137 MCG tablet Take 137 mcg by mouth daily before breakfast.    . metFORMIN (GLUCOPHAGE-XR) 500 MG 24 hr tablet Take 1,000 mg by mouth 2 (two) times daily.  4  . mometasone (ELOCON) 0.1 % cream APPLY THIN LAYER TO AFFECTED AREA ONCE DAILY    . montelukast (SINGULAIR) 10 MG tablet Take 10 mg by mouth daily.   4  . pantoprazole (PROTONIX) 40 MG tablet TAKE 1 TABLET (40 MG TOTAL) BY MOUTH DAILY. 30 tablet 3  . quinapril (ACCUPRIL) 40 MG tablet Take 40 mg by mouth daily.     Marland Kitchen topiramate (TOPAMAX) 50 MG tablet Take 1 tablet (50 mg total) by mouth daily. 30 tablet 6  . valACYclovir (VALTREX) 1000 MG tablet Take 2 tablets by mouth every 12 (twelve) hours as needed.    [place]us Gunning BOWEL PREP KIT 17.5-3.13-1.6 GM/177ML SOLN Take 1 kit by mouth as directed. For colonoscopy prep BIN: [id number] PCN: CN GROUP: [id number] ID: [id number] 354 mL 0   No current facility-administered medications for this visit.    Allergies as of 03/15/2020 - Review Complete 03/15/2020  Allergen Reaction Noted  . 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 Ivp dye [iodinated diagnostic agents] Anaphylaxis, Shortness Of Breath, and Other (See Comments) 06/14/2012  . Crestor [rosuvastatin calcium]  08/04/2018  . Iodine Itching 06/12/2015  . Sulfa antibiotics Rash 07/05/2018    Family History  Problem Relation Age of Onset  . Diabetes Mellitus I Mother   . Hyperlipidemia Mother   . Asthma Mother   . Heart attack Father   . Heart attack Brother   . Asthma Brother   . Heart attack Brother   . Colon cancer Neg Hx   . Colon polyps Neg Hx     Social History   Socioeconomic History  . Marital status: Widowed    Spouse name: Not on file  . Number of children: 2  . Years of education: Not on file  . Highest education level: Not on file  Occupational History  . Occupation: Retired  Tobacco Use  . Smoking status: Former Smoker    Packs/day: 2.00    Years: 10.00    Pack years: 20.00    Quit date: 10/02/1996     Years since quitting: 23.4  . Smokeless tobacco: Never Used  . Tobacco comment: quit 1997  Substance and Sexual Activity  . Alcohol use: No    Alcohol/week: 0.0 standard drinks  . Drug use: No  . Sexual activity: Not on file  Other Topics Concern  . Not on file  Social History Narrative  . Not on file   Social Determinants of Health   Financial Resource Strain:   . Difficulty of Paying Living Expenses:   Food Insecurity:   . Worried About Running  Out of Food in the Last Year:   . [place] in the Last Year:   Transportation Needs:   . Lack of Transportation (Medical):   Marland Kitchen Lack of Transportation (Non-Medical):   Physical Activity:   . Days of Exercise per Week:   . Minutes of Exercise per Session:   Stress:   . Feeling of Stress :   Social Connections:   . Frequency of Communication with Friends and Family:   . Frequency of Social Gatherings with Friends and Family:   . Attends Religious Services:   . Active Member of Clubs or Organizations:   . Attends Archivist Meetings:   Marland Kitchen Marital Status:   Intimate Partner Violence:   . Fear of Current or Ex-Partner:   . Emotionally Abused:   Marland Kitchen Physically Abused:   . Sexually Abused:     Review of Systems:    Constitutional: No weight loss, fever or chills Skin: No rash  Cardiovascular: No chest pain Respiratory: No SOB  Gastrointestinal: See HPI and otherwise negative Genitourinary: No dysuria  Neurological: No headache, dizziness or syncope Musculoskeletal: No new muscle or joint pain Hematologic: No bleeding Psychiatric: No history of depression or anxiety   Physical Exam:  Vital signs: BP (!) 110/50 (BP Location: Left Arm, Patient Position: Sitting, Cuff Size: Normal)   Pulse 88   Temp 98.5 F (36.9 C)   Ht 5' 0.5" (1.537 m) Comment: height measured without shoes  Wt 154 lb (69.9 kg)   BMI 29.58 kg/m   Constitutional:   Pleasant Caucasian female appears to be in NAD, Well developed, Well  nourished, alert and cooperative Head:  Normocephalic and atraumatic. Eyes:   PEERL, EOMI. No icterus. Conjunctiva pink. Ears:  Normal auditory acuity. Neck:  Supple Throat: Oral cavity and pharynx without inflammation, swelling or lesion.  Respiratory: Respirations even and unlabored. Lungs clear to auscultation bilaterally.   No wheezes, crackles, or rhonchi.  Cardiovascular: Normal S1, S2. No MRG. Regular rate and rhythm. No peripheral edema, cyanosis or pallor.  Gastrointestinal:  Soft, nondistended, nontender. No rebound or guarding. Normal bowel sounds. No appreciable masses or hepatomegaly. Rectal:  Not performed.  Msk:  Symmetrical without gross deformities. Without edema, no deformity or joint abnormality.  Neurologic:  Alert and  oriented x4;  grossly normal neurologically.  Skin:   Dry and intact without significant lesions or rashes. Psychiatric: Demonstrates good judgement and reason without abnormal affect or behaviors.  MOST RECENT LABS AND IMAGING: CBC    Component Value Date/Time   WBC 6.4 10/06/2019 0150   RBC 3.84 (L) 10/06/2019 0150   HGB 11.0 (L) 10/06/2019 0150   HCT 33.5 (L) 10/06/2019 0150   PLT 250 10/06/2019 0150   MCV 87.2 10/06/2019 0150   MCH 28.6 10/06/2019 0150   MCHC 32.8 10/06/2019 0150   RDW 12.9 10/06/2019 0150   LYMPHSABS 1.2 10/06/2019 0150   MONOABS 0.4 10/06/2019 0150   EOSABS 0.0 10/06/2019 0150   BASOSABS 0.0 10/06/2019 0150    CMP     Component Value Date/Time   NA 141 10/06/2019 0150   K 3.5 10/06/2019 0150   CL 102 10/06/2019 0150   CO2 26 10/06/2019 0150   GLUCOSE 233 (H) 10/06/2019 0150   BUN 30 (H) 10/06/2019 0150   CREATININE 0.98 10/06/2019 0150   CALCIUM 8.6 (L) 10/06/2019 0150   PROT 6.0 (L) 10/06/2019 0150   ALBUMIN 3.3 (L) 10/06/2019 0150   AST 27 10/06/2019 0150  ALT 23 10/06/2019 0150   ALKPHOS 33 (L) 10/06/2019 0150   BILITOT 0.6 10/06/2019 0150   GFRNONAA 57 (L) 10/06/2019 0150   GFRAA >60 10/06/2019 0150     Assessment: 1.  History of adenomatous polyps: Last colonoscopy in 2002, recommendations to repeat in 10 years 2.  Dysphagia: History of repeat endoscopies the last 05/12/2016 with dilation of stenosis; likely the same  Plan: 1.  Scheduled patient for an EGD with dilation and surveillance colonoscopy in the [facility] with Dr. Henrene Pastor.  Did discuss risks, benefits, limitations and alternatives and the patient agrees to proceed.  She has had both of her Covid vaccines. 2.  Reviewed antidysphagia measures. 3.  Continue Pantoprazole 40 mg daily. 4.  Patient to follow in clinic per recommendations from Dr. Henrene Pastor after time of procedures.  Kelly Newer, PA-C [facility] 03/15/2020, 1:14 PM  Cc: Lawerance Cruel, MD  CC: Dr. Johney Maine, General Surgery

## 2020-05-09 ENCOUNTER — Encounter: Payer: Self-pay | Admitting: Internal Medicine

## 2020-05-23 ENCOUNTER — Ambulatory Visit (AMBULATORY_SURGERY_CENTER): Payer: Medicare Other | Admitting: Internal Medicine

## 2020-05-23 ENCOUNTER — Encounter: Payer: Self-pay | Admitting: Internal Medicine

## 2020-05-23 ENCOUNTER — Other Ambulatory Visit: Payer: Self-pay

## 2020-05-23 VITALS — BP 118/61 | HR 75 | Temp 96.4°F | Resp 12 | Ht 60.0 in | Wt 154.0 lb

## 2020-05-23 DIAGNOSIS — D123 Benign neoplasm of transverse colon: Secondary | ICD-10-CM | POA: Diagnosis not present

## 2020-05-23 DIAGNOSIS — Z86010 Personal history of colonic polyps: Secondary | ICD-10-CM

## 2020-05-23 DIAGNOSIS — K219 Gastro-esophageal reflux disease without esophagitis: Secondary | ICD-10-CM | POA: Diagnosis not present

## 2020-05-23 DIAGNOSIS — K269 Duodenal ulcer, unspecified as acute or chronic, without hemorrhage or perforation: Secondary | ICD-10-CM | POA: Diagnosis not present

## 2020-05-23 DIAGNOSIS — Z860101 Personal history of adenomatous and serrated colon polyps: Secondary | ICD-10-CM

## 2020-05-23 DIAGNOSIS — K222 Esophageal obstruction: Secondary | ICD-10-CM

## 2020-05-23 DIAGNOSIS — R1314 Dysphagia, pharyngoesophageal phase: Secondary | ICD-10-CM | POA: Diagnosis not present

## 2020-05-23 MED ORDER — SODIUM CHLORIDE 0.9 % IV SOLN
500.0000 mL | Freq: Once | INTRAVENOUS | Status: DC
Start: 2020-05-23 — End: 2020-05-23

## 2020-05-23 NOTE — Progress Notes (Signed)
A/ox3, pleased with MAC, report to RN 

## 2020-05-23 NOTE — Progress Notes (Signed)
Pt's states no medical or surgical changes since previsit or office visit.  VS CW  

## 2020-05-23 NOTE — Op Note (Signed)
[place] Patient Name: Kelly Mooney Procedure Date: 05/23/2020 1:24 PM MRN: [id number] Endoscopist: Docia Chuck. Henrene Pastor , MD Age: 74 Referring MD:  Date of Birth: [birth year] Gender: Female Account #: [id number] Procedure:                Colonoscopy with cold snare polypectomy x 1 Indications:              High risk colon cancer surveillance: Personal                            history of non-advanced adenoma. Previous                            examinations 2000 and 2002. Colonoscopy requested                            by general surgery?"planning hemorrhoidectomy. Medicines:                Monitored Anesthesia Care Procedure:                Pre-Anesthesia Assessment:                           - Prior to the procedure, a History and Physical                            was performed, and patient medications and                            allergies were reviewed. The patient's tolerance of                            previous anesthesia was also reviewed. The risks                            and benefits of the procedure and the sedation                            options and risks were discussed with the patient.                            All questions were answered, and informed consent                            was obtained. Prior Anticoagulants: The patient has                            taken no previous anticoagulant or antiplatelet                            agents. ASA Grade Assessment: II - A patient with                            mild systemic disease. After reviewing the risks  and benefits, the patient was deemed in                            satisfactory condition to undergo the procedure.                           After obtaining informed consent, the colonoscope                            was passed under direct vision. Throughout the                            procedure, the patient's blood pressure, pulse, and                             oxygen saturations were monitored continuously. The                            [id number] was introduced through the anus and                            advanced to the the cecum, identified by                            appendiceal orifice and ileocecal valve. The                            ileocecal valve, appendiceal orifice, and rectum                            were photographed. The quality of the bowel                            preparation was excellent. The colonoscopy was                            performed without difficulty. The patient tolerated                            the procedure well. The bowel preparation used was                            SUPREP via split dose instruction. Scope In: 1:40:15 PM Scope Out: 1:57:50 PM Scope Withdrawal Time: 0 hours 8 minutes 51 seconds  Total Procedure Duration: 0 hours 17 minutes 35 seconds  Findings:                 A 2 mm polyp was found in the transverse colon. The                            polyp was removed with a cold snare. Resection and                            retrieval were complete.  Many small and large-mouthed diverticula were found                            throughout the entire colon. However, severe                            changes in the left colon with related sigmoid                            stenosis.                           The exam was otherwise without abnormality on                            direct and retroflexion views. Small internal                            hemorrhoids noted. Complications:            No immediate complications. Estimated blood loss:                            None. Estimated Blood Loss:     Estimated blood loss: none. Impression:               - One 2 mm polyp in the transverse colon, removed                            with a cold snare. Resected and retrieved.                           - Diverticulosis throughout with sigmoid stenosis.                            - The examination was otherwise normal on direct                            and retroflexion views. Recommendation:           - Repeat colonoscopy is not recommended for                            surveillance.                           - Patient has a contact number available for                            emergencies. The signs and symptoms of potential                            delayed complications were discussed with the                            patient. Return to normal activities tomorrow.  Written discharge instructions were provided to the                            patient.                           - Resume previous diet.                           - Continue present medications.                           - Await pathology results.                           - EGD today. Please see report Docia Chuck. Henrene Pastor, MD 05/23/2020 2:21:59 PM This report has been signed electronically.

## 2020-05-23 NOTE — Patient Instructions (Signed)
Handouts on polyps, diverticulosis, stricture, and post dilation diet given to you today  Await pathology results from Dr. Henrene Pastor    YOU HAD AN ENDOSCOPIC PROCEDURE TODAY AT THE [facility]:   Refer to the procedure report that was given to you for any specific questions about what was found during the examination.  If the procedure report does not answer your questions, please call your gastroenterologist to clarify.  If you requested that your care partner not be given the details of your procedure findings, then the procedure report has been included in a sealed envelope for you to review at your convenience later.  YOU SHOULD EXPECT: Some feelings of bloating in the abdomen. Passage of more gas than usual.  Walking can help get rid of the air that was put into your GI tract during the procedure and reduce the bloating. If you had a lower endoscopy (such as a colonoscopy or flexible sigmoidoscopy) you may notice spotting of blood in your stool or on the toilet paper. If you underwent a bowel prep for your procedure, you may not have a normal bowel movement for a few days.  Please Note:  You might notice some irritation and congestion in your nose or some drainage.  This is from the oxygen used during your procedure.  There is no need for concern and it should clear up in a day or so.  SYMPTOMS TO REPORT IMMEDIATELY:   Following lower endoscopy (colonoscopy or flexible sigmoidoscopy):  Excessive amounts of blood in the stool  Significant tenderness or worsening of abdominal pains  Swelling of the abdomen that is new, acute  Fever of 100F or higher   Following upper endoscopy (EGD)  Vomiting of blood or coffee ground material  New chest pain or pain under the shoulder blades  Painful or persistently difficult swallowing  New shortness of breath  Fever of 100F or higher  Black, tarry-looking stools  For urgent or emergent issues, a gastroenterologist can be reached at any  hour by calling [phone]. Do not use MyChart messaging for urgent concerns.    DIET:  Post dilation diet today. You may proceed to your regular diet tomorrow.  Drink plenty of fluids but you should avoid alcoholic beverages for 24 hours.  ACTIVITY:  You should plan to take it easy for the rest of today and you should NOT DRIVE or use heavy machinery until tomorrow (because of the sedation medicines used during the test).    FOLLOW UP: Our staff will call the number listed on your records 48-72 hours following your procedure to check on you and address any questions or concerns that you may have regarding the information given to you following your procedure. If we do not reach you, we will leave a message.  We will attempt to reach you two times.  During this call, we will ask if you have developed any symptoms of COVID 19. If you develop any symptoms (ie: fever, flu-like symptoms, shortness of breath, cough etc.) before then, please call [phone].  If you test positive for Covid 19 in the 2 weeks post procedure, please call and report this information to [place].    If any biopsies were taken you will be contacted by phone or by letter within the next 1-3 weeks.  Please call us at [phone] if you have not heard about the biopsies in 3 weeks.    SIGNATURES/CONFIDENTIALITY: You and/or your care partner have signed paperwork which will be entered  your electronic medical record.  These signatures attest to the fact that that the information above on your After Visit Summary has been reviewed and is understood.  Full responsibility of the confidentiality of this discharge information lies with you and/or your care-partner. 

## 2020-05-23 NOTE — Op Note (Signed)
[place] Patient Name: Kelly Mooney Procedure Date: 05/23/2020 1:23 PM MRN: [id number] Endoscopist: Docia Chuck. Henrene Pastor , MD Age: 74 Referring MD:  Date of Birth: 16-Jun-1946 Gender: Female Account #: [id number] Procedure:                Upper GI endoscopy with biopsy; balloon dilation of                            the esophagus 20 mm max Indications:              Dysphagia Medicines:                Monitored Anesthesia Care Procedure:                Pre-Anesthesia Assessment:                           - Prior to the procedure, a History and Physical                            was performed, and patient medications and                            allergies were reviewed. The patient's tolerance of                            previous anesthesia was also reviewed. The risks                            and benefits of the procedure and the sedation                            options and risks were discussed with the patient.                            All questions were answered, and informed consent                            was obtained. Prior Anticoagulants: The patient has                            taken no previous anticoagulant or antiplatelet                            agents. ASA Grade Assessment: II - A patient with                            mild systemic disease. After reviewing the risks                            and benefits, the patient was deemed in                            satisfactory condition to undergo the procedure.  After obtaining informed consent, the endoscope was                            passed under direct vision. Throughout the                            procedure, the patient's blood pressure, pulse, and                            oxygen saturations were monitored continuously. The                            Endoscope was introduced through the mouth, and                            advanced to the second part of duodenum.  The upper                            GI endoscopy was accomplished without difficulty.                            The patient tolerated the procedure well. Scope In: Scope Out: Findings:                 One benign-appearing, dense fibrotic ringlike                            intrinsic moderate stricture was found 37 cm from                            the incisors. This stenosis measured 1.4 cm (inner                            diameter). The stenosis was traversed. After                            completing the endoscopic survey A TTS dilator was                            passed through the scope. Dilation with an 18-19-20                            mm balloon dilator was performed to 20 mm. Post                            inspection dilation revealed disruption of the ring                            with improved diameter                           The stomach was normal, save moderate hiatal hernia.  The examined duodenum multiple small ulcers. The                            post bulbar duodenum was normal. Biopsies were                            taken with a cold forceps for Helicobacter pylori                            testing using CLOtest.                           The cardia and gastric fundus were normal on                            retroflexion. Complications:            No immediate complications. Estimated Blood Loss:     Estimated blood loss: none. Impression:               1. GERD complicated by esophageal stricture status                            post dilation                           2. Small bulbar erosions status post CLO biopsy. Recommendation:           1. Post dilation diet                           2. Resume previous medications                           3. Follow-up CLO biopsy                           4. Contact the office for any persistent or                            recurrent problems with swallowing                            5. Return to the care of your primary provider. Docia Chuck. Henrene Pastor, MD 05/23/2020 2:27:50 PM This report has been signed electronically.

## 2020-05-23 NOTE — Progress Notes (Signed)
Called to room to assist during endoscopic procedure.  Patient ID and intended procedure confirmed with present staff. Received instructions for my participation in the procedure from the performing physician.  

## 2020-05-24 LAB — HELICOBACTER PYLORI SCREEN-BIOPSY: UREASE: NEGATIVE

## 2020-05-27 ENCOUNTER — Telehealth: Payer: Self-pay

## 2020-05-27 NOTE — Telephone Encounter (Signed)
1st follow up call made.  NALM 

## 2020-05-27 NOTE — Telephone Encounter (Signed)
2nd follow up call made.  NALM 

## 2020-05-31 ENCOUNTER — Encounter: Payer: Self-pay | Admitting: Internal Medicine

## 2020-06-03 ENCOUNTER — Other Ambulatory Visit (HOSPITAL_COMMUNITY): Payer: Medicare Other

## 2020-07-25 ENCOUNTER — Ambulatory Visit: Payer: Self-pay | Admitting: Surgery

## 2020-07-25 NOTE — Progress Notes (Signed)
DUE TO COVID-19 ONLY ONE VISITOR IS ALLOWED TO COME WITH YOU AND STAY IN THE WAITING ROOM ONLY DURING PRE OP AND PROCEDURE DAY OF SURGERY. THE 1 VISITOR  MAY VISIT WITH YOU AFTER SURGERY IN YOUR PRIVATE ROOM DURING VISITING HOURS ONLY!  YOU NEED TO HAVE A COVID 19 TEST 07/29/20 @_______ , THIS TEST MUST BE DONE BEFORE SURGERY,  COVID TESTING SITE [place] [address], IT IS ON THE RIGHT GOING OUT [address] APPROXIMATELY  2 MINUTES PAST [employer] ON THE RIGHT. ONCE YOUR COVID TEST IS COMPLETED,  PLEASE BEGIN THE QUARANTINE INSTRUCTIONS AS OUTLINED IN YOUR HANDOUT.                Kelly Mooney  07/25/2020   Your procedure is scheduled on: 08/01/2020    Report to [facility] Main  Entrance   Report to admitting at     0830am     Call this number if you have problems the morning of surgery [phone]    Remember: Do not eat food , candy gum or mints :After Midnight. You may have clear liquids from midnight until   0730am    CLEAR LIQUID DIET   Foods Allowed                                                                       Coffee and tea, regular and decaf                              Plain Jell-O any favor except red or purple                                            Fruit ices (not with fruit pulp)                                      Iced Popsicles                                     Carbonated beverages, regular and diet                                    Cranberry, grape and apple juices Sports drinks like Gatorade Lightly seasoned clear broth or consume(fat free) Sugar, honey syrup   _____________________________________________________________________    BRUSH YOUR TEETH MORNING OF SURGERY AND RINSE YOUR MOUTH OUT, NO CHEWING GUM CANDY OR MINTS.     Take these medicines the morning of surgery with A SIP OF WATER:  Inhalers as usual and bring, Synthroid, Protonix, , amlodipine   DO NOT TAKE ANY DIABETIC MEDICATIONS  DAY OF YOUR SURGERY  You may not have any metal on your body including hair pins and              piercings  Do not wear jewelry, make-up, lotions, powders or perfumes, deodorant             Do not wear nail polish on your fingernails.  Do not shave  48 hours prior to surgery.              Men may shave face and neck.   Do not bring valuables to the hospital. [place].  Contacts, dentures or bridgework may not be worn into surgery.  Leave suitcase in the car. After surgery it may be brought to your room.     Patients discharged the day of surgery will not be allowed to drive home. IF YOU ARE HAVING SURGERY AND GOING HOME THE SAME DAY, YOU MUST HAVE AN ADULT TO DRIVE YOU HOME AND BE WITH YOU FOR 24 HOURS. YOU MAY GO HOME BY TAXI OR UBER OR ORTHERWISE, BUT AN ADULT MUST ACCOMPANY YOU HOME AND STAY WITH YOU FOR 24 HOURS.  Name and phone number of your driver:  Special Instructions: N/A              Please read over the following fact sheets you were given: _____________________________________________________________________  [facility] - Preparing for Surgery Before surgery, you can play an important role.  Because skin is not sterile, your skin needs to be as free of germs as possible.  You can reduce the number of germs on your skin by washing with CHG (chlorahexidine gluconate) soap before surgery.  CHG is an antiseptic cleaner which kills germs and bonds with the skin to continue killing germs even after washing. Please DO NOT use if you have an allergy to CHG or antibacterial soaps.  If your skin becomes reddened/irritated stop using the CHG and inform your nurse when you arrive at Short Stay. Do not shave (including legs and underarms) for at least 48 hours prior to the first CHG shower.  You may shave your face/neck. Please follow these instructions carefully:  1.  Shower with CHG Soap the night before  surgery and the  morning of Surgery.  2.  If you choose to wash your hair, wash your hair first as usual with your  normal  shampoo.  3.  After you shampoo, rinse your hair and body thoroughly to remove the  shampoo.                           4.  Use CHG as you would any other liquid soap.  You can apply chg directly  to the skin and wash                       Gently with a scrungie or clean washcloth.  5.  Apply the CHG Soap to your body ONLY FROM THE NECK DOWN.   Do not use on face/ open                           Wound or open sores. Avoid contact with eyes, ears mouth and genitals (private parts).  Wash face,  Genitals (private parts) with your normal soap.             6.  Wash thoroughly, paying special attention to the area where your surgery  will be performed.  7.  Thoroughly rinse your body with warm water from the neck down.  8.  DO NOT shower/wash with your normal soap after using and rinsing off  the CHG Soap.                9.  Pat yourself dry with a clean towel.            10.  Wear clean pajamas.            11.  Place clean sheets on your bed the night of your first shower and do not  sleep with pets. Day of Surgery : Do not apply any lotions/deodorants the morning of surgery.  Please wear clean clothes to the hospital/surgery center.  FAILURE TO FOLLOW THESE INSTRUCTIONS MAY RESULT IN THE CANCELLATION OF YOUR SURGERY PATIENT SIGNATURE_________________________________  NURSE SIGNATURE__________________________________  ________________________________________________________________________

## 2020-07-25 NOTE — Progress Notes (Deleted)
MORNING OF SURGERY DRINK:   DRINK 1 G2 drink BEFORE YOU LEAVE HOME, DRINK ALL OF THE  G2 DRINK AT ONE TIME.   NO SOLID FOOD AFTER 600 PM THE NIGHT BEFORE YOUR SURGERY. YOU MAY DRINK CLEAR FLUIDS. THE G2 DRINK YOU DRINK BEFORE YOU LEAVE HOME WILL BE THE LAST FLUIDS YOU DRINK BEFORE SURGERY.  PAIN IS EXPECTED AFTER SURGERY AND WILL NOT BE COMPLETELY ELIMINATED. AMBULATION AND TYLENOL WILL HELP REDUCE INCISIONAL AND GAS PAIN. MOVEMENT IS KEY!  YOU ARE EXPECTED TO BE OUT OF BED WITHIN 4 HOURS OF ADMISSION TO YOUR PATIENT ROOM.  SITTING IN THE RECLINER THROUGHOUT THE DAY IS IMPORTANT FOR DRINKING FLUIDS AND MOVING GAS THROUGHOUT THE GI TRACT.  COMPRESSION STOCKINGS SHOULD BE WORN [place] UNLESS YOU ARE WALKING.   INCENTIVE SPIROMETER SHOULD BE USED EVERY HOUR WHILE AWAKE TO DECREASE POST-OPERATIVE COMPLICATIONS SUCH AS PNEUMONIA.  WHEN DISCHARGED HOME, IT IS IMPORTANT TO CONTINUE TO WALK EVERY HOUR AND USE THE INCENTIVE SPIROMETER EVERY HOUR.        MORNING OF SURGERY DRINK:   DRINK 1 G2 drink BEFORE YOU LEAVE HOME, DRINK ALL OF THE  G2 DRINK AT ONE TIME.   NO SOLID FOOD AFTER 600 PM THE NIGHT BEFORE YOUR SURGERY. YOU MAY DRINK CLEAR FLUIDS. THE G2 DRINK YOU DRINK BEFORE YOU LEAVE HOME WILL BE THE LAST FLUIDS YOU DRINK BEFORE SURGERY.  PAIN IS EXPECTED AFTER SURGERY AND WILL NOT BE COMPLETELY ELIMINATED. AMBULATION AND TYLENOL WILL HELP REDUCE INCISIONAL AND GAS PAIN. MOVEMENT IS KEY!  YOU ARE EXPECTED TO BE OUT OF BED WITHIN 4 HOURS OF ADMISSION TO YOUR PATIENT ROOM.  SITTING IN THE RECLINER THROUGHOUT THE DAY IS IMPORTANT FOR DRINKING FLUIDS AND MOVING GAS THROUGHOUT THE GI TRACT.  COMPRESSION STOCKINGS SHOULD BE WORN [place] UNLESS YOU ARE WALKING.   INCENTIVE SPIROMETER SHOULD BE USED EVERY HOUR WHILE AWAKE TO DECREASE POST-OPERATIVE COMPLICATIONS SUCH AS PNEUMONIA.  WHEN DISCHARGED HOME, IT IS IMPORTANT TO CONTINUE TO WALK EVERY HOUR AND USE  THE INCENTIVE SPIROMETER EVERY HOUR.        MORNING OF SURGERY DRINK:   DRINK 1 G2 drink BEFORE YOU LEAVE HOME, DRINK ALL OF THE  G2 DRINK AT ONE TIME.   NO SOLID FOOD AFTER 600 PM THE NIGHT BEFORE YOUR SURGERY. YOU MAY DRINK CLEAR FLUIDS. THE G2 DRINK YOU DRINK BEFORE YOU LEAVE HOME WILL BE THE LAST FLUIDS YOU DRINK BEFORE SURGERY.  PAIN IS EXPECTED AFTER SURGERY AND WILL NOT BE COMPLETELY ELIMINATED. AMBULATION AND TYLENOL WILL HELP REDUCE INCISIONAL AND GAS PAIN. MOVEMENT IS KEY!  YOU ARE EXPECTED TO BE OUT OF BED WITHIN 4 HOURS OF ADMISSION TO YOUR PATIENT ROOM.  SITTING IN THE RECLINER THROUGHOUT THE DAY IS IMPORTANT FOR DRINKING FLUIDS AND MOVING GAS THROUGHOUT THE GI TRACT.  COMPRESSION STOCKINGS SHOULD BE WORN [place] UNLESS YOU ARE WALKING.   INCENTIVE SPIROMETER SHOULD BE USED EVERY HOUR WHILE AWAKE TO DECREASE POST-OPERATIVE COMPLICATIONS SUCH AS PNEUMONIA.  WHEN DISCHARGED HOME, IT IS IMPORTANT TO CONTINUE TO WALK EVERY HOUR AND USE THE INCENTIVE SPIROMETER EVERY HOUR.        DUE TO COVID-19 ONLY ONE VISITOR IS ALLOWED TO COME WITH YOU AND STAY IN THE WAITING ROOM ONLY DURING PRE OP AND PROCEDURE DAY OF SURGERY. THE 1 VISITOR  MAY VISIT WITH YOU AFTER SURGERY IN YOUR PRIVATE ROOM DURING VISITING HOURS ONLY!  YOU NEED TO HAVE A COVID 19  TEST ON_9/13/21______ @_______ , THIS TEST MUST BE DONE BEFORE SURGERY,  COVID TESTING SITE [place] [address], IT IS ON THE RIGHT GOING OUT [address] APPROXIMATELY  2 MINUTES PAST [employer] ON THE RIGHT. ONCE YOUR COVID TEST IS COMPLETED,  PLEASE BEGIN THE QUARANTINE INSTRUCTIONS AS OUTLINED IN YOUR HANDOUT.                Kelly Mooney  07/25/2020   Your procedure is scheduled on: 08/01/2020    Report to [facility] Main  Entrance   Report to admitting at    0830 AM     Call this number if you have problems the morning of surgery [phone]    Remember: Do  not eat food , candy gum or mints :After Midnight. You may have clear liquids from midnight until 0730am    CLEAR LIQUID DIET   Foods Allowed                                                                       Coffee and tea, regular and decaf                              Plain Jell-O any favor except red or purple                                            Fruit ices (not with fruit pulp)                                      Iced Popsicles                                     Carbonated beverages, regular and diet                                    Cranberry, grape and apple juices Sports drinks like Gatorade Lightly seasoned clear broth or consume(fat free) Sugar, honey syrup   _____________________________________________________________________    BRUSH YOUR TEETH MORNING OF SURGERY AND RINSE YOUR MOUTH OUT, NO CHEWING GUM CANDY OR MINTS.     Take these medicines the morning of surgery with A SIP OF WATER:  Inhalers as usual and bring,, amlodipine , synthroid, protonix   DO NOT TAKE ANY DIABETIC MEDICATIONS DAY OF YOUR SURGERY                               You may not have any metal on your body including hair pins and              piercings  Do not wear jewelry, make-up, lotions, powders or perfumes, deodorant             Do not wear nail  polish on your fingernails.  Do not shave  48 hours prior to surgery.              Men may shave face and neck.   Do not bring valuables to the hospital. [place].  Contacts, dentures or bridgework may not be worn into surgery.  Leave suitcase in the car. After surgery it may be brought to your room.     Patients discharged the day of surgery will not be allowed to drive home. IF YOU ARE HAVING SURGERY AND GOING HOME THE SAME DAY, YOU MUST HAVE AN ADULT TO DRIVE YOU HOME AND BE WITH YOU FOR 24 HOURS. YOU MAY GO HOME BY TAXI OR UBER OR ORTHERWISE, BUT AN ADULT MUST ACCOMPANY YOU HOME AND  STAY WITH YOU FOR 24 HOURS.  Name and phone number of your driver:               Please read over the following fact sheets you were given: _____________________________________________________________________  [facility] - Preparing for Surgery Before surgery, you can play an important role.  Because skin is not sterile, your skin needs to be as free of germs as possible.  You can reduce the number of germs on your skin by washing with CHG (chlorahexidine gluconate) soap before surgery.  CHG is an antiseptic cleaner which kills germs and bonds with the skin to continue killing germs even after washing. Please DO NOT use if you have an allergy to CHG or antibacterial soaps.  If your skin becomes reddened/irritated stop using the CHG and inform your nurse when you arrive at Short Stay. Do not shave (including legs and underarms) for at least 48 hours prior to the first CHG shower.  You may shave your face/neck. Please follow these instructions carefully:  1.  Shower with CHG Soap the night before surgery and the  morning of Surgery.  2.  If you choose to wash your hair, wash your hair first as usual with your  normal  shampoo.  3.  After you shampoo, rinse your hair and body thoroughly to remove the  shampoo.                           4.  Use CHG as you would any other liquid soap.  You can apply chg directly  to the skin and wash                       Gently with a scrungie or clean washcloth.  5.  Apply the CHG Soap to your body ONLY FROM THE NECK DOWN.   Do not use on face/ open                           Wound or open sores. Avoid contact with eyes, ears mouth and genitals (private parts).                       Wash face,  Genitals (private parts) with your normal soap.             6.  Wash thoroughly, paying special attention to the area where your surgery  will be performed.  7.  Thoroughly rinse your body with warm water from the neck down.  8.  DO NOT shower/wash with your normal soap  after using and rinsing off  the CHG Soap.                9.  Pat yourself dry with a clean towel.            10.  Wear clean pajamas.            11.  Place clean sheets on your bed the night of your first shower and do not  sleep with pets. Day of Surgery : Do not apply any lotions/deodorants the morning of surgery.  Please wear clean clothes to the hospital/surgery center.  FAILURE TO FOLLOW THESE INSTRUCTIONS MAY RESULT IN THE CANCELLATION OF YOUR SURGERY PATIENT SIGNATURE_________________________________  NURSE SIGNATURE__________________________________  ________________________________________________________________________            DUE TO COVID-19 ONLY ONE VISITOR IS ALLOWED TO COME WITH YOU AND STAY IN THE WAITING ROOM ONLY DURING PRE OP AND PROCEDURE DAY OF SURGERY. THE 1 VISITOR  MAY VISIT WITH YOU AFTER SURGERY IN YOUR PRIVATE ROOM DURING VISITING HOURS ONLY!  YOU NEED TO HAVE A COVID 19 TEST ON_______ @_______ , THIS TEST MUST BE DONE BEFORE SURGERY,  COVID TESTING SITE [address] [place] [address], IT IS ON THE RIGHT GOING OUT [address] APPROXIMATELY  2 MINUTES PAST [employer] ON THE RIGHT. ONCE YOUR COVID TEST IS COMPLETED,  PLEASE BEGIN THE QUARANTINE INSTRUCTIONS AS OUTLINED IN YOUR HANDOUT.                Kelly Mooney  07/25/2020   Your procedure is scheduled on:    Report to [facility] Main  Entrance   Report to admitting at AM     Call this number if you have problems the morning of surgery [phone]    Remember: Do not eat food , candy gum or mints :After Midnight. You may have clear liquids from midnight until     CLEAR LIQUID DIET   Foods Allowed                                                                       Coffee and tea, regular and decaf                              Plain Jell-O any favor except red or purple                                            Fruit ices (not with fruit pulp)                                       Iced Popsicles                                     Carbonated beverages, regular and diet  Cranberry, grape and apple juices Sports drinks like Gatorade Lightly seasoned clear broth or consume(fat free) Sugar, honey syrup   _____________________________________________________________________    BRUSH YOUR TEETH MORNING OF SURGERY AND RINSE YOUR MOUTH OUT, NO CHEWING GUM CANDY OR MINTS.     Take these medicines the morning of surgery with A SIP OF WATER:   DO NOT TAKE ANY DIABETIC MEDICATIONS DAY OF YOUR SURGERY                               You may not have any metal on your body including hair pins and              piercings  Do not wear jewelry, make-up, lotions, powders or perfumes, deodorant             Do not wear nail polish on your fingernails.  Do not shave  48 hours prior to surgery.              Men may shave face and neck.   Do not bring valuables to the hospital. [place].  Contacts, dentures or bridgework may not be worn into surgery.  Leave suitcase in the car. After surgery it may be brought to your room.     Patients discharged the day of surgery will not be allowed to drive home. IF YOU ARE HAVING SURGERY AND GOING HOME THE SAME DAY, YOU MUST HAVE AN ADULT TO DRIVE YOU HOME AND BE WITH YOU FOR 24 HOURS. YOU MAY GO HOME BY TAXI OR UBER OR ORTHERWISE, BUT AN ADULT MUST ACCOMPANY YOU HOME AND STAY WITH YOU FOR 24 HOURS.  Name and phone number of your driver:  Special Instructions: N/A              Please read over the following fact sheets you were given: _____________________________________________________________________  [facility] - Preparing for Surgery Before surgery, you can play an important role.  Because skin is not sterile, your skin needs to be as free of germs as possible.  You can reduce the number of germs on your skin by washing with CHG  (chlorahexidine gluconate) soap before surgery.  CHG is an antiseptic cleaner which kills germs and bonds with the skin to continue killing germs even after washing. Please DO NOT use if you have an allergy to CHG or antibacterial soaps.  If your skin becomes reddened/irritated stop using the CHG and inform your nurse when you arrive at Short Stay. Do not shave (including legs and underarms) for at least 48 hours prior to the first CHG shower.  You may shave your face/neck. Please follow these instructions carefully:  1.  Shower with CHG Soap the night before surgery and the  morning of Surgery.  2.  If you choose to wash your hair, wash your hair first as usual with your  normal  shampoo.  3.  After you shampoo, rinse your hair and body thoroughly to remove the  shampoo.                           4.  Use CHG as you would any other liquid soap.  You can apply chg directly  to the skin and wash  Gently with a scrungie or clean washcloth.  5.  Apply the CHG Soap to your body ONLY FROM THE NECK DOWN.   Do not use on face/ open                           Wound or open sores. Avoid contact with eyes, ears mouth and genitals (private parts).                       Wash face,  Genitals (private parts) with your normal soap.             6.  Wash thoroughly, paying special attention to the area where your surgery  will be performed.  7.  Thoroughly rinse your body with warm water from the neck down.  8.  DO NOT shower/wash with your normal soap after using and rinsing off  the CHG Soap.                9.  Pat yourself dry with a clean towel.            10.  Wear clean pajamas.            11.  Place clean sheets on your bed the night of your first shower and do not  sleep with pets. Day of Surgery : Do not apply any lotions/deodorants the morning of surgery.  Please wear clean clothes to the hospital/surgery center.  FAILURE TO FOLLOW THESE INSTRUCTIONS MAY RESULT IN THE CANCELLATION OF  YOUR SURGERY PATIENT SIGNATURE_________________________________  NURSE SIGNATURE__________________________________  ________________________________________________________________________

## 2020-07-25 NOTE — H&P (Signed)
Kelly Mooney DOB: 04-20-1946 Widowed / Language: Kelly Mooney / Race: White Female   Patient Care Team: Lawerance Cruel, MD as PCP - General (Family Medicine) Michael Boston, MD as Consulting Physician (General Surgery) Irene Shipper, MD as Consulting Physician (Gastroenterology)   History of Present Illness The patient is a 74 year old female who presents with anal pain.   Chief Complaint: Anal pain. Probable hemorrhoids. ` ` The patient is a woman that has struggled with anorectal issues. Her bowels 7 times a day. Uses antidiarrheals almost on a daily basis. History of colon polyps in the past. Intermittently seen by lumbar gastroenterology as well as [facility] gastroenterology. Last endoscopy Dr. Oletta Lamas in 2014. Most recently seen by Dr. Henrene Pastor 2017 overdue for f/u coloscopy 2020- held off a COVID. She was hospitalized with Covid pneumonitis herself was discharged in late November 2020. She had some TIAs and was followed by neurology. Was in coagulate and Plavix but no longer on that. 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 She has required some occasional dilatations by [facility]. She feels like her dysphagias come back.  She had many stroke and then Covid which is isolated her routine follow-up for the past few years. She is trying to reestablish care. She denies any rectal bleeding or melena. She takes Imodium a few times a week. No fiber supplement. Notes described the use of a proton pump inhibitor and bile absorbers such as Questran. She does not recall being on those and certainly is not on it right now. No personal nor family history of GI/colon cancer, inflammatory bowel disease, allergy such as Celiac Sprue, dietary/dairy problems, colitis, ulcers nor gastritis. No recent sick contacts/gastroenteritis. No travel outside the country. No changes in diet. No hematochezia, hematemesis, coffee ground emesis. No evidence  of prior gastric/peptic ulceration.  (Review of systems as stated in this history (HPI) or in the review of systems. Otherwise all other 12 point ROS are negative) ` ` `  This patient encounter took 40 minutes today to perform the following: obtain history, perform exam, review outside records, interpret tests & imaging, counsel the patient on their diagnosis; and, document this encounter, including findings & plan in the electronic health record (EHR).   Past Surgical History Emeline Gins, [place]; 03/11/2020 2:33 PM) Cataract Surgery Bilateral. Colon Polyp Removal - Colonoscopy Foot Surgery Right. Gallbladder Surgery - Open Hysterectomy (not due to cancer) - Partial  Diagnostic Studies History Emeline Gins, [place]; 03/11/2020 2:33 PM) Colonoscopy 5-10 years ago Mammogram 1-3 years ago Pap Smear 1-5 years ago  Allergies Emeline Gins, CMA; 03/11/2020 2:35 PM) Iodine (Antiseptic) *ANTISEPTICS & DISINFECTANTS* Iodinated Contrast Media Allergies Reconciled  Medication History Emeline Gins, CMA; 03/11/2020 2:37 PM) Fluocinonide (0.05% Cream, External) Active. Glimepiride (4MG  Tablet, Oral) Active. Levothyroxine Sodium (137MCG Tablet, Oral) Active. amLODIPine Besylate (10MG  Tablet, Oral) Active. Atorvastatin Calcium (20MG  Tablet, Oral) Active. Chlorthalidone (25MG  Tablet, Oral) Active. Clobetasol Propionate (0.05% Cream, External) Active. Escitalopram Oxalate (5MG  Tablet, Oral) Active. Fenofibrate (160MG  Tablet, Oral) Active. Fluticasone-Salmeterol (115-21MCG/ACT Aerosol, Inhalation) Active. metFORMIN HCl ER (500MG  Tablet ER 24HR, Oral) Active. Quinapril HCl (40MG  Tablet, Oral) Active. Pantoprazole Sodium (40MG  Tablet DR, Oral) Active. valACYclovir HCl (1GM Tablet, Oral) Active. Medications Reconciled  Social History Emeline Gins, [place]; 03/11/2020 2:33 PM) Alcohol use Occasional alcohol use. Caffeine use Carbonated beverages,  Coffee, Tea. No drug use Tobacco use Former smoker.  Family History Emeline Gins, [place]; 03/11/2020 2:33 PM) Anesthetic complications Brother, Mother, Sister. Diabetes Mellitus Brother. Heart Disease  Brother. Heart disease in female family member before age 24 Hypertension Brother. Kidney Disease Brother. Prostate Cancer Brother.  Pregnancy / Birth History Emeline Gins, [place]; 03/11/2020 2:33 PM) Age at menarche 12 years. Age of menopause 20-50 Contraceptive History Intrauterine device. Gravida 2 Maternal age 54-20 Para 2 Regular periods  Other Problems Emeline Gins, [place]; 03/11/2020 2:33 PM) Arthritis Asthma Back Pain Bladder Problems Cerebrovascular Accident Diabetes Mellitus Gastroesophageal Reflux Disease General anesthesia - complications Hemorrhoids High blood pressure Hypercholesterolemia Other disease, cancer, significant illness Thyroid Disease     Review of Systems Emeline Gins CMA; 03/11/2020 2:33 PM) General Present- Fatigue. Not Present- Appetite Loss, Chills, Fever, Night Sweats, Weight Gain and Weight Loss. Skin Not Present- Change in Wart/Mole, Dryness, Hives, Jaundice, New Lesions, Non-Healing Wounds, Rash and Ulcer. HEENT Present- Seasonal Allergies. Not Present- Earache, Hearing Loss, Hoarseness, Nose Bleed, Oral Ulcers, Ringing in the Ears, Sinus Pain, Sore Throat, Visual Disturbances, Wears glasses/contact lenses and Yellow Eyes. Respiratory Present- Difficulty Breathing. Not Present- Bloody sputum, Chronic Cough, Snoring and Wheezing. Breast Not Present- Breast Mass, Breast Pain, Nipple Discharge and Skin Changes. Cardiovascular Present- Shortness of Breath. Not Present- Chest Pain, Difficulty Breathing Lying Down, Leg Cramps, Palpitations, Rapid Heart Rate and Swelling of Extremities. Gastrointestinal Present- Bloating, Chronic diarrhea, Difficulty Swallowing, Gets full quickly at meals, Hemorrhoids,  Indigestion and Rectal Pain. Not Present- Abdominal Pain, Bloody Stool, Change in Bowel Habits, Constipation, Excessive gas, Nausea and Vomiting. Female Genitourinary Not Present- Frequency, Nocturia, Painful Urination, Pelvic Pain and Urgency. Musculoskeletal Present- Back Pain and Joint Stiffness. Not Present- Joint Pain, Muscle Pain, Muscle Weakness and Swelling of Extremities. Neurological Not Present- Decreased Memory, Fainting, Headaches, Numbness, Seizures, Tingling, Tremor, Trouble walking and Weakness. Psychiatric Not Present- Anxiety, Bipolar, Change in Sleep Pattern, Depression, Fearful and Frequent crying. Endocrine Not Present- Cold Intolerance, Excessive Hunger, Hair Changes, Heat Intolerance, Hot flashes and New Diabetes. Hematology Present- Easy Bruising. Not Present- Blood Thinners, Excessive bleeding, Gland problems, HIV and Persistent Infections.  Vitals Emeline Gins CMA; 03/11/2020 2:34 PM) 03/11/2020 2:33 PM Weight: 155.6 lb Height: 61in Body Surface Area: 1.7 m Body Mass Index: 29.4 kg/m  Temp.: 98.47F  Pulse: 89 (Regular)  BP: 134/68(Sitting, Left Arm, Standard)        Physical Exam [name] MD; 03/11/2020 3:23 PM)  General Mental Status-Alert. General Appearance-Not in acute distress, Not Sickly. Orientation-Oriented X3. Hydration-Well hydrated. Voice-Normal.  Integumentary Global Assessment Upon inspection and palpation of skin surfaces of the - Axillae: non-tender, no inflammation or ulceration, no drainage. and Distribution of scalp and body hair is normal. General Characteristics Temperature - normal warmth is noted.  Head and Neck Head-normocephalic, atraumatic with no lesions or palpable masses. Face Global Assessment - atraumatic, no absence of expression. Neck Global Assessment - no abnormal movements, no bruit auscultated on the right, no bruit auscultated on the left, no decreased range of motion,  non-tender. Trachea-midline. Thyroid Gland Characteristics - non-tender.  Eye Eyeball - Left-Extraocular movements intact, No Nystagmus - Left. Eyeball - Right-Extraocular movements intact, No Nystagmus - Right. Cornea - Left-No Hazy - Left. Cornea - Right-No Hazy - Right. Sclera/Conjunctiva - Left-No scleral icterus, No Discharge - Left. Sclera/Conjunctiva - Right-No scleral icterus, No Discharge - Right. Pupil - Left-Direct reaction to light normal. Pupil - Right-Direct reaction to light normal.  ENMT Ears Pinna - Left - no drainage observed, no generalized tenderness observed. Pinna - Right - no drainage observed, no generalized tenderness observed. Nose and Sinuses External Inspection of the Nose - no  destructive lesion observed. Inspection of the nares - Left - quiet respiration. Inspection of the nares - Right - quiet respiration. Mouth and Throat Lips - Upper Lip - no fissures observed, no pallor noted. Lower Lip - no fissures observed, no pallor noted. Nasopharynx - no discharge present. Oral Cavity/Oropharynx - Tongue - no dryness observed. Oral Mucosa - no cyanosis observed. Hypopharynx - no evidence of airway distress observed.  Chest and Lung Exam Inspection Movements - Normal and Symmetrical. Accessory muscles - No use of accessory muscles in breathing. Palpation Palpation of the chest reveals - Non-tender. Auscultation Breath sounds - Normal and Clear.  Cardiovascular Auscultation Rhythm - Regular. Murmurs & Other Heart Sounds - Auscultation of the heart reveals - No Murmurs and No Systolic Clicks.  Abdomen Inspection Inspection of the abdomen reveals - No Visible peristalsis and No Abnormal pulsations. Umbilicus - No Bleeding, No Urine drainage. Palpation/Percussion Palpation and Percussion of the abdomen reveal - Soft, Non Tender, No Rebound tenderness, No Rigidity (guarding) and No Cutaneous hyperesthesia. Note: Abdomen soft. Not  severely distended. No distasis recti. No umbilical or other anterior abdominal wall hernias  Female Genitourinary Sexual Maturity Tanner 5 - Adult hair pattern. Note: No vaginal bleeding nor discharge  Rectal Note: Obvious left lateral partially prolapsed grade 4 hemorrhoid. Sensitive. No thrombosis or ischemia. Some perianal moisture. No major pruritus.  Perianal skin clean with good hygiene. No pilonidal disease. No fissure. No abscess/fistula. Normal sphincter tone. No condyloma warts.  Rather sensitive but tolerates digital and anoscopic rectal exam. Large right posterior grade 3 hemorrhoid. Right anterior at least grade 2, possibly grade 3. Somewhat redundant floppy rectum but no true circumferential proccidentia prolapse. Exam done with assistance of female PA in the room.  Peripheral Vascular Upper Extremity Inspection - Left - No Cyanotic nailbeds - Left, Not Ischemic. Inspection - Right - No Cyanotic nailbeds - Right, Not Ischemic.  Neurologic Neurologic evaluation reveals -normal attention span and ability to concentrate, able to name objects and repeat phrases. Appropriate fund of knowledge , normal sensation and normal coordination. Mental Status Affect - not angry, not paranoid. Cranial Nerves-Normal Bilaterally. Gait-Normal.  Neuropsychiatric Mental status exam performed with findings of-able to articulate well with normal speech/language, rate, volume and coherence, thought content normal with ability to perform basic computations and apply abstract reasoning and no evidence of hallucinations, delusions, obsessions or homicidal/suicidal ideation.  Musculoskeletal Global Assessment Spine, Ribs and Pelvis - no instability, subluxation or laxity. Right Upper Extremity - no instability, subluxation or laxity.  Lymphatic Head & Neck  General Head & Neck Lymphatics: Bilateral - Description - No Localized  lymphadenopathy. Axillary  General Axillary Region: Bilateral - Description - No Localized lymphadenopathy. Femoral & Inguinal  Generalized Femoral & Inguinal Lymphatics: Left - Description - No Localized lymphadenopathy. Right - Description - No Localized lymphadenopathy.   Results [name] MD; 03/11/2020 3:24 PM) Procedures  Name Value Date ANOSCOPY, DIAGNOSTIC [phone]) [ Hemorrhoids ] Procedure Anal exam: prolapse Internal exam: Internal Hemorroids ( non-bleeding) prolapse Other: Obvious left lateral partially prolapsed grade 4 hemorrhoid. Sensitive. No thrombosis or ischemia. Some perianal moisture. No major pruritus............Marland KitchenPerianal skin clean with good hygiene. No pilonidal disease. No fissure. No abscess/fistula. Normal sphincter tone. No condyloma warts............Marland KitchenRather sensitive but tolerates digital and anoscopic rectal exam. Large right posterior grade 3 hemorrhoid. Right anterior at least grade 2, possibly grade 3. Somewhat redundant floppy rectum but no true circumferential proccidentia prolapse. Exam done with assistance of female PA in the room.  Performed:  03/11/2020 3:24 PM    Assessment & Plan [name] MD; 03/11/2020 3:23 PM)  PROLAPSED INTERNAL HEMORRHOIDS, GRADE 4 (K64.3) Impression: Worsening hemorrhoids in the setting of irritable bowel or diarrhea. Left lateral chronically prolapsed. Right posterior at least grade 3. I think the anatomy is too stretched out for banding alone to work. 2 sensitive anyway.  Think she would benefit from hemorrhoidal ligation/pexy. Hemorrhoidectomy of at least left lateral right posterior piles. Outpatient surgery. She had Covid last fall and is better now. We will work to coronary convenient time.  I noted that her hemorrhoids or a side effect of her diarrhea. She needs to slow down her bowels more aggressively. I recommend she do the loperamide at least twice a day.  Gradually increase until she's going less than 3 times a day. At a low-dose fiber supplement such as flaxseed her MiraLAX to thicken up her stools little bit. Thicken him up and slow him down. I strongly recommend she follow up with gastroenterology to see if there is further workup or treatment options for her chronic diarrhea.  Patient notes she feels like she is having worsening dysphagia with intermittent vomiting. Known esophageal stricture with dilatations done in the past. She thinks she is overdue for EGD.   PROLAPSED INTERNAL HEMORRHOIDS, GRADE 3 (K64.2) Impression: No personal nor family history of GI/colon cancer, inflammatory bowel disease, irritable bowel syndrome, allergy such as Celiac Sprue, dietary/dairy problems, colitis, ulcers nor gastritis. No recent sick contacts/gastroenteritis. No travel outside the country. No changes in diet. No dysphagia to solids or liquids. No significant heartburn or reflux. No hematochezia, hematemesis, coffee ground emesis. No evidence of prior gastric/peptic ulceration.   Pt Education - [facility] Hemorrhoids ([place]): discussed with patient and provided information. The anatomy & physiology of the anorectal region was discussed. The pathophysiology of hemorrhoids and differential diagnosis was discussed. Natural history risks without surgery was discussed. I stressed the importance of a bowel regimen to have daily soft bowel movements to minimize progression of disease. Interventions such as sclerotherapy & banding were discussed.  The patient's symptoms are not adequately controlled by medicines and other non-operative treatments. I feel the risks & problems of no surgery outweigh the operative risks; therefore, I recommended surgery to treat the hemorrhoids by ligation, pexy, and possible resection.  Risks such as bleeding, infection, urinary difficulties, need for further treatment, heart attack, death, and other risks were discussed. I noted  a good likelihood this will help address the problem. Goals of post-operative recovery were discussed as well. Possibility that this will not correct all symptoms was explained. Post-operative pain, bleeding, constipation, and other problems after surgery were discussed. We will work to minimize complications. Educational handouts further explaining the pathology, treatment options, and bowel regimen were given as well. Questions were answered. The patient expresses understanding & wishes to proceed with surgery.  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 She had colonoscopy 2014 negative for any small bowel or colonic mucosal deficiency such as infection or inflammatory bowel. We'll defer back to GI for further workup.  Current Plans Pt Education - [facility] IBS patient info: discussed with patient and provided information. Pt Education - [facility] Good Bowel Health (Kerby Borner)   [name], MD, FACS, MASCRS Gastrointestinal and Minimally Invasive Surgery  [facility] [address], [place],  [address] [phone] Main / Paging [phone] Fax

## 2020-07-26 ENCOUNTER — Encounter (HOSPITAL_COMMUNITY)
Admission: RE | Admit: 2020-07-26 | Discharge: 2020-07-26 | Disposition: A | Discharge status: Home / Self Care | Payer: Medicare Other | Source: Ambulatory Visit | Attending: Surgery | Admitting: Surgery

## 2020-07-26 ENCOUNTER — Encounter (HOSPITAL_COMMUNITY): Payer: Self-pay

## 2020-07-26 ENCOUNTER — Other Ambulatory Visit: Payer: Self-pay

## 2020-07-26 DIAGNOSIS — Z01812 Encounter for preprocedural laboratory examination: Secondary | ICD-10-CM | POA: Diagnosis not present

## 2020-07-26 HISTORY — DX: Family history of other specified conditions: Z84.89

## 2020-07-26 HISTORY — DX: Dyspnea, unspecified: R06.00

## 2020-07-26 HISTORY — DX: Other complications of anesthesia, initial encounter: T88.59XA

## 2020-07-26 HISTORY — DX: Nausea with vomiting, unspecified: R11.2

## 2020-07-26 HISTORY — DX: Personal history of urinary calculi: Z87.442

## 2020-07-26 HISTORY — DX: Other specified postprocedural states: Z98.890

## 2020-07-26 LAB — BASIC METABOLIC PANEL
Anion gap: 10 (ref 5–15)
BUN: 10 mg/dL (ref 8–23)
CO2: 24 mmol/L (ref 22–32)
Calcium: 9.2 mg/dL (ref 8.9–10.3)
Chloride: 107 mmol/L (ref 98–111)
Creatinine, Ser: 0.79 mg/dL (ref 0.44–1.00)
GFR calc Af Amer: >60 mL/min (ref >60)
GFR calc non Af Amer: >60 mL/min (ref >60)
Glucose, Bld: 239 mg/dL — ABNORMAL HIGH (ref 70–99)
Potassium: 4.4 mmol/L (ref 3.5–5.1)
Sodium: 141 mmol/L (ref 135–145)

## 2020-07-26 LAB — HEMOGLOBIN A1C
Hgb A1c MFr Bld: 6.3 % — ABNORMAL HIGH (ref 4.8–5.6)
Mean Plasma Glucose: 134.11 mg/dL

## 2020-07-26 LAB — CBC
HCT: 37.9 % (ref 36.0–46.0)
Hemoglobin: 12.6 g/dL (ref 12.0–15.0)
MCH: 29.1 pg (ref 26.0–34.0)
MCHC: 33.2 g/dL (ref 30.0–36.0)
MCV: 87.5 fL (ref 80.0–100.0)
Platelets: 156 10*3/uL (ref 150–400)
RBC: 4.33 MIL/uL (ref 3.87–5.11)
RDW: 12.9 % (ref 11.5–15.5)
WBC: 7 10*3/uL (ref 4.0–10.5)
nRBC: 0 % (ref 0.0–0.2)

## 2020-07-26 NOTE — Progress Notes (Signed)
Anesthesia Review:  PCP: DR Melinda Crutch  Cardiologist : Chest x-ray :10/02/2019  EKG :10/02/2019  Echo : Stress test: Cardiac Cath :  Activity level: can do a flight of stairs with some SOB  Sleep Study/ CPAP :no  DM-  Fasting Blood Sugar :      / Checks Blood Sugar -- times a day:   Blood Thinner/ Instructions /Last Dose: ASA / Instructions/ Last Dose :  DM type 2 - hgba1c done 07/26/20- epic  Hx of stroke- no deficits not followed by neurology per pt

## 2020-07-29 ENCOUNTER — Other Ambulatory Visit (HOSPITAL_COMMUNITY)
Admission: RE | Admit: 2020-07-29 | Discharge: 2020-07-29 | Disposition: A | Discharge status: Home / Self Care | Payer: Medicare Other | Source: Ambulatory Visit | Attending: Surgery | Admitting: Surgery

## 2020-07-29 DIAGNOSIS — Z01812 Encounter for preprocedural laboratory examination: Secondary | ICD-10-CM | POA: Diagnosis present

## 2020-07-29 DIAGNOSIS — Z20822 Contact with and (suspected) exposure to covid-19: Secondary | ICD-10-CM | POA: Insufficient documentation

## 2020-07-29 LAB — SARS CORONAVIRUS 2 (TAT 6-24 HRS): SARS Coronavirus 2: NEGATIVE

## 2020-07-31 MED ORDER — BUPIVACAINE LIPOSOME 1.3 % IJ SUSP
20.0000 mL | Freq: Once | INTRAMUSCULAR | Status: DC
  Filled 2020-07-31: qty 20

## 2020-08-01 ENCOUNTER — Ambulatory Visit (HOSPITAL_COMMUNITY): Payer: Medicare Other | Admitting: Physician Assistant

## 2020-08-01 ENCOUNTER — Encounter (HOSPITAL_COMMUNITY)
Admission: RE | Disposition: A | Discharge status: Home / Self Care | Payer: Self-pay | Source: Home / Self Care | Attending: Surgery

## 2020-08-01 ENCOUNTER — Ambulatory Visit (HOSPITAL_COMMUNITY): Payer: Medicare Other | Admitting: Certified Registered Nurse Anesthetist

## 2020-08-01 ENCOUNTER — Encounter (HOSPITAL_COMMUNITY): Payer: Self-pay | Admitting: Surgery

## 2020-08-01 ENCOUNTER — Ambulatory Visit (HOSPITAL_COMMUNITY)
Admission: RE | Admit: 2020-08-01 | Discharge: 2020-08-01 | Disposition: A | Discharge status: Home / Self Care | Payer: Medicare Other | Attending: Surgery | Admitting: Surgery

## 2020-08-01 DIAGNOSIS — Z79899 Other long term (current) drug therapy: Secondary | ICD-10-CM | POA: Diagnosis not present

## 2020-08-01 DIAGNOSIS — K219 Gastro-esophageal reflux disease without esophagitis: Secondary | ICD-10-CM | POA: Diagnosis not present

## 2020-08-01 DIAGNOSIS — Z8616 Personal history of COVID-19: Secondary | ICD-10-CM | POA: Insufficient documentation

## 2020-08-01 DIAGNOSIS — M1990 Unspecified osteoarthritis, unspecified site: Secondary | ICD-10-CM | POA: Diagnosis not present

## 2020-08-01 DIAGNOSIS — E7800 Pure hypercholesterolemia, unspecified: Secondary | ICD-10-CM | POA: Diagnosis not present

## 2020-08-01 DIAGNOSIS — Z888 Allergy status to other drugs, medicaments and biological substances status: Secondary | ICD-10-CM | POA: Insufficient documentation

## 2020-08-01 DIAGNOSIS — Z7984 Long term (current) use of oral hypoglycemic drugs: Secondary | ICD-10-CM | POA: Diagnosis not present

## 2020-08-01 DIAGNOSIS — Z7951 Long term (current) use of inhaled steroids: Secondary | ICD-10-CM | POA: Diagnosis not present

## 2020-08-01 DIAGNOSIS — Z79890 Hormone replacement therapy (postmenopausal): Secondary | ICD-10-CM | POA: Insufficient documentation

## 2020-08-01 DIAGNOSIS — E039 Hypothyroidism, unspecified: Secondary | ICD-10-CM | POA: Insufficient documentation

## 2020-08-01 DIAGNOSIS — Z8673 Personal history of transient ischemic attack (TIA), and cerebral infarction without residual deficits: Secondary | ICD-10-CM | POA: Insufficient documentation

## 2020-08-01 DIAGNOSIS — Z882 Allergy status to sulfonamides status: Secondary | ICD-10-CM | POA: Insufficient documentation

## 2020-08-01 DIAGNOSIS — K644 Residual hemorrhoidal skin tags: Secondary | ICD-10-CM | POA: Insufficient documentation

## 2020-08-01 DIAGNOSIS — K643 Fourth degree hemorrhoids: Secondary | ICD-10-CM | POA: Diagnosis not present

## 2020-08-01 DIAGNOSIS — J449 Chronic obstructive pulmonary disease, unspecified: Secondary | ICD-10-CM | POA: Diagnosis not present

## 2020-08-01 DIAGNOSIS — Z87891 Personal history of nicotine dependence: Secondary | ICD-10-CM | POA: Diagnosis not present

## 2020-08-01 DIAGNOSIS — K648 Other hemorrhoids: Secondary | ICD-10-CM | POA: Diagnosis present

## 2020-08-01 DIAGNOSIS — I10 Essential (primary) hypertension: Secondary | ICD-10-CM | POA: Diagnosis not present

## 2020-08-01 DIAGNOSIS — E119 Type 2 diabetes mellitus without complications: Secondary | ICD-10-CM | POA: Insufficient documentation

## 2020-08-01 HISTORY — PX: HEMORRHOID SURGERY: SHX153

## 2020-08-01 LAB — GLUCOSE, CAPILLARY
Glucose-Capillary: 137 mg/dL — ABNORMAL HIGH (ref 70–99)
Glucose-Capillary: 134 mg/dL — ABNORMAL HIGH (ref 70–99)

## 2020-08-01 SURGERY — HEMORRHOIDECTOMY
Anesthesia: General | Site: Rectum

## 2020-08-01 MED ORDER — ONDANSETRON HCL 4 MG/2ML IJ SOLN
INTRAMUSCULAR | Status: AC
  Filled 2020-08-01: qty 2

## 2020-08-01 MED ORDER — FENTANYL CITRATE (PF) 100 MCG/2ML IJ SOLN
INTRAMUSCULAR | Status: AC
  Filled 2020-08-01: qty 2

## 2020-08-01 MED ORDER — ONDANSETRON HCL 4 MG/2ML IJ SOLN: 4.0000 mg | Freq: Once | INTRAMUSCULAR | Status: DC | PRN

## 2020-08-01 MED ORDER — BUPIVACAINE-EPINEPHRINE 0.25% -1:200000 IJ SOLN
INTRAMUSCULAR | Status: DC | PRN
  Administered 2020-08-01: 20 mL

## 2020-08-01 MED ORDER — CHLORHEXIDINE GLUCONATE CLOTH 2 % EX PADS: 6.0000 | MEDICATED_PAD | Freq: Once | CUTANEOUS | Status: DC

## 2020-08-01 MED ORDER — GABAPENTIN 300 MG PO CAPS
300.0000 mg | ORAL_CAPSULE | ORAL | Status: AC
  Administered 2020-08-01: 300 mg via ORAL
  Filled 2020-08-01: qty 1

## 2020-08-01 MED ORDER — PHENYLEPHRINE 40 MCG/ML (10ML) SYRINGE FOR IV PUSH (FOR BLOOD PRESSURE SUPPORT)
PREFILLED_SYRINGE | INTRAVENOUS | Status: DC | PRN
  Administered 2020-08-01 (×2): 80 ug via INTRAVENOUS

## 2020-08-01 MED ORDER — LACTATED RINGERS IV SOLN: INTRAVENOUS | Status: DC

## 2020-08-01 MED ORDER — CHLORHEXIDINE GLUCONATE 0.12 % MT SOLN
15.0000 mL | Freq: Once | OROMUCOSAL | Status: AC
  Administered 2020-08-01: 15 mL via OROMUCOSAL

## 2020-08-01 MED ORDER — FENTANYL CITRATE (PF) 100 MCG/2ML IJ SOLN: 25.0000 ug | INTRAMUSCULAR | Status: DC | PRN

## 2020-08-01 MED ORDER — DEXAMETHASONE SODIUM PHOSPHATE 10 MG/ML IJ SOLN
INTRAMUSCULAR | Status: AC
  Filled 2020-08-01: qty 1

## 2020-08-01 MED ORDER — ONDANSETRON HCL 4 MG/2ML IJ SOLN
INTRAMUSCULAR | Status: DC | PRN
  Administered 2020-08-01: 4 mg via INTRAVENOUS

## 2020-08-01 MED ORDER — BUPIVACAINE-EPINEPHRINE (PF) 0.25% -1:200000 IJ SOLN
INTRAMUSCULAR | Status: AC
  Filled 2020-08-01: qty 30

## 2020-08-01 MED ORDER — ORAL CARE MOUTH RINSE: 15.0000 mL | Freq: Once | OROMUCOSAL | Status: AC

## 2020-08-01 MED ORDER — PROPOFOL 10 MG/ML IV BOLUS
INTRAVENOUS | Status: DC | PRN
  Administered 2020-08-01: 150 mg via INTRAVENOUS

## 2020-08-01 MED ORDER — LIDOCAINE 2% (20 MG/ML) 5 ML SYRINGE
INTRAMUSCULAR | Status: AC
  Filled 2020-08-01: qty 5

## 2020-08-01 MED ORDER — SODIUM CHLORIDE 0.9 % IV SOLN
2.0000 g | INTRAVENOUS | Status: AC
  Administered 2020-08-01: 2 g via INTRAVENOUS
  Filled 2020-08-01: qty 20

## 2020-08-01 MED ORDER — DIBUCAINE (PERIANAL) 1 % EX OINT
TOPICAL_OINTMENT | CUTANEOUS | Status: AC
  Filled 2020-08-01: qty 28

## 2020-08-01 MED ORDER — ROCURONIUM BROMIDE 10 MG/ML (PF) SYRINGE
PREFILLED_SYRINGE | INTRAVENOUS | Status: AC
  Filled 2020-08-01: qty 10

## 2020-08-01 MED ORDER — PROPOFOL 10 MG/ML IV BOLUS
INTRAVENOUS | Status: AC
  Filled 2020-08-01: qty 20

## 2020-08-01 MED ORDER — FENTANYL CITRATE (PF) 100 MCG/2ML IJ SOLN
INTRAMUSCULAR | Status: DC | PRN
Start: 2020-08-01 — End: 2020-08-01
  Administered 2020-08-01 (×4): 50 ug via INTRAVENOUS

## 2020-08-01 MED ORDER — LIDOCAINE 2% (20 MG/ML) 5 ML SYRINGE
INTRAMUSCULAR | Status: DC | PRN
  Administered 2020-08-01: 60 mg via INTRAVENOUS

## 2020-08-01 MED ORDER — ENSURE PRE-SURGERY PO LIQD
296.0000 mL | Freq: Once | ORAL | Status: DC
  Filled 2020-08-01: qty 296

## 2020-08-01 MED ORDER — ACETAMINOPHEN 500 MG PO TABS
1000.0000 mg | ORAL_TABLET | ORAL | Status: AC
  Administered 2020-08-01: 1000 mg via ORAL
  Filled 2020-08-01: qty 2

## 2020-08-01 MED ORDER — KETOROLAC TROMETHAMINE 15 MG/ML IJ SOLN
INTRAMUSCULAR | Status: DC | PRN
  Administered 2020-08-01: 7.5 mg via INTRAVENOUS

## 2020-08-01 MED ORDER — 0.9 % SODIUM CHLORIDE (POUR BTL) OPTIME
TOPICAL | Status: DC | PRN
  Administered 2020-08-01: 1000 mL

## 2020-08-01 MED ORDER — SUGAMMADEX SODIUM 200 MG/2ML IV SOLN
INTRAVENOUS | Status: DC | PRN
  Administered 2020-08-01: 200 mg via INTRAVENOUS

## 2020-08-01 MED ORDER — OXYCODONE HCL 5 MG PO TABS: 5.0000 mg | ORAL_TABLET | Freq: Four times a day (QID) | ORAL | 0 refills | Status: DC | PRN

## 2020-08-01 MED ORDER — DEXAMETHASONE SODIUM PHOSPHATE 10 MG/ML IJ SOLN
INTRAMUSCULAR | Status: DC | PRN
  Administered 2020-08-01: 5 mg via INTRAVENOUS

## 2020-08-01 MED ORDER — ROCURONIUM BROMIDE 10 MG/ML (PF) SYRINGE
PREFILLED_SYRINGE | INTRAVENOUS | Status: DC | PRN
  Administered 2020-08-01: 70 mg via INTRAVENOUS

## 2020-08-01 MED ORDER — METRONIDAZOLE IN NACL 5-0.79 MG/ML-% IV SOLN
500.0000 mg | INTRAVENOUS | Status: AC
  Administered 2020-08-01: 500 mg via INTRAVENOUS
  Filled 2020-08-01: qty 100

## 2020-08-01 MED ORDER — BUPIVACAINE LIPOSOME 1.3 % IJ SUSP
INTRAMUSCULAR | Status: DC | PRN
  Administered 2020-08-01: 20 mL

## 2020-08-01 SURGICAL SUPPLY — 51 items
APL SKNCLS STERI-STRIP NONHPOA (GAUZE/BANDAGES/DRESSINGS) ×1
BENZOIN TINCTURE PRP APPL 2/3 (GAUZE/BANDAGES/DRESSINGS) ×2 IMPLANT
BLADE HEX COATED 2.75 (ELECTRODE) ×2 IMPLANT
BLADE SURG 15 STRL LF DISP TIS (BLADE) ×1 IMPLANT
BLADE SURG 15 STRL SS (BLADE) ×2
BLADE SURG SZ10 CARB STEEL (BLADE) ×2 IMPLANT
BRIEF STRETCH FOR OB PAD LRG (UNDERPADS AND DIAPERS) ×2 IMPLANT
COVER BACK TABLE 60X90IN (DRAPES) ×2 IMPLANT
COVER MAYO STAND STRL (DRAPES) ×2 IMPLANT
COVER WAND RF STERILE (DRAPES) ×2 IMPLANT
DECANTER SPIKE VIAL GLASS SM (MISCELLANEOUS) ×2 IMPLANT
DRAPE HYSTEROSCOPY (DRAPE) IMPLANT
DRAPE LAPAROTOMY T 102X78X121 (DRAPES) ×2 IMPLANT
DRAPE SHEET LG 3/4 BI-LAMINATE (DRAPES) IMPLANT
DRSG PAD ABDOMINAL 8X10 ST (GAUZE/BANDAGES/DRESSINGS) ×2 IMPLANT
ELECT NEEDLE TIP 2.8 STRL (NEEDLE) IMPLANT
ELECT REM PT RETURN 15FT ADLT (MISCELLANEOUS) ×2 IMPLANT
FILTER STRAW (MISCELLANEOUS) ×2 IMPLANT
GAUZE SPONGE 4X4 12PLY STRL (GAUZE/BANDAGES/DRESSINGS) ×2 IMPLANT
GLOVE ECLIPSE 8.0 STRL XLNG CF (GLOVE) ×2 IMPLANT
GLOVE INDICATOR 8.0 STRL GRN (GLOVE) ×2 IMPLANT
GOWN STRL REUS W/TWL XL LVL3 (GOWN DISPOSABLE) ×2 IMPLANT
IV CATH 14GX2 1/4 (CATHETERS) ×2 IMPLANT
KIT BASIN OR (CUSTOM PROCEDURE TRAY) ×2 IMPLANT
KIT SIGMOIDOSCOPE (SET/KITS/TRAYS/PACK) IMPLANT
KIT TURNOVER CYSTO (KITS) ×2 IMPLANT
LEGGING LITHOTOMY PAIR STRL (DRAPES) IMPLANT
NEEDLE HYPO 22GX1.5 SAFETY (NEEDLE) ×2 IMPLANT
PAD PREP 24X48 CUFFED NSTRL (MISCELLANEOUS) IMPLANT
PENCIL SMOKE EVACUATOR (MISCELLANEOUS) ×2 IMPLANT
SCRUB TECHNI CARE 4 OZ NO DYE (MISCELLANEOUS) ×2 IMPLANT
SHEARS HARMONIC 9CM CVD (BLADE) IMPLANT
SURGILUBE 2OZ TUBE FLIPTOP (MISCELLANEOUS) ×2 IMPLANT
SUT CHROMIC 2 0 SH (SUTURE) IMPLANT
SUT CHROMIC 3 0 SH 27 (SUTURE) IMPLANT
SUT VIC AB 2-0 SH 27 (SUTURE)
SUT VIC AB 2-0 SH 27XBRD (SUTURE) IMPLANT
SUT VIC AB 2-0 UR6 27 (SUTURE) IMPLANT
SUT VICRYL 0 UR6 27IN ABS (SUTURE) IMPLANT
SUT VICRYL AB 2 0 TIE (SUTURE) IMPLANT
SUT VICRYL AB 2 0 TIES (SUTURE)
SYR 20ML LL LF (SYRINGE) ×2 IMPLANT
SYR 27GX1/2 1ML LL SAFETY (SYRINGE) ×2 IMPLANT
SYR BULB IRRIG 60ML STRL (SYRINGE) ×2 IMPLANT
SYR CONTROL 10ML LL (SYRINGE) IMPLANT
TAPE CLOTH 4X10 WHT NS (GAUZE/BANDAGES/DRESSINGS) ×2 IMPLANT
TOWEL OR 17X26 10 PK STRL BLUE (TOWEL DISPOSABLE) ×2 IMPLANT
TUBING CONNECTING 10 (TUBING) ×2 IMPLANT
UNDERPAD 30X36 HEAVY ABSORB (UNDERPADS AND DIAPERS) ×2 IMPLANT
WATER STERILE IRR 500ML POUR (IV SOLUTION) ×2 IMPLANT
YANKAUER SUCT BULB TIP NO VENT (SUCTIONS) ×2 IMPLANT

## 2020-08-01 NOTE — Anesthesia Preprocedure Evaluation (Addendum)
Anesthesia Evaluation  Patient identified by MRN, date of birth, ID band  Reviewed: Allergy & Precautions, NPO status , Patient's Chart, lab work & pertinent test results  History of Anesthesia Complications (+) PONV  Airway Mallampati: III  TM Distance: >3 FB Neck ROM: Full    Dental no notable dental hx.    Pulmonary asthma , COPD, former smoker,  H/o COVID in 09/2019   Pulmonary exam normal breath sounds clear to auscultation       Cardiovascular hypertension,  Rhythm:Regular Rate:Normal  02-Oct-2019 14:58:15 [facility]-HPED ROUTINE RECORD Sinus rhythm Low voltage, precordial leads   Neuro/Psych TIACVA    GI/Hepatic hiatal hernia, GERD  ,hemorrhoids   Endo/Other  diabetesHypothyroidism   Renal/GU   negative genitourinary   Musculoskeletal  (+) Arthritis ,   Abdominal   Peds  Hematology negative hematology ROS (+)   Anesthesia Other Findings   Reproductive/Obstetrics negative OB ROS                            Anesthesia Physical Anesthesia Plan  ASA: III  Anesthesia Plan: General   Post-op Pain Management:    Induction:   PONV Risk Score and Plan: 3 and Ondansetron, Dexamethasone, Treatment may vary due to age or medical condition and Propofol infusion  Airway Management Planned: Oral ETT  Additional Equipment:   Intra-op Plan:   Post-operative Plan: Extubation in OR  Informed Consent: I have reviewed the patients History and Physical, chart, labs and discussed the procedure including the risks, benefits and alternatives for the proposed anesthesia with the patient or authorized representative who has indicated his/her understanding and acceptance.     Dental advisory given  Plan Discussed with: CRNA and Anesthesiologist  Anesthesia Plan Comments: (Prolonged nausea x 3 days many years ago after anesthesia. Is unsure of which procedure it was. )        Anesthesia Quick Evaluation

## 2020-08-01 NOTE — Anesthesia Procedure Notes (Signed)
Procedure Name: Intubation Date/Time: 08/01/2020 10:48 AM Performed by: West Pugh, CRNA Pre-anesthesia Checklist: Patient identified, Emergency Drugs available, Suction available, Patient being monitored and Timeout performed Patient Re-evaluated:Patient Re-evaluated prior to induction Oxygen Delivery Method: Circle system utilized Preoxygenation: Pre-oxygenation with 100% oxygen Induction Type: IV induction Ventilation: Mask ventilation without difficulty and Oral airway inserted - appropriate to patient size Laryngoscope Size: 3 and Glidescope Grade View: Grade I Tube type: Oral Tube size: 7.0 mm Number of attempts: 1 Airway Equipment and Method: Video-laryngoscopy and Rigid stylet Placement Confirmation: ETT inserted through vocal cords under direct vision,  positive ETCO2,  CO2 detector and breath sounds checked- equal and bilateral Secured at: 21 cm Tube secured with: Tape Dental Injury: Teeth and Oropharynx as per pre-operative assessment  Comments: Elective glidescope intubation due to stiff and painful neck. AOI. Head, neck and spine maintained.

## 2020-08-01 NOTE — Op Note (Addendum)
08/01/2020  12:22 PM  PATIENT:  Kelly Mooney  74 y.o. female  Patient Care Team: Lawerance Cruel, MD as PCP - General (Family Medicine) Michael Boston, MD as Consulting Physician (General Surgery) Irene Shipper, MD as Consulting Physician (Gastroenterology)  PRE-OPERATIVE DIAGNOSIS:  HEMORRHOIDS PROLAPSED GRADE 4 WITH BLEEDING AND WITH PAIN  POST-OPERATIVE DIAGNOSIS:  HEMORRHOIDS PROLAPSED GRADE 4 WITH BLEEDING AND WITH PAIN  PROCEDURE:  Procedure(s): HEMORRHOIDECTOMY, HEMORRHOIDAL LIGATION /PEXY ANORECTAL EXAMINATION UNDER ANESTHESIA  Internal and external hemorrhoidectomy x2 Internal hemorrhoidal ligation and pexy Anorectal examination under anesthesia  SURGEON:  [name], MD  Daiva Huge, MD, PGY-7, [facility]  I was personally present during the key and critical portions of this procedure and immediately available throughout the entire procedure, as documented in my operative note.   ANESTHESIA:   General Anorectal & Local field block (0.25% bupivacaine with epinephrine mixed with Liposomal bupivacaine (Experel)   EBL:  Total I/O In: 1400 [I.V.:1200; IV Piggyback:200] Out: - .  See operative record  Delay start of Pharmacological VTE agent (>24hrs) due to surgical blood loss or risk of bleeding:  NO  DRAINS: NONE  SPECIMEN:   Internal & external hemorrhoid x2  DISPOSITION OF SPECIMEN:  PATHOLOGY  COUNTS:  YES  PLAN OF CARE: Discharge home after PACU  PATIENT DISPOSITION:  PACU - hemodynamically stable.  INDICATION: Pleasant patient with struggles with hemorrhoids.  Not able to be managed in the office despite an improved bowel regimen.  I recommended examination under anesthesia and surgical treatment:  The anatomy & physiology of the anorectal region was discussed.  The pathophysiology of hemorrhoids and differential diagnosis was discussed.  Natural history risks without surgery was discussed.   I stressed the importance of a bowel  regimen to have daily soft bowel movements to minimize progression of disease.  Interventions such as sclerotherapy & banding were discussed.  The patient's symptoms are not adequately controlled by medicines and other non-operative treatments.  I feel the risks & problems of no surgery outweigh the operative risks; therefore, I recommended surgery to treat the hemorrhoids by ligation, pexy, and possible resection.  Risks such as bleeding, infection, need for further treatment, heart attack, death, and other risks were discussed.   I noted a good likelihood this will help address the problem.  Goals of post-operative recovery were discussed as well.  Possibility that this will not correct all symptoms was explained.  Post-operative pain, bleeding, constipation, urinary difficulties, and other problems after surgery were discussed.  We will work to minimize complications.   Educational handouts further explaining the pathology, treatment options, and bowel regimen were given as well.  Questions were answered.  The patient expresses understanding & wishes to proceed with surgery.  OR FINDINGS: Grade 4 right anterior internal/external hemorrhoid.  Left lateral grade 3.  Right posterior grade 2.  Hemorrhoidal ligation pexy done times all 6 columns.  Removal of right anterior and left lateral hemorrhoid piles for redundancy.  No fissure no fistula no abscess.  Somewhat boggy and redundant rectum but no true circumferential mucosal prolapse.  Intact sphincter tone.  DESCRIPTION:   Informed consent was confirmed. Patient underwent general anesthesia without difficulty. Patient was placed into prone positioning.  The perianal region was prepped and draped in sterile fashion. Surgical time-out confirmed our plan.  I did digital rectal examination and then transitioned over to anoscopy to get a sense of the anatomy.  Findings noted above.   I proceeded to do hemorrhoidal ligation  and pexy.  I used a 2-0 Vicryl  suture on a UR-6 needle in a figure-of-eight fashion 6 cm proximal to the anal verge.  I started at the largest hemorrhoid pile.  Because of redundant hemorrhoidal tissue too bulky to merely ligate or pexy, I excised the excess internal hemorrhoid piles longitudinally in a fusiform biconcave fashion, sparing the anal canal to avoid narrowing.  I then ran that stitch longitudinally more distally to close the hemorrhoidectomy wound to the anal verge over a large Parks self retaining retractor to avoid narrowing of the anal canal.  I then tied that stitch down to cause a hemorrhoidopexy.   I also had to do an excision at the  right anterior and left lateral pile locations.  I then did hemorrhoidal ligation and pexy at the other 4 hemorrhoidal columns.  At the completion of this, all 6 anorectal columns were ligated and pexied in the classic hexagonal fashion (right anterior/lateral/posterior, left anterior/lateral/posterior).  I closed the external part of the hemorrhoidectomy wounds with interrupted horizontal mattress 2-0 chromic suture, leaving the last 5 mm open to allow natural drainage.    I redid anoscopy & examination.  At completion of this, all hemorrhoids had been removed or reduced into the rectum.  There is no more prolapse.  Internal & external anatomy was more more normal.  Hemostasis was good.  Fluffed gauze was on-laid over the perianal region.  No packing done.  Patient is being extubated go to go to the recovery room.  I had discussed postop care in detail with the patient in the preop holding area.  Instructions for post-operative recovery and prescriptions are written. I discussed operative findings, updated the patient's status, discussed probable steps to recovery, and gave postoperative recommendations to the patient's daughter, Kelly Mooney..  Recommendations were made.  Questions were answered.  She expressed understanding & appreciation.  [name], M.D., F.A.C.S. Gastrointestinal  and Minimally Invasive Surgery [facility] [address], [place], [place] [phone] Main / Paging

## 2020-08-01 NOTE — Interval H&P Note (Signed)
History and Physical Interval Note:  08/01/2020 10:05 AM  Kelly Mooney  has presented today for surgery, with the diagnosis of HEMORRHOIDS PROLAPSED GRADE 4 WITH BLEEDING AND WITH PAIN.  The various methods of treatment have been discussed with the patient and family. After consideration of risks, benefits and other options for treatment, the patient has consented to  Procedure(s) with comments: HEMORRHOIDECTOMY, [place] (N/A) - LOCAL as a surgical intervention.  The patient's history has been reviewed, patient examined, no change in status, stable for surgery.  I have reviewed the patient's chart and labs.  Questions were answered to the patient's satisfaction.    I have re-reviewed the the patient's records, history, medications, and allergies.  I have re-examined the patient.  I again discussed intraoperative plans and goals of post-operative recovery.  The patient agrees to proceed.  Kelly Mooney  [birth year] [id number]  Patient Care Team: Lawerance Cruel, MD as PCP - General (Family Medicine) Michael Boston, MD as Consulting Physician (General Surgery) Irene Shipper, MD as Consulting Physician (Gastroenterology)  Patient Active Problem List   Diagnosis Date Noted   COVID-19 virus infection 10/03/2019   Acute respiratory disease due to COVID-19 virus 10/03/2019   COPD (chronic obstructive pulmonary disease) ([place])    AKI (acute kidney injury) ([place])    GERD (gastroesophageal reflux disease)    Hypercholesteremia    TIA (transient ischemic attack) 07/05/2018   Type 2 diabetes mellitus without complication, with long-term current use of insulin ([place]) 07/05/2018   Hypothyroidism 07/05/2018   Essential hypertension 07/05/2018    Past Medical History:  Diagnosis Date   Arthritis    Asthma    since age 72   Cataract    Colon polyps    Complication of anesthesia    slow to wake up    COPD (chronic obstructive  pulmonary disease) ([place])    COVID-19 09/2019   Diabetes mellitus    Diverticulosis    Dyspnea    with exertoin due to asthma    Esophageal stricture    Family history of adverse reaction to anesthesia    mother , daughter and son slow to wake up and  problems with N/V    GERD (gastroesophageal reflux disease)    Hemorrhoids    Hiatal hernia    History of kidney stones    Hypercholesteremia    Hypertension    Hypothyroidism    IBS (irritable bowel syndrome)    Paraesophageal hernia    PONV (postoperative nausea and vomiting)    Stroke ([place])    no deficits    Tubular adenoma of colon     Past Surgical History:  Procedure Laterality Date   ABDOMINAL HYSTERECTOMY     BUNIONECTOMY Right 10/2012   CATARACT EXTRACTION, BILATERAL     CHOLECYSTECTOMY  1980   ESOPHAGEAL MANOMETRY N/A 07/15/2015   Procedure: ESOPHAGEAL MANOMETRY (EM);  Surgeon: Irene Shipper, MD;  Location: [facility];  Service: Endoscopy;  Laterality: N/A;   PARTIAL HYSTERECTOMY     RHINOPLASTY     nasal polyps removed dr. Barrie Folk    Social History   Socioeconomic History   Marital status: Widowed    Spouse name: Not on file   Number of children: 2   Years of education: 54   Highest education level: Not on file  Occupational History   Occupation: Retired  Tobacco Use   Smoking status: Former Smoker    Packs/day: 2.00  Years: 10.00    Pack years: 20.00    Quit date: 10/02/1996    Years since quitting: 23.8   Smokeless tobacco: Never Used   Tobacco comment: quit 1997  Vaping Use   Vaping Use: Never used  Substance and Sexual Activity   Alcohol use: No    Alcohol/week: 0.0 standard drinks   Drug use: No   Sexual activity: Not on file  Other Topics Concern   Not on file  Social History Narrative   Not on file   Social Determinants of Health   Financial Resource Strain:    Difficulty of Paying Living Expenses: Not on file  Food Insecurity:    Worried About [place] in the Last  Year: Not on file   Ran Out of Food in the Last Year: Not on file  Transportation Needs:    Lack of Transportation (Medical): Not on file   Lack of Transportation (Non-Medical): Not on file  Physical Activity:    Days of Exercise per Week: Not on file   Minutes of Exercise per Session: Not on file  Stress:    Feeling of Stress : Not on file  Social Connections:    Frequency of Communication with Friends and Family: Not on file   Frequency of Social Gatherings with Friends and Family: Not on file   Attends Religious Services: Not on file   Active Member of Clubs or Organizations: Not on file   Attends Archivist Meetings: Not on file   Marital Status: Not on file  Intimate Partner Violence:    Fear of Current or Ex-Partner: Not on file   Emotionally Abused: Not on file   Physically Abused: Not on file   Sexually Abused: Not on file    Family History  Problem Relation Age of Onset   Diabetes Mellitus I Mother    Hyperlipidemia Mother    Asthma Mother    Heart attack Father    Heart attack Brother    Asthma Brother    Heart attack Brother    Colon cancer Neg Hx    Colon polyps Neg Hx    Esophageal cancer Neg Hx    Rectal cancer Neg Hx    Stomach cancer Neg Hx     Medications Prior to Admission  Medication Sig Dispense Refill Last Dose   albuterol (PROVENTIL HFA;VENTOLIN HFA) 108 (90 BASE) MCG/ACT inhaler Inhale 2 puffs into the lungs 4 (four) times daily.    08/01/2020 at 0630   amLODipine (NORVASC) 10 MG tablet Take 10 mg by mouth daily with lunch.    08/01/2020 at 0630   atorvastatin (LIPITOR) 20 MG tablet Take 1 tablet (20 mg total) by mouth daily. 90 tablet 3 07/31/2020 at Unknown time   chlorthalidone (HYGROTON) 25 MG tablet Take 25 mg by mouth daily.    07/31/2020 at Unknown time   Cholecalciferol (VITAMIN D3 SUPER STRENGTH) 50 MCG (2000 UT) CAPS Take 2,000 Units by mouth daily. Gummie   07/31/2020 at Unknown time   clobetasol cream (TEMOVATE) 7.09 % Apply 1  application topically daily. Apply a thin layer to affected areas  4 Past Week at Unknown time   fenofibrate 160 MG tablet Take 160 mg by mouth daily.   07/31/2020 at Unknown time   fluocinonide (LIDEX) 0.05 % external solution Apply 1 application topically See admin instructions. Apply to affected areas daily as needed for itching   Past Week at Unknown time   fluticasone-salmeterol (ADVAIR HFA)  230-21 MCG/ACT inhaler Inhale 1 puff into the lungs 2 (two) times daily.    08/01/2020 at 0630   glimepiride (AMARYL) 4 MG tablet Take 4 mg by mouth daily.    07/31/2020 at Unknown time   levothyroxine (SYNTHROID, LEVOTHROID) 137 MCG tablet Take 137 mcg by mouth daily before breakfast.   08/01/2020 at 0630   metFORMIN (GLUCOPHAGE) 1000 MG tablet Take 1,000 mg by mouth 2 (two) times daily.   07/31/2020 at Unknown time   mometasone (ELOCON) 0.1 % cream Apply 1 application topically every other day.    Past Week at Unknown time   montelukast (SINGULAIR) 10 MG tablet Take 10 mg by mouth daily.   4 07/31/2020 at Unknown time   Multiple Vitamins-Minerals (MULTIVITAMIN WITH MINERALS) tablet Take 2 tablets by mouth daily. Gummie   Past Week at Unknown time   pantoprazole (PROTONIX) 40 MG tablet TAKE 1 TABLET (40 MG TOTAL) BY MOUTH DAILY. 30 tablet 3 07/31/2020 at Unknown time   quinapril (ACCUPRIL) 40 MG tablet Take 40 mg by mouth daily.    07/31/2020 at Unknown time   Soft Lens Products (B & L SENSITIVE EYES SALINE) 0.4 % SOLN Place 1 drop into both eyes daily as needed (Dry eye).   08/01/2020 at 0630   escitalopram (LEXAPRO) 5 MG tablet Take 5 mg by mouth daily. (Patient not taking: Reported on 05/23/2020)      topiramate (TOPAMAX) 50 MG tablet Take 1 tablet (50 mg total) by mouth daily. (Patient not taking: Reported on 05/23/2020) 30 tablet 6    valACYclovir (VALTREX) 1000 MG tablet Take 2 tablets by mouth as needed ([place]).    More than a month at Unknown time    Current Facility-Administered Medications  Medication  Dose Route Frequency Provider Last Rate Last Admin   bupivacaine liposome (EXPAREL) 1.3 % injection 266 mg  20 mL Infiltration Once Michael Boston, MD       cefTRIAXone (ROCEPHIN) 2 g in sodium chloride 0.9 % 100 mL IVPB  2 g Intravenous On Call to OR Michael Boston, MD       And   metroNIDAZOLE (FLAGYL) IVPB 500 mg  500 mg Intravenous On Call to OR Michael Boston, MD       Chlorhexidine Gluconate Cloth 2 % PADS 6 each  6 each Topical Once Michael Boston, MD       And   Chlorhexidine Gluconate Cloth 2 % PADS 6 each  6 each Topical Once Michael Boston, MD       [START ON 08/02/2020] feeding supplement (ENSURE PRE-SURGERY) liquid 296 mL  296 mL Oral Once Michael Boston, MD       lactated ringers infusion   Intravenous Continuous Duane Boston, MD 10 mL/hr at 08/01/20 0857 New Bag at 08/01/20 0857     Allergies  Allergen Reactions   Chloramphenicol Anaphylaxis   Ivp Dye [Iodinated Diagnostic Agents] Anaphylaxis, Shortness Of Breath and Other (See Comments)    SEVERE convulsions, too    Crestor [Rosuvastatin Calcium]     Muscle cramps   Iodine Itching   Sulfa Antibiotics Rash    BP 132/61   Pulse 80   Temp 98.3 F (36.8 C) (Oral)   Resp 16   Ht 5\' 1"  (1.549 m)   Wt 69.4 kg   SpO2 98%   BMI 28.91 kg/m   Labs: Results for orders placed or performed during the hospital encounter of 08/01/20 (from the past 48 hour(s))  Glucose, capillary  Status: Abnormal   Collection Time: 08/01/20  8:41 AM  Result Value Ref Range   Glucose-Capillary 137 (H) 70 - 99 mg/dL    Comment: Glucose reference range applies only to samples taken after fasting for at least 8 hours.    Imaging / Studies: No results found.   [name], M.D., F.A.C.S. Gastrointestinal and Minimally Invasive Surgery [facility] [address], [place], [place] [id number] [phone] Main / Paging  08/01/2020 10:05 AM    [name]

## 2020-08-01 NOTE — Anesthesia Postprocedure Evaluation (Addendum)
Anesthesia Post Note  Patient: [name]  Procedure(s) Performed: HEMORRHOIDECTOMY, HEMORRHOIDAL LIGATION Pollyann Savoy ANORECTAL EXAMINATION UNDER ANESTHESIA (N/A Rectum)     Patient location during evaluation: PACU Anesthesia Type: General Level of consciousness: awake and alert Pain management: pain level controlled Vital Signs Assessment: post-procedure vital signs reviewed and stable Respiratory status: spontaneous breathing, nonlabored ventilation, respiratory function stable and patient connected to nasal cannula oxygen Cardiovascular status: blood pressure returned to baseline and stable Postop Assessment: no apparent nausea or vomiting Anesthetic complications: no   No complications documented.  Last Vitals:  Vitals:   08/01/20 1300 08/01/20 1315  BP: 121/60 (!) 122/57  Pulse: 77 76  Resp: 12 10  Temp:    SpO2: 95% 95%    Last Pain:  Vitals:   08/01/20 1315  TempSrc:   PainSc: 0-No pain                 Kelly Mooney

## 2020-08-01 NOTE — Transfer of Care (Signed)
Immediate Anesthesia Transfer of Care Note  Patient: Kelly Mooney  Procedure(s) Performed: HEMORRHOIDECTOMY, HEMORRHOIDAL LIGATION Pollyann Savoy ANORECTAL EXAMINATION UNDER ANESTHESIA (N/A Rectum)  Patient Location: PACU  Anesthesia Type:General  Level of Consciousness: awake, drowsy and patient cooperative  Airway & Oxygen Therapy: Patient Spontanous Breathing and Patient connected to face mask oxygen  Post-op Assessment: Report given to RN and Post -op Vital signs reviewed and stable  Post vital signs: Reviewed and stable  Last Vitals:  Vitals Value Taken Time  BP 148/59 08/01/20 1211  Temp    Pulse 89 08/01/20 1213  Resp 11 08/01/20 1213  SpO2 100 % 08/01/20 1213  Vitals shown include unvalidated device data.  Last Pain:  Vitals:   08/01/20 0850  TempSrc:   PainSc: 8       Patients Stated Pain Goal: 7 (96/28/36 6294)  Complications: No complications documented.

## 2020-08-02 ENCOUNTER — Encounter (HOSPITAL_COMMUNITY): Payer: Self-pay | Admitting: Surgery

## 2020-08-02 LAB — SURGICAL PATHOLOGY

## 2021-01-08 ENCOUNTER — Telehealth: Payer: Self-pay | Admitting: Family Medicine

## 2021-01-08 NOTE — Telephone Encounter (Signed)
Call returned to patient, confirmed DOB. She reports her husband who is now deceased had to inogen chargers and batteries and she wanted to donate them to the clinic. I made her aware she could drop the items off at the front desk. Voiced understanding.   Nothing further needed at this time.

## 2021-03-05 ENCOUNTER — Ambulatory Visit (INDEPENDENT_AMBULATORY_CARE_PROVIDER_SITE_OTHER): Payer: Medicare Other | Admitting: Internal Medicine

## 2021-03-05 ENCOUNTER — Encounter: Payer: Self-pay | Admitting: Internal Medicine

## 2021-03-05 ENCOUNTER — Other Ambulatory Visit: Payer: Self-pay

## 2021-03-05 VITALS — BP 130/60 | HR 103 | Temp 97.7°F | Ht 61.0 in | Wt 152.8 lb

## 2021-03-05 DIAGNOSIS — J449 Chronic obstructive pulmonary disease, unspecified: Secondary | ICD-10-CM | POA: Diagnosis not present

## 2021-03-05 MED ORDER — TRELEGY ELLIPTA 200-62.5-25 MCG/INH IN AEPB: 1.0000 | INHALATION_SPRAY | Freq: Every day | RESPIRATORY_TRACT | 5 refills | Status: DC

## 2021-03-05 MED ORDER — TRELEGY ELLIPTA 100-62.5-25 MCG/INH IN AEPB: 1.0000 | INHALATION_SPRAY | Freq: Every day | RESPIRATORY_TRACT | 5 refills | Status: DC

## 2021-03-05 MED ORDER — TRELEGY ELLIPTA 200-62.5-25 MCG/INH IN AEPB: 200.0000 ug | INHALATION_SPRAY | Freq: Once | RESPIRATORY_TRACT | 0 refills | Status: AC

## 2021-03-05 NOTE — Patient Instructions (Signed)
The patient should have follow up scheduled with myself in 1 months.   Prior to next visit patient should have: Full set of PFTs - 1 hour, appointment after  Understanding COPD   What is COPD? COPD stands for chronic obstructive pulmonary (lung) disease. COPD is a general term used for several lung diseases.  COPD is an umbrella term and encompasses other  common diseases in this group like chronic bronchitis and emphysema. Chronic asthma may also be included in this group. While some patients with COPD have only chronic bronchitis or emphysema, most patients have a combination of both.  You might hear these terms used in exchange for one another.   COPD adds to the work of the heart. Diseased lungs may reduce the amount of oxygen that goes to the blood. High blood pressure in blood vessels from the heart to the lungs makes it difficult for the heart to pump. Lung disease can also cause the body to produce too many red blood cells which may make the blood thicker and harder to pump.   Patients who have COPD with low oxygen levels may develop an enlarged heart (cor pulmonale). This condition weakens the heart and causes increased shortness of breath and swelling in the legs and feet.   Chronic bronchitis Chronic bronchitis is irritation and inflammation (swelling) of the lining in the bronchial tubes (air passages). The irritation causes coughing and an excess amount of mucus in the airways. The swelling makes it difficult to get air in and out of the lungs. The small, hair-like structures on the inside of the airways (called cilia) may be damaged by the irritation. The cilia are then unable to help clean mucus from the airways.  Bronchitis is generally considered to be chronic when you have: a productive cough (cough up mucus) and shortness of breath that lasts about 3 months or more each year for 2 or more years in a row. Your doctor may define chronic bronchitis differently.    Emphysema Emphysema is the destruction, or breakdown, of the walls of the alveoli (air sacs) located at the end of the bronchial tubes. The damaged alveoli are not able to exchange oxygen and carbon dioxide between the lungs and the blood. The bronchioles lose their elasticity and collapse when you exhale, trapping air in the lungs. The trapped air keeps fresh air and oxygen from entering the lungs.   Who is affected by COPD? Emphysema and chronic bronchitis affect approximately 16 million people in the [place], or close to 11 percent of the population.   Symptoms of COPD   Shortness of breath   Shortness of breath with mild exercise (walking, using the stairs, etc.)   Chronic, productive cough (with mucus)   A feeling of "tightness" in the chest   Wheezing   What causes COPD? The two primary causes of COPD are cigarette smoking and alpha1-antitrypsin (AAT) deficiency. Air pollution and occupational dusts may also contribute to COPD, especially when the person exposed to these substances is a cigarette smoker.  Cigarette smoke causes COPD by irritating the airways and creating inflammation that narrows the airways, making it more difficult to breathe. Cigarette smoke also causes the cilia to stop working properly so mucus and trapped particles are not cleaned from the airways. As a result, chronic cough and excess mucus production develop, leading to chronic bronchitis.  In some people, chronic bronchitis and infections can lead to destruction of the small airways, or emphysema.  AAT deficiency, an inherited  disorder, can also lead to emphysema. Alpha antitrypsin (AAT) is a protective material produced in the liver and transported to the lungs to help combat inflammation. When there is not enough of the chemical AAT, the body is no longer protected from an enzyme in the white blood cells.   How is COPD diagnosed?  To diagnose COPD, the physician needs to know: . Do you smoke?   . Have you had chronic exposure to dust or air pollutants?  . Do other members of your family have lung disease?  Marland Kitchen Are you short of breath?  . Do you get short of breath with exercise?  Marland Kitchen Do you have chronic cough and/or wheezing?  Marland Kitchen Do you cough up excess mucus?  To help with the diagnosis, the physician will conduct a thorough physical exam which includes:  1. Listening to your lungs and heart  2. Checking your blood pressure and pulse  3. Examining your nose and throat  4. Checking your feet and ankles for swelling   Laboratory and other tests Several laboratory and other tests are needed to confirm a diagnosis of COPD. These tests may include:  . Chest X-ray to look for lung changes that could be caused by COPD  .  Spirometry and pulmonary function tests (PFTs) to determine lung volume and air flow  . Pulse oximetry to measure the saturation of oxygen in the blood  . Arterial blood gases (ABGs) to determine the amount of oxygen and carbon dioxide in the blood  . Exercise testing to determine if the oxygen level in the blood drops during exercise   Treatment In the beginning stages of COPD, there is minimal shortness of breath that may be noticed only during exercise. As the disease progresses, shortness of breath may worsen and you may need to wear an oxygen device.   To help control other symptoms of COPD, the following treatments and lifestyle changes may be prescribed.  . Quitting smoking  . Avoiding cigarette smoke and other irritants  . Taking medications including: a. bronchodilators b. anti-inflammatory agents c. oxygen d. antibiotics  . Maintaining a healthy diet  . Following a structured exercise program such as pulmonary rehabilitation . Preventing respiratory infections  . Controlling stress   If your COPD progresses, you may be eligible to be evaluated for lung volume reduction surgery or lung transplantation. You may also be eligible to participate in certain  clinical trials (research studies). Ask your health care providers about studies being conducted in your hospital.   What is the outlook? Although COPD can not be cured, its symptoms can be treated and your quality of life can be improved. Your prognosis or outlook for the future will depend on how well your lungs are functioning, your symptoms, and how well you respond to and follow your treatment plan.

## 2021-03-05 NOTE — Progress Notes (Signed)
Kelly Mooney    [id number]    Mar 16, 1946  Primary Care Physician:Ross, Dwyane Luo, MD  Referring Physician: Lawerance Cruel, MD [place],  [place] [id number] Reason for Consultation: shortness of breath Date of Consultation: 03/05/2021  Chief complaint:   Chief Complaint  Patient presents with  . Consult    Emphysema, asthma and COPD.SOB,     HPI: Kelly Mooney is a 75 y.o. woman with history of childhood asthma and tobacco use (former) who presents for new patient evaluation for worsening cough and shortness of breath. Denies signinicant wheezing, but does have chest tightness, relieved with albuterol.   She was hospitalized at [facility] for 5 days in 02-15-19 with covid 19 infection. She feels breathing has gotten worse since covid infection.   She has dyspnea with daily activities including carrying groceries.  She is on albuterol 3-4 times/day and takes advair twice a day. She also has a nebulizer machine  Her husband passed away in 02/14/18 from pulmonary fibrosis.   She has received prednisone in the past and it helps her breathing a lot. Nothing recently.   Social history:  Occupation: Occupational hygienist, retired Exposures: lives at home alone  Social History   Occupational History  . Occupation: Retired  Tobacco Use  . Smoking status: Former Smoker    Packs/day: 1.50    Years: 10.00    Pack years: 15.00    Quit date: 10/02/1996    Years since quitting: 24.4  . Smokeless tobacco: Never Used  . Tobacco comment: quit 1997  Vaping Use  . Vaping Use: Never used  Substance and Sexual Activity  . Alcohol use: No    Alcohol/week: 0.0 standard drinks  . Drug use: No  . Sexual activity: Not on file    Relevant family history: Family History  Problem Relation Age of Onset  . Diabetes Mellitus I Mother   . Hyperlipidemia Mother   . Asthma Mother   . Heart attack Father   . Heart attack Brother   . Asthma Brother   . Heart attack Brother    . Colon cancer Neg Hx   . Colon polyps Neg Hx   . Esophageal cancer Neg Hx   . Rectal cancer Neg Hx   . Stomach cancer Neg Hx     Past Medical History:  Diagnosis Date  . Arthritis   . Asthma    since age 15  . Cataract   . Colon polyps   . Complication of anesthesia    slow to wake up   . COPD (chronic obstructive pulmonary disease) ([place])   . COVID-19 09/2019  . Diabetes mellitus   . Diverticulosis   . Dyspnea    with exertoin due to asthma   . Esophageal stricture   . Family history of adverse reaction to anesthesia    mother , daughter and son slow to wake up and  problems with N/V   . GERD (gastroesophageal reflux disease)   . Hemorrhoids   . Hiatal hernia   . History of kidney stones   . Hypercholesteremia   . Hypertension   . Hypothyroidism   . IBS (irritable bowel syndrome)   . Paraesophageal hernia   . PONV (postoperative nausea and vomiting)   . Stroke [facility])    no deficits   . Tubular adenoma of colon     Past Surgical History:  Procedure Laterality Date  . ABDOMINAL HYSTERECTOMY    .  BUNIONECTOMY Right 10/2012  . CATARACT EXTRACTION, BILATERAL    . CHOLECYSTECTOMY  1980  . ESOPHAGEAL MANOMETRY N/A 07/15/2015   Procedure: ESOPHAGEAL MANOMETRY (EM);  Surgeon: Irene Shipper, MD;  Location: [facility];  Service: Endoscopy;  Laterality: N/A;  . HEMORRHOID SURGERY N/A 08/01/2020   Procedure: HEMORRHOIDECTOMY, HEMORRHOIDAL LIGATION /PEXY ANORECTAL EXAMINATION UNDER ANESTHESIA;  Surgeon: Michael Boston, MD;  Location: [facility] ORS;  Service: General;  Laterality: N/A;  LOCAL  . PARTIAL HYSTERECTOMY    . RHINOPLASTY     nasal polyps removed dr. Barrie Folk     Physical Exam: Blood pressure 130/60, pulse (!) 103, temperature 97.7 F (36.5 C), temperature source Temporal, height 5\' 1"  (1.549 m), weight 152 lb 12.8 oz (69.3 kg), SpO2 97 %. Gen:      No acute distress ENT:  no nasal polyps, mucus membranes moist Lungs:    No increased respiratory effort, symmetric chest  wall excursion, clear to auscultation bilaterally, diminished, no wheezes or crackles CV:         Regular rate and rhythm; no murmurs, rubs, or gallops.  No pedal edema Abd:      + bowel sounds; soft, non-tender; no distension MSK: no acute synovitis of DIP or PIP joints, no mechanics hands.  Skin:      Warm and dry; no rashes Neuro: normal speech, no focal facial asymmetry Psych: alert and oriented x3, normal mood and affect   Data Reviewed/Medical Decision Making:  Independent interpretation of tests: Imaging: . Review of patient's chest xxray Nov 2020 images revealed mild hyperinflation and interstitial changes consistent with COPD. The patient's images have been independently reviewed by me.    PFTs: None on file  Labs:  Lab Results  Component Value Date   WBC 7.0 07/26/2020   HGB 12.6 07/26/2020   HCT 37.9 07/26/2020   MCV 87.5 07/26/2020   PLT 156 07/26/2020   Lab Results  Component Value Date   NA 141 07/26/2020   K 4.4 07/26/2020   CL 107 07/26/2020   CO2 24 07/26/2020     Immunization status:  Immunization History  Administered Date(s) Administered  . Influenza-Unspecified 08/16/2014, 08/19/2015  . PFIZER Comirnaty(Gray Top)Covid-19 Tri-Sucrose Vaccine 12/21/2019, 01/12/2020, 01/02/2021    . I reviewed prior external note(s) from hospital stay, pcp . I reviewed the result(s) of the labs and imaging as noted above.  . I have ordered PFT  Assessment:  COPD  Plan/Recommendations: Will obtain a full set of PFTs. Consider pulmonary rehab after PFTs.  Given her ongoing symptoms will up her treatment from advair to trelegy 200.  I will see her back after PFTs.   We discussed disease management and progression at length today regarding COPD.   Return to Care: Return in about 4 weeks (around 04/02/2021).  Lenice Llamas, MD Pulmonary and [place]  CC: Lawerance Cruel, MD

## 2021-04-15 ENCOUNTER — Ambulatory Visit: Payer: Medicare Other | Admitting: Internal Medicine

## 2021-04-18 ENCOUNTER — Other Ambulatory Visit (HOSPITAL_COMMUNITY)
Admission: RE | Admit: 2021-04-18 | Discharge: 2021-04-18 | Disposition: A | Discharge status: Home / Self Care | Payer: Medicare Other | Source: Ambulatory Visit | Attending: Internal Medicine | Admitting: Internal Medicine

## 2021-04-18 DIAGNOSIS — Z01812 Encounter for preprocedural laboratory examination: Secondary | ICD-10-CM | POA: Insufficient documentation

## 2021-04-18 DIAGNOSIS — Z20822 Contact with and (suspected) exposure to covid-19: Secondary | ICD-10-CM | POA: Insufficient documentation

## 2021-04-18 LAB — SARS CORONAVIRUS 2 (TAT 6-24 HRS): SARS Coronavirus 2: NEGATIVE

## 2021-04-21 ENCOUNTER — Ambulatory Visit (INDEPENDENT_AMBULATORY_CARE_PROVIDER_SITE_OTHER): Payer: Medicare Other | Admitting: Internal Medicine

## 2021-04-21 ENCOUNTER — Other Ambulatory Visit: Payer: Self-pay

## 2021-04-21 ENCOUNTER — Encounter: Payer: Self-pay | Admitting: Internal Medicine

## 2021-04-21 VITALS — BP 130/56 | HR 95 | Temp 98.6°F | Ht 62.0 in | Wt 157.2 lb

## 2021-04-21 DIAGNOSIS — J449 Chronic obstructive pulmonary disease, unspecified: Secondary | ICD-10-CM

## 2021-04-21 LAB — PULMONARY FUNCTION TEST
DL/VA % pred: 111 %
DL/VA: 4.64 ml/min/mmHg/L
DLCO cor % pred: 103 %
DLCO cor: 18.5 ml/min/mmHg
DLCO unc % pred: 103 %
DLCO unc: 18.5 ml/min/mmHg
FEF 25-75 Post: 0.79 L/sec
FEF 25-75 Pre: 0.53 L/sec
FEF2575-%Change-Post: 48 %
FEF2575-%Pred-Post: 51 %
FEF2575-%Pred-Pre: 34 %
FEV1-%Change-Post: 13 %
FEV1-%Pred-Post: 70 %
FEV1-%Pred-Pre: 62 %
FEV1-Post: 1.35 L
FEV1-Pre: 1.19 L
FEV1FVC-%Change-Post: 5 %
FEV1FVC-%Pred-Pre: 78 %
FEV6-%Change-Post: 8 %
FEV6-%Pred-Post: 88 %
FEV6-%Pred-Pre: 81 %
FEV6-Post: 2.16 L
FEV6-Pre: 1.98 L
FEV6FVC-%Change-Post: 1 %
FEV6FVC-%Pred-Post: 104 %
FEV6FVC-%Pred-Pre: 103 %
FVC-%Change-Post: 7 %
FVC-%Pred-Post: 84 %
FVC-%Pred-Pre: 78 %
FVC-Post: 2.18 L
FVC-Pre: 2.03 L
Post FEV1/FVC ratio: 62 %
Post FEV6/FVC ratio: 99 %
Pre FEV1/FVC ratio: 59 %
Pre FEV6/FVC Ratio: 98 %
RV % pred: 154 %
RV: 3.38 L
TLC % pred: 118 %
TLC: 5.64 L

## 2021-04-21 NOTE — Progress Notes (Signed)
Kelly Mooney    [id number]    1945-11-29  Primary Care Physician:Ross, Dwyane Luo, MD Date of Appointment: 04/21/2021 Established Patient Visit  Chief complaint:   Chief Complaint  Patient presents with  . Follow-up     HPI: Kelly Mooney is a 75 y.o. woman with COPD, quit smoking 25 years ago.   Interval Updates: Here for follow up today after PFTs. Started trelegy at last visit. Using albuterol once every other day. Feels much improved.  No thrush, gargling after use.    I have reviewed the patient's family social and past medical history and updated as appropriate.   Past Medical History:  Diagnosis Date  . Arthritis   . Asthma    since age 39  . Cataract   . Colon polyps   . Complication of anesthesia    slow to wake up   . COPD (chronic obstructive pulmonary disease) ([place])   . COVID-19 09/2019  . Diabetes mellitus   . Diverticulosis   . Dyspnea    with exertoin due to asthma   . Esophageal stricture   . Family history of adverse reaction to anesthesia    mother , daughter and son slow to wake up and  problems with N/V   . GERD (gastroesophageal reflux disease)   . Hemorrhoids   . Hiatal hernia   . History of kidney stones   . Hypercholesteremia   . Hypertension   . Hypothyroidism   . IBS (irritable bowel syndrome)   . Paraesophageal hernia   . PONV (postoperative nausea and vomiting)   . Stroke [facility])    no deficits   . Tubular adenoma of colon     Past Surgical History:  Procedure Laterality Date  . ABDOMINAL HYSTERECTOMY    . BUNIONECTOMY Right 10/2012  . CATARACT EXTRACTION, BILATERAL    . CHOLECYSTECTOMY  1980  . ESOPHAGEAL MANOMETRY N/A 07/15/2015   Procedure: ESOPHAGEAL MANOMETRY (EM);  Surgeon: Irene Shipper, MD;  Location: [facility];  Service: Endoscopy;  Laterality: N/A;  . HEMORRHOID SURGERY N/A 08/01/2020   Procedure: HEMORRHOIDECTOMY, HEMORRHOIDAL LIGATION /PEXY ANORECTAL EXAMINATION UNDER ANESTHESIA;  Surgeon:  Michael Boston, MD;  Location: [facility];  Service: General;  Laterality: N/A;  LOCAL  . PARTIAL HYSTERECTOMY    . RHINOPLASTY     nasal polyps removed dr. Barrie Folk    Family History  Problem Relation Age of Onset  . Diabetes Mellitus I Mother   . Hyperlipidemia Mother   . Asthma Mother   . Heart attack Father   . Heart attack Brother   . Asthma Brother   . Heart attack Brother   . Colon cancer Neg Hx   . Colon polyps Neg Hx   . Esophageal cancer Neg Hx   . Rectal cancer Neg Hx   . Stomach cancer Neg Hx     Social History   Occupational History  . Occupation: Retired  Tobacco Use  . Smoking status: Former Smoker    Packs/day: 1.50    Years: 10.00    Pack years: 15.00    Quit date: 10/02/1996    Years since quitting: 24.5  . Smokeless tobacco: Never Used  . Tobacco comment: quit 1997  Vaping Use  . Vaping Use: Never used  Substance and Sexual Activity  . Alcohol use: No    Alcohol/week: 0.0 standard drinks  . Drug use: No  . Sexual activity: Not on file  Physical Exam: Blood pressure (!) 130/56, pulse 95, temperature 98.6 F (37 C), temperature source Temporal, height 5\' 2"  (1.575 m), weight 157 lb 3.2 oz (71.3 kg), SpO2 98 %.  Gen:      No acute distress Lungs:    No increased respiratory effort, symmetric chest wall excursion, clear to auscultation bilaterally, no wheezes or crackles CV:         Regular rate and rhythm; no murmurs, rubs, or gallops.  No pedal edema   Data Reviewed: Imaging: I have personally reviewed the chest xray Nov 2020 with hyperinflation  PFTs:  PFT Results Latest Ref Rng & Units 04/21/2021  FVC-Pre L 2.03  FVC-Predicted Pre % 78  FVC-Post L 2.18  FVC-Predicted Post % 84  Pre FEV1/FVC % % 59  Post FEV1/FCV % % 62  FEV1-Pre L 1.19  FEV1-Predicted Pre % 62  FEV1-Post L 1.35  DLCO uncorrected ml/min/mmHg 18.50  DLCO UNC% % 103  DLCO corrected ml/min/mmHg 18.50  DLCO COR %Predicted % 103  DLVA Predicted % 111  TLC L 5.64  TLC  % Predicted % 118  RV % Predicted % 154   I have personally reviewed the patient's PFTs and moderately severe COPD without BD response. Lung volumes with hyperinflation and air trapping  Labs: Lab Results  Component Value Date   WBC 7.0 07/26/2020   HGB 12.6 07/26/2020   HCT 37.9 07/26/2020   MCV 87.5 07/26/2020   PLT 156 07/26/2020    Immunization status: Immunization History  Administered Date(s) Administered  . Influenza-Unspecified 08/16/2014, 08/19/2015  . PFIZER Comirnaty(Gray Top)Covid-19 Tri-Sucrose Vaccine 12/21/2019, 01/12/2020, 01/02/2021    Assessment:  COPD, Gold stage 2  Plan/Recommendations: Continue trelegy, prn albuterol Will refer [facility] Pulmonary [facility].  Encouraged her to exercise at gym near home.   Discussed vaccinations including influenza and pneumonia in the fall.   Return to Care: Return in about 6 months (around 10/21/2021).   Lenice Llamas, MD Pulmonary and [place]

## 2021-04-21 NOTE — Progress Notes (Signed)
PFT done today. 

## 2021-04-21 NOTE — Patient Instructions (Addendum)
The patient should have follow up scheduled with myself in 6 months.   I will refer you to pulmonary rehab for your breathing. No restrictions on working out at home - go as tolerated.   Continue trelegy once daily.  Start taking an antihistamine for your drainage at night time.   Take the albuterol rescue inhaler every 4 to 6 hours as needed for wheezing or shortness of breath. You can also take it 15 minutes before exercise or exertional activity. Side effects include heart racing or pounding, jitters or anxiety. If you have a history of an irregular heart rhythm, it can make this worse. Can also give some patients a hard time sleeping.

## 2021-04-23 ENCOUNTER — Encounter (HOSPITAL_COMMUNITY): Payer: Self-pay | Admitting: *Deleted

## 2021-04-23 NOTE — Progress Notes (Signed)
Received referral from Dr. [name] for this pt to participate in pulmonary rehab with the the diagnosis of COPD with Chronic Bronchitis and emphysema.  Pt with PFT on 04/21/21 showed FEV1/FVC 62 and FEV1 post predicted 70. Clinical review of pt follow up appt on 6/6 Pulmonary office note.  Pt with Covid Risk Score - 5. Pt appropriate for scheduling for Pulmonary rehab.  Will forward to support staff for scheduling when able as there is along wait list and verification of insurance eligibility/benefits with pt consent. Cherre Huger, BSN Cardiac and Training and development officer

## 2021-05-13 ENCOUNTER — Telehealth (HOSPITAL_COMMUNITY): Payer: Self-pay

## 2021-05-13 NOTE — Telephone Encounter (Signed)
Attempted to call patient in regards to Pulmonary Rehab - LM on VM Mailed letter 

## 2021-05-13 NOTE — Telephone Encounter (Signed)
Pt returned PR phone call and stated she is interested in PR. Explained scheduling process and went over insurance, patient verbalized understanding. Also adv pt where we are with scheduling for PR and that we have a back log. (1-3 months)

## 2021-05-13 NOTE — Telephone Encounter (Signed)
Pt insurance is active and benefits verified through [facility] [employer]. Co-pay $0.00, DED $200.00/$200.00 met, out of pocket $2,200.00/$200.00 met, co-insurance 0%. No pre-authorization required. Alura P./[employer], 05/13/21 @ 11:32AM, JOI#[id number]   Will contact patient to see if she is interested in the Pulmonary Rehab Program.

## 2021-06-13 ENCOUNTER — Telehealth (HOSPITAL_COMMUNITY): Payer: Self-pay

## 2021-06-13 NOTE — Telephone Encounter (Signed)
Called patient to see if she was interested in participating in the Pulmonary Rehab Program. Patient stated yes. Patient will come in for orientation on 07/04/21 @ 10:30AM and will attend the 1:15PM exercise class.   Tourist information centre manager.

## 2021-07-02 ENCOUNTER — Telehealth (HOSPITAL_COMMUNITY): Payer: Self-pay

## 2021-07-02 NOTE — Telephone Encounter (Signed)
Pt called and stated she is unable to participate in PR at this time due to her mother health issues. Will contact PR when she is ready/able to participate.   Close referral

## 2021-07-04 ENCOUNTER — Ambulatory Visit (HOSPITAL_COMMUNITY): Payer: Medicare Other

## 2021-07-08 ENCOUNTER — Ambulatory Visit (HOSPITAL_COMMUNITY): Payer: Medicare Other

## 2021-07-10 ENCOUNTER — Ambulatory Visit (HOSPITAL_COMMUNITY): Payer: Medicare Other

## 2021-07-15 ENCOUNTER — Ambulatory Visit (HOSPITAL_COMMUNITY): Payer: Medicare Other

## 2021-07-16 ENCOUNTER — Other Ambulatory Visit: Payer: Self-pay | Admitting: Family Medicine

## 2021-07-16 ENCOUNTER — Other Ambulatory Visit: Payer: Self-pay | Admitting: Obstetrics & Gynecology

## 2021-07-16 DIAGNOSIS — M810 Age-related osteoporosis without current pathological fracture: Secondary | ICD-10-CM

## 2021-07-16 DIAGNOSIS — Z1231 Encounter for screening mammogram for malignant neoplasm of breast: Secondary | ICD-10-CM

## 2021-07-17 ENCOUNTER — Ambulatory Visit (HOSPITAL_COMMUNITY): Payer: Medicare Other

## 2021-07-17 ENCOUNTER — Other Ambulatory Visit: Payer: Self-pay | Admitting: Obstetrics & Gynecology

## 2021-07-17 DIAGNOSIS — E2839 Other primary ovarian failure: Secondary | ICD-10-CM

## 2021-07-22 ENCOUNTER — Ambulatory Visit (HOSPITAL_COMMUNITY): Payer: Medicare Other

## 2021-07-24 ENCOUNTER — Ambulatory Visit (HOSPITAL_COMMUNITY): Payer: Medicare Other

## 2021-07-25 ENCOUNTER — Telehealth (HOSPITAL_COMMUNITY): Payer: Self-pay

## 2021-07-25 NOTE — Telephone Encounter (Signed)
Pt called back and stated that she would like to get back in for pulmonary rehab, I advised pt that we still have that 1-3 month backlog and that I would open her pulmonary rehab referral back up and will give her a call at a later date to her scheduled. Pt understood.

## 2021-07-29 ENCOUNTER — Ambulatory Visit (HOSPITAL_COMMUNITY): Payer: Medicare Other

## 2021-07-31 ENCOUNTER — Ambulatory Visit (HOSPITAL_COMMUNITY): Payer: Medicare Other

## 2021-08-05 ENCOUNTER — Ambulatory Visit (HOSPITAL_COMMUNITY): Payer: Medicare Other

## 2021-08-07 ENCOUNTER — Ambulatory Visit (HOSPITAL_COMMUNITY): Payer: Medicare Other

## 2021-08-12 ENCOUNTER — Ambulatory Visit (HOSPITAL_COMMUNITY): Payer: Medicare Other

## 2021-08-14 ENCOUNTER — Ambulatory Visit (HOSPITAL_COMMUNITY): Payer: Medicare Other

## 2021-08-19 ENCOUNTER — Ambulatory Visit (HOSPITAL_COMMUNITY): Payer: Medicare Other

## 2021-08-21 ENCOUNTER — Ambulatory Visit (HOSPITAL_COMMUNITY): Payer: Medicare Other

## 2021-08-26 ENCOUNTER — Telehealth: Payer: Self-pay | Admitting: Internal Medicine

## 2021-08-26 ENCOUNTER — Ambulatory Visit (HOSPITAL_COMMUNITY): Payer: Medicare Other

## 2021-08-26 MED ORDER — MOLNUPIRAVIR EUA 200MG CAPSULE: 4.0000 | ORAL_CAPSULE | Freq: Two times a day (BID) | ORAL | 0 refills | Status: AC

## 2021-08-26 NOTE — Telephone Encounter (Signed)
Called and spoke with pt and she stated that she tested positive for covid today.  She is having temp of 99.4, no energy, sore throat, headache, diarrhea, cough with thick yellow/green sputum.   She is calling to see if ND wanted to called something in for her. She stated that she has had diarrhea all night long and last went about 2 hours ago.    Allergies  Allergen Reactions   Chloramphenicol Anaphylaxis   Ivp Dye [Iodinated Diagnostic Agents] Anaphylaxis, Shortness Of Breath and Other (See Comments)    SEVERE convulsions, too    Crestor [Rosuvastatin Calcium]     Muscle cramps   Iodine Itching   Sulfa Antibiotics Rash

## 2021-08-26 NOTE — Telephone Encounter (Signed)
Spero Geralds, MD  Lbpu Triage Pool 14 minutes ago (2:33 PM)   Anti-viral called into pharmacy

## 2021-08-26 NOTE — Telephone Encounter (Signed)
Spoke with the pt and notified that antiviral sent to pharm  Nothing further needed

## 2021-08-28 ENCOUNTER — Ambulatory Visit (HOSPITAL_COMMUNITY): Payer: Medicare Other

## 2021-08-30 ENCOUNTER — Emergency Department (HOSPITAL_BASED_OUTPATIENT_CLINIC_OR_DEPARTMENT_OTHER)
Admission: EM | Admit: 2021-08-30 | Discharge: 2021-08-30 | Disposition: A | Discharge status: Home / Self Care | Payer: Medicare Other | Attending: Emergency Medicine | Admitting: Emergency Medicine

## 2021-08-30 ENCOUNTER — Other Ambulatory Visit: Payer: Self-pay

## 2021-08-30 ENCOUNTER — Encounter (HOSPITAL_BASED_OUTPATIENT_CLINIC_OR_DEPARTMENT_OTHER): Payer: Self-pay

## 2021-08-30 ENCOUNTER — Emergency Department (HOSPITAL_BASED_OUTPATIENT_CLINIC_OR_DEPARTMENT_OTHER): Payer: Medicare Other

## 2021-08-30 DIAGNOSIS — J449 Chronic obstructive pulmonary disease, unspecified: Secondary | ICD-10-CM | POA: Diagnosis not present

## 2021-08-30 DIAGNOSIS — Z7984 Long term (current) use of oral hypoglycemic drugs: Secondary | ICD-10-CM | POA: Insufficient documentation

## 2021-08-30 DIAGNOSIS — J45909 Unspecified asthma, uncomplicated: Secondary | ICD-10-CM | POA: Diagnosis not present

## 2021-08-30 DIAGNOSIS — Z87891 Personal history of nicotine dependence: Secondary | ICD-10-CM | POA: Insufficient documentation

## 2021-08-30 DIAGNOSIS — E039 Hypothyroidism, unspecified: Secondary | ICD-10-CM | POA: Insufficient documentation

## 2021-08-30 DIAGNOSIS — Z7951 Long term (current) use of inhaled steroids: Secondary | ICD-10-CM | POA: Diagnosis not present

## 2021-08-30 DIAGNOSIS — Z79899 Other long term (current) drug therapy: Secondary | ICD-10-CM | POA: Diagnosis not present

## 2021-08-30 DIAGNOSIS — Z8616 Personal history of COVID-19: Secondary | ICD-10-CM | POA: Insufficient documentation

## 2021-08-30 DIAGNOSIS — J181 Lobar pneumonia, unspecified organism: Secondary | ICD-10-CM | POA: Diagnosis not present

## 2021-08-30 DIAGNOSIS — E119 Type 2 diabetes mellitus without complications: Secondary | ICD-10-CM | POA: Diagnosis not present

## 2021-08-30 DIAGNOSIS — R0602 Shortness of breath: Secondary | ICD-10-CM | POA: Diagnosis present

## 2021-08-30 DIAGNOSIS — Z794 Long term (current) use of insulin: Secondary | ICD-10-CM | POA: Diagnosis not present

## 2021-08-30 DIAGNOSIS — J189 Pneumonia, unspecified organism: Secondary | ICD-10-CM

## 2021-08-30 DIAGNOSIS — I10 Essential (primary) hypertension: Secondary | ICD-10-CM | POA: Diagnosis not present

## 2021-08-30 DIAGNOSIS — Z20822 Contact with and (suspected) exposure to covid-19: Secondary | ICD-10-CM | POA: Insufficient documentation

## 2021-08-30 LAB — COMPREHENSIVE METABOLIC PANEL
ALT: 12 U/L (ref 0–44)
AST: 22 U/L (ref 15–41)
Albumin: 3.8 g/dL (ref 3.5–5.0)
Alkaline Phosphatase: 50 U/L (ref 38–126)
Anion gap: 11 (ref 5–15)
BUN: 21 mg/dL (ref 8–23)
CO2: 25 mmol/L (ref 22–32)
Calcium: 9.3 mg/dL (ref 8.9–10.3)
Chloride: 103 mmol/L (ref 98–111)
Creatinine, Ser: 1.24 mg/dL — ABNORMAL HIGH (ref 0.44–1.00)
GFR, Estimated: 45 mL/min — ABNORMAL LOW (ref >60)
Glucose, Bld: 167 mg/dL — ABNORMAL HIGH (ref 70–99)
Potassium: 3.8 mmol/L (ref 3.5–5.1)
Sodium: 139 mmol/L (ref 135–145)
Total Bilirubin: 0.8 mg/dL (ref 0.3–1.2)
Total Protein: 7.5 g/dL (ref 6.5–8.1)

## 2021-08-30 LAB — CBC WITH DIFFERENTIAL/PLATELET
Abs Immature Granulocytes: 0.03 10*3/uL (ref 0.00–0.07)
Basophils Absolute: 0.1 10*3/uL (ref 0.0–0.1)
Basophils Relative: 1 %
Eosinophils Absolute: 0.1 10*3/uL (ref 0.0–0.5)
Eosinophils Relative: 1 %
HCT: 38.1 % (ref 36.0–46.0)
Hemoglobin: 12.8 g/dL (ref 12.0–15.0)
Immature Granulocytes: 0 %
Lymphocytes Relative: 18 %
Lymphs Abs: 1.9 10*3/uL (ref 0.7–4.0)
MCH: 29 pg (ref 26.0–34.0)
MCHC: 33.6 g/dL (ref 30.0–36.0)
MCV: 86.2 fL (ref 80.0–100.0)
Monocytes Absolute: 0.8 10*3/uL (ref 0.1–1.0)
Monocytes Relative: 7 %
Neutro Abs: 7.7 10*3/uL (ref 1.7–7.7)
Neutrophils Relative %: 73 %
Platelets: 250 10*3/uL (ref 150–400)
RBC: 4.42 MIL/uL (ref 3.87–5.11)
RDW: 12.3 % (ref 11.5–15.5)
WBC: 10.6 10*3/uL — ABNORMAL HIGH (ref 4.0–10.5)
nRBC: 0 % (ref 0.0–0.2)

## 2021-08-30 LAB — RESP PANEL BY RT-PCR (FLU A&B, COVID) ARPGX2
Influenza A by PCR: NEGATIVE
Influenza B by PCR: NEGATIVE
SARS Coronavirus 2 by RT PCR: NEGATIVE

## 2021-08-30 MED ORDER — DOXYCYCLINE HYCLATE 100 MG PO CAPS: 100.0000 mg | ORAL_CAPSULE | Freq: Two times a day (BID) | ORAL | 0 refills | Status: DC

## 2021-08-30 MED ORDER — SODIUM CHLORIDE 0.9 % IV BOLUS
500.0000 mL | Freq: Once | INTRAVENOUS | Status: AC
  Administered 2021-08-30: 500 mL via INTRAVENOUS

## 2021-08-30 MED ORDER — SODIUM CHLORIDE 0.9 % IV SOLN
INTRAVENOUS | Status: DC | PRN
  Administered 2021-08-30: 1000 mL via INTRAVENOUS

## 2021-08-30 MED ORDER — SODIUM CHLORIDE 0.9 % IV SOLN
2.0000 g | Freq: Once | INTRAVENOUS | Status: AC
  Administered 2021-08-30: 2 g via INTRAVENOUS
  Filled 2021-08-30: qty 20

## 2021-08-30 NOTE — ED Provider Notes (Addendum)
[facility] DEPARTMENT Provider Note   CSN: [id number] Arrival date & time: 08/30/21  1022     History Chief Complaint  Patient presents with   Shortness of Breath    Kelly Mooney is a 75 y.o. female.  Pt reports she had a faint line on covid test that she thinks was positive  Pt has been on molnupivir and has had no relief  The history is provided by the patient. No language interpreter was used.  Shortness of Breath Severity:  Moderate Onset quality:  Gradual Duration:  5 days Timing:  Constant Progression:  Worsening Chronicity:  New Context: URI   Relieved by:  Nothing Worsened by:  Nothing Ineffective treatments:  None tried Associated symptoms: cough and wheezing       Past Medical History:  Diagnosis Date   Arthritis    Asthma    since age 21   Cataract    Colon polyps    Complication of anesthesia    slow to wake up    COPD (chronic obstructive pulmonary disease) ([place])    COVID-19 09/2019   Diabetes mellitus    Diverticulosis    Dyspnea    with exertoin due to asthma    Esophageal stricture    Family history of adverse reaction to anesthesia    mother , daughter and son slow to wake up and  problems with N/V    GERD (gastroesophageal reflux disease)    Hemorrhoids    Hiatal hernia    History of kidney stones    Hypercholesteremia    Hypertension    Hypothyroidism    IBS (irritable bowel syndrome)    Paraesophageal hernia    PONV (postoperative nausea and vomiting)    Stroke (HCC)    no deficits    Tubular adenoma of colon     Patient Active Problem List   Diagnosis Date Noted   COVID-19 virus infection 10/03/2019   Acute respiratory disease due to COVID-19 virus 10/03/2019   COPD (chronic obstructive pulmonary disease) ([place])    AKI (acute kidney injury) ([place])    GERD (gastroesophageal reflux disease)    Hypercholesteremia    TIA (transient ischemic attack) 07/05/2018   Type 2 diabetes mellitus without  complication, with long-term current use of insulin ([place]) 07/05/2018   Hypothyroidism 07/05/2018   Essential hypertension 07/05/2018    Past Surgical History:  Procedure Laterality Date   ABDOMINAL HYSTERECTOMY     BUNIONECTOMY Right 10/2012   CATARACT EXTRACTION, BILATERAL     CHOLECYSTECTOMY  1980   ESOPHAGEAL MANOMETRY N/A 07/15/2015   Procedure: ESOPHAGEAL MANOMETRY (EM);  Surgeon: Irene Shipper, MD;  Location: [facility];  Service: Endoscopy;  Laterality: N/A;   HEMORRHOID SURGERY N/A 08/01/2020   Procedure: HEMORRHOIDECTOMY, HEMORRHOIDAL LIGATION Pollyann Savoy ANORECTAL EXAMINATION UNDER ANESTHESIA;  Surgeon: Michael Boston, MD;  Location: [facility];  Service: General;  Laterality: N/A;  LOCAL   PARTIAL HYSTERECTOMY     RHINOPLASTY     nasal polyps removed dr. Barrie Folk     OB History   No obstetric history on file.     Family History  Problem Relation Age of Onset   Diabetes Mellitus I Mother    Hyperlipidemia Mother    Asthma Mother    Heart attack Father    Heart attack Brother    Asthma Brother    Heart attack Brother    Colon cancer Neg Hx    Colon polyps Neg Hx    Esophageal  cancer Neg Hx    Rectal cancer Neg Hx    Stomach cancer Neg Hx     Social History   Tobacco Use   Smoking status: Former    Packs/day: 1.50    Years: 10.00    Pack years: 15.00    Types: Cigarettes    Quit date: 10/02/1996    Years since quitting: 24.9   Smokeless tobacco: Never   Tobacco comments:    quit 1997  Vaping Use   Vaping Use: Never used  Substance Use Topics   Alcohol use: No    Alcohol/week: 0.0 standard drinks   Drug use: No    Home Medications Prior to Admission medications   Medication Sig Start Date End Date Taking? Authorizing Provider  albuterol (PROVENTIL HFA;VENTOLIN HFA) 108 (90 BASE) MCG/ACT inhaler Inhale 2 puffs into the lungs 4 (four) times daily.    Yes [provider]  albuterol (PROVENTIL) (2.5 MG/3ML) 0.083% nebulizer solution Take 2.5 mg by  nebulization every 6 (six) hours as needed. 02/03/21  Yes [provider]  amLODipine (NORVASC) 10 MG tablet Take 10 mg by mouth daily with lunch.    Yes [provider]  atorvastatin (LIPITOR) 20 MG tablet Take 1 tablet (20 mg total) by mouth daily. 08/19/18  Yes McCue, Janett Billow, NP  chlorthalidone (HYGROTON) 25 MG tablet Take 25 mg by mouth daily.  09/18/19  Yes [provider]  clobetasol cream (TEMOVATE) 1.61 % Apply 1 application topically daily. Apply a thin layer to affected areas 02/17/16  Yes [provider]  fenofibrate 160 MG tablet Take 160 mg by mouth daily. 07/27/19  Yes [provider]  fluocinonide (LIDEX) 0.05 % external solution Apply 1 application topically See admin instructions. Apply to affected areas daily as needed for itching   Yes [provider]  Fluticasone-Umeclidin-Vilant (TRELEGY ELLIPTA) 200-62.5-25 MCG/INH AEPB Inhale 1 puff into the lungs daily. 03/05/21  Yes Spero Geralds, MD  glipiZIDE (GLUCOTROL) 5 MG tablet Take 5 mg by mouth 2 (two) times daily. 11/12/20  Yes [provider]  hydrOXYzine (ATARAX/VISTARIL) 25 MG tablet Take 25 mg by mouth every 8 (eight) hours as needed. 12/25/20  Yes [provider]  levothyroxine (SYNTHROID, LEVOTHROID) 137 MCG tablet Take 137 mcg by mouth daily before breakfast.   Yes [provider]  molnupiravir EUA (LAGEVRIO) 200 mg CAPS capsule Take 4 capsules (800 mg total) by mouth 2 (two) times daily for 5 days. 08/26/21 08/31/21 Yes Spero Geralds, MD  mometasone (ELOCON) 0.1 % cream Apply 1 application topically every other day.  02/27/20  Yes [provider]  montelukast (SINGULAIR) 10 MG tablet Take 10 mg by mouth daily.  03/21/15  Yes [provider]  pantoprazole (PROTONIX) 40 MG tablet TAKE 1 TABLET (40 MG TOTAL) BY MOUTH DAILY. 01/26/17  Yes Irene Shipper, MD  pioglitazone (ACTOS) 15 MG tablet Take 15 mg by mouth daily. 11/12/20  Yes [provider]  quinapril (ACCUPRIL) 40 MG tablet Take 40 mg by mouth daily.    Yes [provider]  Soft Lens Products (B & L SENSITIVE EYES SALINE) 0.4 % SOLN Place 1 drop into both eyes daily as needed (Dry eye).   Yes [provider]  valACYclovir (VALTREX) 1000 MG tablet Take 2 tablets by mouth as needed ([place]).  11/28/19  Yes [provider]  benzonatate (TESSALON) 200 MG capsule Take 200 mg by mouth 3 (three) times daily. 01/29/21   [provider]  doxycycline (VIBRAMYCIN) 100 MG capsule Take 1 capsule (100 mg total) by mouth 2 (two) times daily. 08/30/21  Yes Caryl Ada K, PA-C  gabapentin (NEURONTIN) 100 MG capsule TAKE 1-3 CAPSULES BY MOUTH UP TO THREE TIMES DAILY 12/25/20   [provider]  levofloxacin (LEVAQUIN) 500 MG tablet Take 500 mg by mouth daily. 02/11/21   [provider]  Multiple Vitamins-Minerals (MULTIVITAMIN WITH MINERALS) tablet Take 2 tablets by mouth daily. Gummie    [provider]  oxyCODONE (OXY IR/ROXICODONE) 5 MG immediate release tablet Take 1-2 tablets (5-10 mg total) by mouth every 6 (six) hours as needed for moderate pain, severe pain or breakthrough pain. 08/01/20   Michael Boston, MD    Allergies    Chloramphenicol, Ivp dye [iodinated diagnostic agents], Crestor [rosuvastatin calcium], Iodine, and Sulfa antibiotics  Review of Systems   Review of Systems  Respiratory:  Positive for cough, shortness of breath and wheezing.   All other systems reviewed and are negative.  Physical Exam Updated Vital Signs BP (!) 152/72   Pulse 92   Temp 98.8 F (37.1 C) (Oral)   Resp 19   Ht 5\' 2"  (1.575 m)   Wt 69.9 kg   SpO2 97%   BMI 28.17 kg/m   Physical Exam Vitals reviewed.  Pulmonary:     Breath sounds: Examination of the right-upper field reveals rhonchi. Examination of the left-upper field reveals rhonchi. Examination of the right-middle field reveals rhonchi. Examination of the  left-middle field reveals rhonchi. Examination of the right-lower field reveals rhonchi. Examination of the left-lower field reveals rhonchi. Decreased breath sounds and rhonchi present.  Neurological:     General: No focal deficit present.  Psychiatric:        Mood and Affect: Mood normal.    ED Results / Procedures / Treatments   Labs (all labs ordered are listed, but only abnormal results are displayed) Labs Reviewed  CBC WITH DIFFERENTIAL/PLATELET - Abnormal; Notable for the following components:      Result Value   WBC 10.6 (*)    All other components within normal limits  COMPREHENSIVE METABOLIC PANEL - Abnormal; Notable for the following components:   Glucose, Bld 167 (*)    Creatinine, Ser 1.24 (*)    GFR, Estimated 45 (*)    All other components within normal limits  RESP PANEL BY RT-PCR (FLU A&B, COVID) [id number]    EKG EKG Interpretation  Date/Time:  Saturday August 30 2021 10:30:59 EDT Ventricular Rate:  102 PR Interval:  139 QRS Duration: 88 QT Interval:  320 QTC Calculation: 417 R Axis:   66 Text Interpretation: Sinus tachycardia Low voltage, precordial leads No significant change since last tracing Confirmed by Deno Etienne [phone]) on 08/30/2021 10:41:53 AM  Radiology DG Chest Port 1 View  Result Date: 08/30/2021 CLINICAL DATA:  Cough, fever, body aches, shortness of breath EXAM: PORTABLE CHEST 1 VIEW COMPARISON:  Chest radiograph 01/29/2021 FINDINGS: The heart is mildly enlarged, unchanged. The mediastinal contours are within normal limits. Mild patchy opacity in the lateral right base is seen. There is no focal consolidation. There is no pulmonary edema. There is no pleural effusion or pneumothorax. There is no acute osseous abnormality. IMPRESSION: Mild patchy opacity in the lateral right base may reflect atelectasis, though infection cannot be entirely excluded. Electronically Signed   By: Valetta Mole M.D.   On: 08/30/2021 11:15    Procedures Procedures    Medications Ordered in ED Medications  0.9 %  sodium chloride infusion (1,000 mLs Intravenous New Bag/Given 08/30/21 1206)  sodium chloride 0.9 % bolus 500 mL (0 mLs Intravenous Stopped 08/30/21 1134)  cefTRIAXone (ROCEPHIN) 2 g in sodium chloride 0.9 % 100 mL IVPB (0 g Intravenous Stopped 08/30/21 1237)    ED Course  I have reviewed the triage vital signs and the nursing notes.  Pertinent labs & imaging results that were available during my care of the patient were reviewed by me and considered in my medical decision making (see chart for details).    MDM Rules/Calculators/A&P                         Xray shows possible early pneumonia  covid and influenzaare negative.  Pt given Rocephin IM.  Rx for doxycycline.  Pt advised to see her MD for recheck in 2-3 days    Final Clinical Impression(s) / ED Diagnoses Final diagnoses:  Community acquired pneumonia of left lower lobe of lung    Rx / DC Orders ED Discharge Orders          Ordered    doxycycline (VIBRAMYCIN) 100 MG capsule  2 times daily        08/30/21 1249          An After Visit Summary was printed and given to the patient.    Fransico Meadow, PA-C 08/30/21 [place], [place] 08/30/21 [place], [place], DO 08/30/21 1444

## 2021-08-30 NOTE — Discharge Instructions (Addendum)
Return if any problems.  Continue albuterol nebs.  See your Physician for recheck in 2-3 days.

## 2021-08-30 NOTE — ED Triage Notes (Addendum)
Pt c/o cough, fever, body aches, shortness of breath x 5 days. Hx of COPD. Took home covid test, was positive. Been taking antivirals as prescribed by pulmonologist.

## 2021-08-30 NOTE — ED Notes (Signed)
ED Provider at bedside. 

## 2021-08-30 NOTE — ED Notes (Signed)
Drew and held 1 set blood cultures from L wrist

## 2021-09-02 ENCOUNTER — Ambulatory Visit (HOSPITAL_COMMUNITY): Payer: Medicare Other

## 2021-09-04 ENCOUNTER — Ambulatory Visit (HOSPITAL_COMMUNITY): Payer: Medicare Other

## 2021-09-04 ENCOUNTER — Telehealth (HOSPITAL_COMMUNITY): Payer: Self-pay

## 2021-09-04 NOTE — Telephone Encounter (Signed)
Pt called wanting to schedule for pulmonary rehab, I advised pt that she has about 18 pt in front of her before she can get rescheduled. I advised pt that we would contact her to schedule at a later time.

## 2021-09-17 ENCOUNTER — Encounter: Payer: Self-pay | Admitting: Cardiology

## 2021-09-17 ENCOUNTER — Ambulatory Visit (INDEPENDENT_AMBULATORY_CARE_PROVIDER_SITE_OTHER): Payer: Medicare Other | Admitting: Cardiology

## 2021-09-17 ENCOUNTER — Other Ambulatory Visit: Payer: Self-pay

## 2021-09-17 VITALS — BP 120/66 | HR 86 | Ht 62.0 in | Wt 161.2 lb

## 2021-09-17 DIAGNOSIS — I10 Essential (primary) hypertension: Secondary | ICD-10-CM | POA: Diagnosis not present

## 2021-09-17 DIAGNOSIS — I517 Cardiomegaly: Secondary | ICD-10-CM | POA: Diagnosis not present

## 2021-09-17 DIAGNOSIS — R0609 Other forms of dyspnea: Secondary | ICD-10-CM

## 2021-09-17 NOTE — Progress Notes (Signed)
Cardiology CONSULT Note    Date:  09/17/2021   ID:  Kelly Mooney, DOB 05/28/1946, MRN [id number]  PCP:  Kelly Cruel, MD  Cardiologist:  Kelly Him, MD   No chief complaint on file.   History of Present Illness:  Kelly Mooney is a 75 y.o. female who is being seen today for the evaluation of shortness of breath and cardiomegaly at the request of Kelly Cruel, MD.  Is a 75 year old female with a history of arthritis, asthma, COPD, diabetes mellitus 2, GERD, hypertension and hyperlipidemia who was recently seen in the emergency room last month with complaints of shortness of breath.  BP was mildly elevated at 152/72 mmHg at the time.  EKG showed sinus tachycardia 102 bpm with low voltage QRS in the precordial leads.  Chest x-ray showed possible early pneumonia and cardiomegaly on chest x-ray.  She is now referred for evaluation of cardiomegaly.  2D echocardiogram done 07/06/2018 showing normal LV size and systolic function with grade 1 diastolic dysfunction, calcified mitral valve annulus.  She is here today for followup and is doing well.  She has chronic SOB related to her COPD.  She will get SOB when she walks fast in the house.  She has enrolled in Pulmonary rehab to help her get more active.  She denies any chest pain or pressure, PND, orthopnea (except when sick with PNA), LE edema, dizziness, palpitations or syncope. She is compliant with her meds and is tolerating meds with no SE.   Her father died at 74 of an MI and her brother died at 37 of an MI and another brother at 69 of MI and another brother with CAD and PCI.   Past Medical History:  Diagnosis Date   Arthritis    Asthma    since age 77   Cataract    Colon polyps    Complication of anesthesia    slow to wake up    COPD (chronic obstructive pulmonary disease) ([place])    COVID-19 09/2019   Diabetes mellitus    Diverticulosis    Dyspnea    with exertoin due to asthma    Esophageal stricture    Family  history of adverse reaction to anesthesia    mother , daughter and son slow to wake up and  problems with N/V    GERD (gastroesophageal reflux disease)    Hemorrhoids    Hiatal hernia    History of kidney stones    Hypercholesteremia    Hypertension    Hypothyroidism    IBS (irritable bowel syndrome)    Paraesophageal hernia    PONV (postoperative nausea and vomiting)    Stroke ([place])    no deficits    Tubular adenoma of colon     Past Surgical History:  Procedure Laterality Date   ABDOMINAL HYSTERECTOMY     BUNIONECTOMY Right 10/2012   CATARACT EXTRACTION, BILATERAL     CHOLECYSTECTOMY  1980   ESOPHAGEAL MANOMETRY N/A 07/15/2015   Procedure: ESOPHAGEAL MANOMETRY (EM);  Surgeon: Irene Shipper, MD;  Location: [facility];  Service: Endoscopy;  Laterality: N/A;   HEMORRHOID SURGERY N/A 08/01/2020   Procedure: HEMORRHOIDECTOMY, HEMORRHOIDAL LIGATION Pollyann Savoy ANORECTAL EXAMINATION UNDER ANESTHESIA;  Surgeon: Michael Boston, MD;  Location: [facility];  Service: General;  Laterality: N/A;  LOCAL   PARTIAL HYSTERECTOMY     RHINOPLASTY     nasal polyps removed dr. Barrie Folk    Current Medications: Current Meds  Medication Sig  albuterol (PROVENTIL HFA;VENTOLIN HFA) 108 (90 BASE) MCG/ACT inhaler Inhale 2 puffs into the lungs 4 (four) times daily.    albuterol (PROVENTIL) (2.5 MG/3ML) 0.083% nebulizer solution Take 2.5 mg by nebulization every 6 (six) hours as needed.   amLODipine (NORVASC) 10 MG tablet Take 10 mg by mouth daily with lunch.    atorvastatin (LIPITOR) 20 MG tablet Take 1 tablet (20 mg total) by mouth daily.   benzonatate (TESSALON) 200 MG capsule Take 200 mg by mouth 3 (three) times daily.   chlorthalidone (HYGROTON) 25 MG tablet Take 25 mg by mouth daily.    clobetasol cream (TEMOVATE) 6.23 % Apply 1 application topically daily. Apply a thin layer to affected areas   doxycycline (VIBRAMYCIN) 100 MG capsule Take 1 capsule (100 mg total) by mouth 2 (two) times daily.   fenofibrate  160 MG tablet Take 160 mg by mouth daily.   fluorometholone (FML) 0.1 % ophthalmic suspension fluorometholone 0.1 % eye drops,suspension   Fluticasone-Umeclidin-Vilant (TRELEGY ELLIPTA) 200-62.5-25 MCG/INH AEPB Inhale 1 puff into the lungs daily.   glipiZIDE (GLUCOTROL) 5 MG tablet Take 5 mg by mouth 2 (two) times daily.   hydrOXYzine (ATARAX/VISTARIL) 25 MG tablet Take 25 mg by mouth every 8 (eight) hours as needed.   levothyroxine (SYNTHROID, LEVOTHROID) 137 MCG tablet Take 137 mcg by mouth daily before breakfast.   mometasone (ELOCON) 0.1 % cream Apply 1 application topically every other day.    montelukast (SINGULAIR) 10 MG tablet Take 10 mg by mouth daily.    Multiple Vitamins-Minerals (MULTIVITAMIN WITH MINERALS) tablet Take 2 tablets by mouth daily. Gummie   pantoprazole (PROTONIX) 40 MG tablet TAKE 1 TABLET (40 MG TOTAL) BY MOUTH DAILY.   pioglitazone (ACTOS) 15 MG tablet Take 15 mg by mouth daily.   quinapril (ACCUPRIL) 40 MG tablet Take 40 mg by mouth daily.    Soft Lens Products (B & L SENSITIVE EYES SALINE) 0.4 % SOLN Place 1 drop into both eyes daily as needed (Dry eye).   valACYclovir (VALTREX) 1000 MG tablet Take 2 tablets by mouth as needed ([place]).     Allergies:   Chloramphenicol, Ivp dye [iodinated diagnostic agents], Crestor [rosuvastatin calcium], Iodine, and Sulfa antibiotics   Social History   Socioeconomic History   Marital status: Widowed    Spouse name: Not on file   Number of children: 2   Years of education: 11   Highest education level: Not on file  Occupational History   Occupation: Retired  Tobacco Use   Smoking status: Former    Packs/day: 1.50    Years: 10.00    Pack years: 15.00    Types: Cigarettes    Quit date: 10/02/1996    Years since quitting: 24.9   Smokeless tobacco: Never   Tobacco comments:    quit 1997  Vaping Use   Vaping Use: Never used  Substance and Sexual Activity   Alcohol use: No    Alcohol/week: 0.0 standard drinks    Drug use: No   Sexual activity: Not on file  Other Topics Concern   Not on file  Social History Narrative   Not on file   Social Determinants of Health   Financial Resource Strain: Not on file  Food Insecurity: Not on file  Transportation Needs: Not on file  Physical Activity: Not on file  Stress: Not on file  Social Connections: Not on file     Family History:  The patient's family history includes Asthma in her brother and  mother; Diabetes Mellitus I in her mother; Heart attack in her brother, brother, and father; Hyperlipidemia in her mother.   ROS:   Please see the history of present illness.    ROS All other systems reviewed and are negative.  No flowsheet data found.  PHYSICAL EXAM:   VS:  BP 120/66   Pulse 86   Ht 5\' 2"  (1.575 m)   Wt 161 lb 3.2 oz (73.1 kg)   SpO2 97%   BMI 29.48 kg/m    GEN: Well nourished, well developed, in no acute distress  HEENT: normal  Neck: no JVD, carotid bruits, or masses Cardiac: RRR; no murmurs, rubs, or gallops,no edema.  Intact distal pulses bilaterally.  Respiratory:  clear to auscultation bilaterally, normal work of breathing GI: soft, nontender, nondistended, + BS MS: no deformity or atrophy  Skin: warm and dry, no rash Neuro:  Alert and Oriented x 3, Strength and sensation are intact Psych: euthymic mood, full affect  Wt Readings from Last 3 Encounters:  09/17/21 161 lb 3.2 oz (73.1 kg)  08/30/21 154 lb (69.9 kg)  04/21/21 157 lb 3.2 oz (71.3 kg)    Studies/Labs Reviewed:   EKG:  EKG is not ordered today.   Recent Labs: 08/30/2021: ALT 12; BUN 21; Creatinine, Ser 1.24; Hemoglobin 12.8; Platelets 250; Potassium 3.8; Sodium 139   Lipid Panel    Component Value Date/Time   CHOL 139 07/06/2018 0505   TRIG 168 (H) 10/02/2019 1439   HDL 35 (L) 07/06/2018 0505   CHOLHDL 4.0 07/06/2018 0505   VLDL 53 (H) 07/06/2018 0505   LDLCALC 51 07/06/2018 0505    Additional studies/ records that were reviewed today include:   Notes from hospitalization in the ER, EKG and chest x-ray    ASSESSMENT:    1. Cardiomegaly   2. Essential hypertension   3. DOE (dyspnea on exertion)      PLAN:  In order of problems listed above:  Cardiomegaly -This was mentioned on chest x-ray recently done for shortness of breath and possible bronchitis -2D echo in 2019 showed normal LV size  -suspect cardiomegaly documented on chest x-ray is more likely a prominent pericardial fat pad -I will get a 2D echocardiogram to assess LV size and function  2.  Hypertension -Blood pressure is well controlled on exam today -Continue prescription drug management with amlodipine 10 mg daily, chlorthalidone 25 mg daily and quinapril 40 mg daily with as needed refills -Labs ordered by emergency room physician on 08/30/2021 personally reviewed showing Mooney creatinine 1.24 and potassium 3.8  3.  DOE -suspect this is related to COPD>she has no CP -starting Pulmonary Rehab on Friday -given her fm hx of premature CAD, HTN, DM2, HLD and remote tobacco use I will get a Lexiscan myoview to rule out anginal equivalent and a coronary Ca score to assess future risk -Shared Decision Making/Informed Consent The risks [chest pain, shortness of breath, cardiac arrhythmias, dizziness, blood pressure fluctuations, myocardial infarction, stroke/transient ischemic attack, nausea, vomiting, allergic reaction, radiation exposure, metallic taste sensation and life-threatening complications (estimated to be 1 in 10,000)], benefits (risk stratification, diagnosing coronary artery disease, treatment guidance) and alternatives of a nuclear stress test were discussed in detail with Ms. Yera and she agrees to proceed.   Time Spent: 20 minutes total time of encounter, including 15 minutes spent in face-to-face patient care on the date of this encounter. This time includes coordination of care and counseling regarding above mentioned problem list. Remainder of  non-face-to-face  time involved reviewing chart documents/testing relevant to the patient encounter and documentation in the medical record. I have independently reviewed documentation from referring provider  Medication Adjustments/Labs and Tests Ordered: Current medicines are reviewed at length with the patient today.  Concerns regarding medicines are outlined above.  Medication changes, Labs and Tests ordered today are listed in the Patient Instructions below.  There are no Patient Instructions on file for this visit.   Signed, Kelly Him, MD  09/17/2021 1:45 PM    [facility] [address], [place], [place]  [address] Phone: [phone]; Fax: [phone]

## 2021-09-17 NOTE — Patient Instructions (Signed)
Medication Instructions:  Your physician recommends that you continue on your current medications as directed. Please refer to the Current Medication list given to you today.  *If you need a refill on your cardiac medications before your next appointment, please call your pharmacy*   Testing/Procedures: Your physician has requested that you have an echocardiogram. Echocardiography is a painless test that uses sound waves to create images of your heart. It provides your doctor with information about the size and shape of your heart and how well your heart's chambers and valves are working. This procedure takes approximately one hour. There are no restrictions for this procedure.  Your physician has requested that you have a lexiscan myoview. For further information please visit [email or web address]. Please follow instruction sheet, as given.  Your physician has requested that you have a calcium score CT scan.    Follow-Up: At [facility], you and your health needs are our priority.  As part of our continuing mission to provide you with exceptional heart care, we have created designated Provider Care Teams.  These Care Teams include your primary Cardiologist (physician) and Advanced Practice Providers (APPs -  Physician Assistants and Nurse Practitioners) who all work together to provide you with the care you need, when you need it.  Follow up with Dr. Radford Pax as needed based on results of testing.

## 2021-09-17 NOTE — Addendum Note (Signed)
Addended by: Fransico Him R on: 09/17/2021 02:25 PM   Modules accepted: Orders

## 2021-09-17 NOTE — Addendum Note (Signed)
Addended by: Antonieta Iba on: 09/17/2021 01:49 PM   Modules accepted: Orders

## 2021-09-18 ENCOUNTER — Telehealth (HOSPITAL_COMMUNITY): Payer: Self-pay | Admitting: *Deleted

## 2021-09-19 ENCOUNTER — Other Ambulatory Visit: Payer: Self-pay

## 2021-09-19 ENCOUNTER — Encounter (HOSPITAL_COMMUNITY)
Admission: RE | Admit: 2021-09-19 | Discharge: 2021-09-19 | Disposition: A | Discharge status: Home / Self Care | Payer: Medicare Other | Source: Ambulatory Visit | Attending: Internal Medicine | Admitting: Internal Medicine

## 2021-09-19 ENCOUNTER — Encounter (HOSPITAL_COMMUNITY): Payer: Self-pay

## 2021-09-19 VITALS — BP 120/66 | HR 86 | Ht 62.0 in | Wt 159.8 lb

## 2021-09-19 DIAGNOSIS — J449 Chronic obstructive pulmonary disease, unspecified: Secondary | ICD-10-CM | POA: Insufficient documentation

## 2021-09-19 NOTE — Progress Notes (Signed)
Patient in for Pulmonary rehab orientation. Assessed anterior and posterior right and left upper and lower lobes which were clear to auscultation. There were no audible noises associated with patient's breathing. Patient reports no current issues with breathing. Patient in good spirits and no issues reported going through the orientation process today.

## 2021-09-19 NOTE — Progress Notes (Addendum)
Kelly Mooney 75 y.o. female Pulmonary Rehab Orientation Note This patient who was referred to Pulmonary Rehab by Dr. 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 She arrived ambulatory with normal gait. She  does not carry portable oxygen. Kelly Mooney does not have a home oxygen prescription. Color good, skin warm and dry. Patient is oriented to time and place. Patient's medical history, psychosocial health, and medications reviewed. Psychosocial assessment reveals pt lives with alone. Kelly Mooney is currently retired. Her hobbies include sewing, doing crossword puzzles, watching tv, and spending time with her grandchildren and great grandchildren. She wants to get back into exercising and knows that Pulmonary Rehab can give her motivation to exercise. Pt reports no stress. Pt does not exhibit signs of depression. PHQ2/9 score 0/2. Kelly Mooney shows good  coping skills with positive outlook . Offered emotional support and reassurance . Physical assessment reveals heart rate is normal, breath sounds clear to auscultation, no wheezes, rales, or rhonchi. Grip strength equal, strong. Distal pulses present. Kelly Mooney reports she does take medications as prescribed. Patient states she follows a Regular diet. Kelly Mooney wants to lose some weight. Patient's weight will be monitored closely. Demonstration and practice of PLB using pulse oximeter. [name] able to return demonstration satisfactorily. Safety and hand hygiene in the exercise area reviewed with patient. Kelly Mooney voices understanding of the information reviewed. Department expectations discussed with patient and achievable goals were set. The patient shows enthusiasm about attending the program and we look forward to working with Kelly Mooney. Kelly Mooney completed a 6 min walk test today and is scheduled to begin exercise on 09/23/21.   [id number] Kelly Else, MS, ACSM-CEP

## 2021-09-19 NOTE — Progress Notes (Signed)
Pulmonary Individual Treatment Plan  Patient Details  Name: Kelly Mooney MRN: [id number] Date of Birth: May 11, 1946 Referring Provider:   [name] Pulmonary Rehab Walk Test from 09/19/2021 in [place]  Referring Provider [name]       Initial Encounter Date:  Flowsheet Row Pulmonary Rehab Walk Test from 09/19/2021 in [place]  Date 09/19/21       Visit Diagnosis: COPD with chronic bronchitis and emphysema ([place])  Patient's Home Medications on Admission:   Current Outpatient Medications:    albuterol (PROVENTIL HFA;VENTOLIN HFA) 108 (90 BASE) MCG/ACT inhaler, Inhale 2 puffs into the lungs 4 (four) times daily. , Disp: , Rfl:    albuterol (PROVENTIL) (2.5 MG/3ML) 0.083% nebulizer solution, Take 2.5 mg by nebulization every 6 (six) hours as needed., Disp: , Rfl:    amLODipine (NORVASC) 10 MG tablet, Take 10 mg by mouth daily with lunch. , Disp: , Rfl:    atorvastatin (LIPITOR) 20 MG tablet, Take 1 tablet (20 mg total) by mouth daily., Disp: 90 tablet, Rfl: 3   benzonatate (TESSALON) 200 MG capsule, Take 200 mg by mouth 3 (three) times daily., Disp: , Rfl:    chlorthalidone (HYGROTON) 25 MG tablet, Take 25 mg by mouth daily. , Disp: , Rfl:    clobetasol cream (TEMOVATE) 4.09 %, Apply 1 application topically daily. Apply a thin layer to affected areas, Disp: , Rfl: 4   fenofibrate 160 MG tablet, Take 160 mg by mouth daily., Disp: , Rfl:    Fluticasone-Umeclidin-Vilant (TRELEGY ELLIPTA) 200-62.5-25 MCG/INH AEPB, Inhale 1 puff into the lungs daily., Disp: 1 each, Rfl: 5   glipiZIDE (GLUCOTROL) 5 MG tablet, Take 5 mg by mouth 2 (two) times daily., Disp: , Rfl:    hydrOXYzine (ATARAX/VISTARIL) 25 MG tablet, Take 25 mg by mouth every 8 (eight) hours as needed., Disp: , Rfl:    levothyroxine (SYNTHROID, LEVOTHROID) 137 MCG tablet, Take 137 mcg by mouth daily before breakfast., Disp: , Rfl:    mometasone (ELOCON) 0.1 %  cream, Apply 1 application topically every other day. , Disp: , Rfl:    montelukast (SINGULAIR) 10 MG tablet, Take 10 mg by mouth daily. , Disp: , Rfl: 4   pantoprazole (PROTONIX) 40 MG tablet, TAKE 1 TABLET (40 MG TOTAL) BY MOUTH DAILY., Disp: 30 tablet, Rfl: 3   pioglitazone (ACTOS) 15 MG tablet, Take 15 mg by mouth daily., Disp: , Rfl:    quinapril (ACCUPRIL) 40 MG tablet, Take 40 mg by mouth daily. , Disp: , Rfl:    Soft Lens Products (B & L SENSITIVE EYES SALINE) 0.4 % SOLN, Place 1 drop into both eyes daily as needed (Dry eye)., Disp: , Rfl:    valACYclovir (VALTREX) 1000 MG tablet, Take 2 tablets by mouth as needed (Cold Sore). , Disp: , Rfl:    doxycycline (VIBRAMYCIN) 100 MG capsule, Take 1 capsule (100 mg total) by mouth 2 (two) times daily. (Patient not taking: Reported on 09/19/2021), Disp: 20 capsule, Rfl: 0   fluocinonide (LIDEX) 0.05 % external solution, Apply 1 application topically See admin instructions. Apply to affected areas daily as needed for itching (Patient not taking: No sig reported), Disp: , Rfl:    fluorometholone (FML) 0.1 % ophthalmic suspension, fluorometholone 0.1 % eye drops,suspension (Patient not taking: Reported on 09/19/2021), Disp: , Rfl:    gabapentin (NEURONTIN) 100 MG capsule, TAKE 1-3 CAPSULES BY MOUTH UP TO THREE TIMES DAILY (Patient not taking: Reported on 09/17/2021),  Disp: , Rfl:    levofloxacin (LEVAQUIN) 500 MG tablet, Take 500 mg by mouth daily. (Patient not taking: Reported on 09/17/2021), Disp: , Rfl:    Multiple Vitamins-Minerals (MULTIVITAMIN WITH MINERALS) tablet, Take 2 tablets by mouth daily. Gummie (Patient not taking: Reported on 09/19/2021), Disp: , Rfl:    oxyCODONE (OXY IR/ROXICODONE) 5 MG immediate release tablet, Take 1-2 tablets (5-10 mg total) by mouth every 6 (six) hours as needed for moderate pain, severe pain or breakthrough pain. (Patient not taking: Reported on 09/17/2021), Disp: 30 tablet, Rfl: 0  Past Medical History: Past Medical  History:  Diagnosis Date   Arthritis    Asthma    since age 15   Cataract    Colon polyps    Complication of anesthesia    slow to wake up    COPD (chronic obstructive pulmonary disease) ([place])    COVID-19 09/2019   Diabetes mellitus    Diverticulosis    Dyspnea    with exertoin due to asthma    Esophageal stricture    Family history of adverse reaction to anesthesia    mother , daughter and son slow to wake up and  problems with N/V    GERD (gastroesophageal reflux disease)    Hemorrhoids    Hiatal hernia    History of kidney stones    Hypercholesteremia    Hypertension    Hypothyroidism    IBS (irritable bowel syndrome)    Paraesophageal hernia    PONV (postoperative nausea and vomiting)    Stroke (HCC)    no deficits    Tubular adenoma of colon     Tobacco Use: Social History   Tobacco Use  Smoking Status Former   Packs/day: 1.50   Years: 10.00   Pack years: 15.00   Types: Cigarettes   Quit date: 10/02/1996   Years since quitting: 24.9  Smokeless Tobacco Never  Tobacco Comments   quit 1997    Labs: Recent Review Flowsheet Data     Labs for ITP Cardiac and Pulmonary Rehab Latest Ref Rng & Units 07/05/2018 07/06/2018 09/26/2018 10/02/2019 07/26/2020   Cholestrol 0 - 200 mg/dL - 139 - - -   LDLCALC 0 - 99 mg/dL - 51 - - -   HDL >40 mg/dL - 35(L) - - -   Trlycerides <150 mg/dL - 266(H) - 168(H) -   Hemoglobin A1c 4.8 - 5.6 % - 6.4(H) - - 6.3(H)   TCO2 22 - 32 mmol/L 25 - 28 - -       Capillary Blood Glucose: Lab Results  Component Value Date   GLUCAP 134 (H) 08/01/2020   GLUCAP 137 (H) 08/01/2020   GLUCAP 139 (H) 07/06/2018   GLUCAP 108 (H) 07/06/2018   GLUCAP 101 (H) 07/05/2018     Pulmonary Assessment Scores:  Pulmonary Assessment Scores     Row Name 09/19/21 1422         ADL UCSD   ADL Phase Entry     SOB Score total 35       CAT Score   CAT Score 20       mMRC Score   mMRC Score 4             UCSD: Self-administered rating  of dyspnea associated with activities of daily living (ADLs) 6-point scale (0 = "not at all" to 5 = "maximal or unable to do because of breathlessness")  Scoring Scores range from 0 to 120.  Minimally important difference is  5 units  CAT: CAT can identify the health impairment of COPD patients and is better correlated with disease progression.  CAT has a scoring range of zero to 40. The CAT score is classified into four groups of low (less than 10), medium (10 - 20), high (21-30) and very high (31-40) based on the impact level of disease on health status. A CAT score over 10 suggests significant symptoms.  A worsening CAT score could be explained by an exacerbation, poor medication adherence, poor inhaler technique, or progression of COPD or comorbid conditions.  CAT MCID is 2 points  mMRC: mMRC (Modified Medical Research Council) Dyspnea Scale is used to assess the degree of baseline functional disability in patients of respiratory disease due to dyspnea. No minimal important difference is established. A decrease in score of 1 point or greater is considered a positive change.   Pulmonary Function Assessment:  Pulmonary Function Assessment - 09/19/21 1425       Breath   Bilateral Breath Sounds Clear    Shortness of Breath No             Exercise Target Goals: Exercise Program Goal: Individual exercise prescription set using results from initial 6 min walk test and THRR while considering  patient's activity barriers and safety.   Exercise Prescription Goal: Initial exercise prescription builds to 30-45 minutes a day of aerobic activity, 2-3 days per week.  Home exercise guidelines will be given to patient during program as part of exercise prescription that the participant will acknowledge.  Activity Barriers & Risk Stratification:  Activity Barriers & Cardiac Risk Stratification - 09/19/21 1105       Activity Barriers & Cardiac Risk Stratification   Activity Barriers  Arthritis;Deconditioning;Muscular Weakness;Shortness of Breath    Cardiac Risk Stratification Low             6 Minute Walk:  6 Minute Walk     Row Name 09/19/21 1351         6 Minute Walk   Phase Initial     Distance 1518 feet     Walk Time 6 minutes     # of Rest Breaks 0     MPH 2.88     METS 2.91     RPE 11     Perceived Dyspnea  1     VO2 Peak 10.2     Symptoms No     Resting HR 86 bpm     Resting BP 120/66     Resting Oxygen Saturation  98 %     Exercise Oxygen Saturation  during 6 min walk 98 %     Max Ex. HR 109 bpm     Max Ex. BP 138/70     2 Minute Post BP 130/70       Interval HR   1 Minute HR 92     2 Minute HR 99     3 Minute HR 105     4 Minute HR 108     5 Minute HR 106     6 Minute HR 109     2 Minute Post HR 89     Interval Heart Rate? Yes       Interval Oxygen   Interval Oxygen? Yes     Baseline Oxygen Saturation % 98 %     1 Minute Oxygen Saturation % 99 %     1 Minute Liters of Oxygen 0 L     2 Minute Oxygen Saturation %  98 %     2 Minute Liters of Oxygen 0 L     3 Minute Oxygen Saturation % 98 %     3 Minute Liters of Oxygen 0 L     4 Minute Oxygen Saturation % 99 %     4 Minute Liters of Oxygen 0 L     5 Minute Oxygen Saturation % 99 %     5 Minute Liters of Oxygen 0 L     6 Minute Oxygen Saturation % 98 %     6 Minute Liters of Oxygen 0 L     2 Minute Post Oxygen Saturation % 99 %     2 Minute Post Liters of Oxygen 0 L              Oxygen Initial Assessment:  Oxygen Initial Assessment - 09/19/21 1108       Home Oxygen   Home Oxygen Device None    Sleep Oxygen Prescription None    Home Exercise Oxygen Prescription None    Home Resting Oxygen Prescription None      Initial 6 min Walk   Oxygen Used None      Program Oxygen Prescription   Program Oxygen Prescription None      Intervention   Short Term Goals To learn and exhibit compliance with exercise, home and travel O2 prescription;To learn and understand  importance of monitoring SPO2 with pulse oximeter and demonstrate accurate use of the pulse oximeter.;To learn and understand importance of maintaining oxygen saturations>88%;To learn and demonstrate proper pursed lip breathing techniques or other breathing techniques. ;To learn and demonstrate proper use of respiratory medications    Long  Term Goals Exhibits compliance with exercise, home  and travel O2 prescription;Verbalizes importance of monitoring SPO2 with pulse oximeter and return demonstration;Maintenance of O2 saturations>88%;Exhibits proper breathing techniques, such as pursed lip breathing or other method taught during program session;Compliance with respiratory medication;Demonstrates proper use of MDI's             Oxygen Re-Evaluation:   Oxygen Discharge (Final Oxygen Re-Evaluation):   Initial Exercise Prescription:  Initial Exercise Prescription - 09/19/21 1300       Date of Initial Exercise RX and Referring Provider   Date 09/19/21    Referring Provider [name]    Expected Discharge Date 11/20/21      Treadmill   MPH 2.5    Grade 0    Minutes 15    METs 2.91      NuStep   Level 1    SPM 70    Minutes 15      Prescription Details   Frequency (times per week) 2    Duration Progress to 30 minutes of continuous aerobic without signs/symptoms of physical distress      Intensity   THRR 40-80% of Max Heartrate 76-115    Ratings of Perceived Exertion 11-13    Perceived Dyspnea 0-4      Progression   Progression Continue progressive overload as per policy without signs/symptoms or physical distress.      Resistance Training   Training Prescription Yes    Weight Blue bands    Reps 10-15             Perform Capillary Blood Glucose checks as needed.  Exercise Prescription Changes:   Exercise Comments:   Exercise Goals and Review:   Exercise Goals     Row Name 09/19/21 1357  Exercise Goals   Increase Physical Activity Yes        Intervention Provide advice, education, support and counseling about physical activity/exercise needs.;Develop an individualized exercise prescription for aerobic and resistive training based on initial evaluation findings, risk stratification, comorbidities and participant's personal goals.       Expected Outcomes Short Term: Attend rehab on a regular basis to increase amount of physical activity.;Long Term: Add in home exercise to make exercise part of routine and to increase amount of physical activity.;Long Term: Exercising regularly at least 3-5 days a week.       Increase Strength and Stamina Yes       Intervention Provide advice, education, support and counseling about physical activity/exercise needs.;Develop an individualized exercise prescription for aerobic and resistive training based on initial evaluation findings, risk stratification, comorbidities and participant's personal goals.       Expected Outcomes Short Term: Increase workloads from initial exercise prescription for resistance, speed, and METs.;Short Term: Perform resistance training exercises routinely during rehab and add in resistance training at home;Long Term: Improve cardiorespiratory fitness, muscular endurance and strength as measured by increased METs and functional capacity (6MWT)       Able to understand and use rate of perceived exertion (RPE) scale Yes       Intervention Provide education and explanation on how to use RPE scale       Expected Outcomes Short Term: Able to use RPE daily in rehab to express subjective intensity level;Long Term:  Able to use RPE to guide intensity level when exercising independently       Able to understand and use Dyspnea scale Yes       Intervention Provide education and explanation on how to use Dyspnea scale       Expected Outcomes Short Term: Able to use Dyspnea scale daily in rehab to express subjective sense of shortness of breath during exertion;Long Term: Able to use Dyspnea scale  to guide intensity level when exercising independently       Knowledge and understanding of Target Heart Rate Range (THRR) Yes       Intervention Provide education and explanation of THRR including how the numbers were predicted and where they are located for reference       Expected Outcomes Short Term: Able to state/look up THRR;Short Term: Able to use daily as guideline for intensity in rehab;Long Term: Able to use THRR to govern intensity when exercising independently       Understanding of Exercise Prescription Yes       Intervention Provide education, explanation, and written materials on patient's individual exercise prescription       Expected Outcomes Short Term: Able to explain program exercise prescription;Long Term: Able to explain home exercise prescription to exercise independently                Exercise Goals Re-Evaluation :   Discharge Exercise Prescription (Final Exercise Prescription Changes):   Nutrition:  Target Goals: Understanding of nutrition guidelines, daily intake of sodium 1500mg , cholesterol 200mg , calories 30% from fat and 7% or less from saturated fats, daily to have 5 or more servings of fruits and vegetables.  Biometrics:  Pre Biometrics - 09/19/21 1129       Pre Biometrics   Grip Strength 18 kg              Nutrition Therapy Plan and Nutrition Goals:   Nutrition Assessments:  MEDIFICTS Score Key: ?70 Need to make dietary changes  40-70 Heart  Healthy Diet ? 40 Therapeutic Level Cholesterol Diet   Picture Your Plate Scores: <19 Unhealthy dietary pattern with much room for improvement. 41-50 Dietary pattern unlikely to meet recommendations for good health and room for improvement. 51-60 More healthful dietary pattern, with some room for improvement.  >60 Healthy dietary pattern, although there may be some specific behaviors that could be improved.    Nutrition Goals Re-Evaluation:   Nutrition Goals Discharge (Final Nutrition  Goals Re-Evaluation):   Psychosocial: Target Goals: Acknowledge presence or absence of significant depression and/or stress, maximize coping skills, provide positive support system. Participant is able to verbalize types and ability to use techniques and skills needed for reducing stress and depression.  Initial Review & Psychosocial Screening:  Initial Psych Review & Screening - 09/19/21 1111       Initial Review   Current issues with None Identified      Family Dynamics   Good Support System? Yes   Has sister, children, and friends     Barriers   Psychosocial barriers to participate in program There are no identifiable barriers or psychosocial needs.      Screening Interventions   Interventions Encouraged to exercise             Quality of Life Scores:  Scores of 19 and below usually indicate a poorer quality of life in these areas.  A difference of  2-3 points is a clinically meaningful difference.  A difference of 2-3 points in the total score of the Quality of Life Index has been associated with significant improvement in overall quality of life, self-image, physical symptoms, and general health in studies assessing change in quality of life.  PHQ-9: Recent Review Flowsheet Data     Depression screen [facility] 2/9 09/19/2021   Decreased Interest 0   Down, Depressed, Hopeless 0   PHQ - 2 Score 0   Altered sleeping 1    Tired, decreased energy 1    Change in appetite 0   Feeling bad or failure about yourself  0   Trouble concentrating 0   Moving slowly or fidgety/restless 0   Suicidal thoughts 0   PHQ-9 Score 2   Difficult doing work/chores Not difficult at all      Interpretation of Total Score  Total Score Depression Severity:  1-4 = Minimal depression, 5-9 = Mild depression, 10-14 = Moderate depression, 15-19 = Moderately severe depression, 20-27 = Severe depression   Psychosocial Evaluation and Intervention:   Psychosocial Re-Evaluation:   Psychosocial  Discharge (Final Psychosocial Re-Evaluation):   Education: Education Goals: Education classes will be provided on a weekly basis, covering required topics. Participant will state understanding/return demonstration of topics presented.  Learning Barriers/Preferences:  Learning Barriers/Preferences - 09/19/21 1114       Learning Barriers/Preferences   Learning Barriers None    Learning Preferences Audio;Computer/Internet;Group Instruction;Individual Instruction;Pictoral;Skilled Demonstration;Verbal Instruction;Video;Written Material             Education Topics: Risk Factor Reduction:  -Group instruction that is supported by a PowerPoint presentation. Instructor discusses the definition of a risk factor, different risk factors for pulmonary disease, and how the heart and lungs work together.     Nutrition for Pulmonary Patient:  -Group instruction provided by PowerPoint slides, verbal discussion, and written materials to support subject matter. The instructor gives an explanation and review of healthy diet recommendations, which includes a discussion on weight management, recommendations for fruit and vegetable consumption, as well as protein, fluid, caffeine, fiber, sodium, sugar, and  alcohol. Tips for eating when patients are short of breath are discussed.   Pursed Lip Breathing:  -Group instruction that is supported by demonstration and informational handouts. Instructor discusses the benefits of pursed lip and diaphragmatic breathing and detailed demonstration on how to preform both.     Oxygen Safety:  -Group instruction provided by PowerPoint, verbal discussion, and written material to support subject matter. There is an overview of "What is Oxygen" and "Why do we need it".  Instructor also reviews how to create a safe environment for oxygen use, the importance of using oxygen as prescribed, and the risks of noncompliance. There is a brief discussion on traveling with oxygen and  resources the patient may utilize.   Oxygen Equipment:  -Group instruction provided by [facility] Staff utilizing handouts, written materials, and equipment demonstrations.   Signs and Symptoms:  -Group instruction provided by written material and verbal discussion to support subject matter. Warning signs and symptoms of infection, stroke, and heart attack are reviewed and when to call the physician/911 reinforced. Tips for preventing the spread of infection discussed.   Advanced Directives:  -Group instruction provided by verbal instruction and written material to support subject matter. Instructor reviews Advanced Directive laws and proper instruction for filling out document.   Pulmonary Video:  -Group video education that reviews the importance of medication and oxygen compliance, exercise, good nutrition, pulmonary hygiene, and pursed lip and diaphragmatic breathing for the pulmonary patient.   Exercise for the Pulmonary Patient:  -Group instruction that is supported by a PowerPoint presentation. Instructor discusses benefits of exercise, core components of exercise, frequency, duration, and intensity of an exercise routine, importance of utilizing pulse oximetry during exercise, safety while exercising, and options of places to exercise outside of rehab.     Pulmonary Medications:  -Verbally interactive group education provided by instructor with focus on inhaled medications and proper administration.   Anatomy and Physiology of the Respiratory System and Intimacy:  -Group instruction provided by PowerPoint, verbal discussion, and written material to support subject matter. Instructor reviews respiratory cycle and anatomical components of the respiratory system and their functions. Instructor also reviews differences in obstructive and restrictive respiratory diseases with examples of each. Intimacy, Sex, and Sexuality differences are reviewed with a discussion on how relationships  can change when diagnosed with pulmonary disease. Common sexual concerns are reviewed.   MD DAY -A group question and answer session with a medical doctor that allows participants to ask questions that relate to their pulmonary disease state.   OTHER EDUCATION -Group or individual verbal, written, or video instructions that support the educational goals of the pulmonary rehab program.   Holiday Eating Survival Tips:  -Group instruction provided by PowerPoint slides, verbal discussion, and written materials to support subject matter. The instructor gives patients tips, tricks, and techniques to help them not only survive but enjoy the holidays despite the onslaught of food that accompanies the holidays.   Knowledge Questionnaire Score:  Knowledge Questionnaire Score - 09/19/21 1132       Knowledge Questionnaire Score   Pre Score 14/18             Core Components/Risk Factors/Patient Goals at Admission:  Personal Goals and Risk Factors at Admission - 09/19/21 1116       Core Components/Risk Factors/Patient Goals on Admission    Weight Management Weight Loss    Improve shortness of breath with ADL's Yes    Intervention Provide education, individualized exercise plan and daily activity instruction to  help decrease symptoms of SOB with activities of daily living.    Expected Outcomes Short Term: Improve cardiorespiratory fitness to achieve a reduction of symptoms when performing ADLs;Long Term: Be able to perform more ADLs without symptoms or delay the onset of symptoms             Core Components/Risk Factors/Patient Goals Review:    Core Components/Risk Factors/Patient Goals at Discharge (Final Review):    ITP Comments:   Comments:

## 2021-09-19 NOTE — Progress Notes (Addendum)
Kelly Mooney 75 y.o. female  Initial Psychosocial Assessment  Pt psychosocial assessment reveals pt lives alone. Pt is currently retired. Pt hobbies include sewing, doing crossword puzzles, watching tv and spending time with her grandchildren and great grandchildren. Pt reports no stress. Pt does not exhibit signs of depression. Pt shows good  coping skills with positive outlook . [name] is enthusiastic to start Pulmonary Rehab as her husband completed the program in the past. She believes Pulmonary Rehab will give her the motivation she needs to get back into exercising. Offered emotional support and reassurance. Will continue to monitor.   09/19/2021 2:39 PM

## 2021-09-23 ENCOUNTER — Encounter (HOSPITAL_COMMUNITY): Admission: RE | Admit: 2021-09-23 | Payer: Medicare Other | Source: Ambulatory Visit

## 2021-09-23 ENCOUNTER — Telehealth (HOSPITAL_COMMUNITY): Payer: Self-pay

## 2021-09-23 DIAGNOSIS — J449 Chronic obstructive pulmonary disease, unspecified: Secondary | ICD-10-CM

## 2021-09-23 NOTE — Telephone Encounter (Signed)
Called and left pt a voicemail to let her know the start date has been pushed to Thursday.

## 2021-09-25 ENCOUNTER — Encounter (HOSPITAL_COMMUNITY)
Admission: RE | Admit: 2021-09-25 | Discharge: 2021-09-25 | Disposition: A | Discharge status: Home / Self Care | Payer: Medicare Other | Source: Ambulatory Visit | Attending: Internal Medicine | Admitting: Internal Medicine

## 2021-09-25 ENCOUNTER — Other Ambulatory Visit: Payer: Self-pay

## 2021-09-25 ENCOUNTER — Telehealth (HOSPITAL_COMMUNITY): Payer: Self-pay | Admitting: *Deleted

## 2021-09-25 VITALS — Wt 161.4 lb

## 2021-09-25 DIAGNOSIS — J449 Chronic obstructive pulmonary disease, unspecified: Secondary | ICD-10-CM

## 2021-09-25 LAB — GLUCOSE, CAPILLARY
Glucose-Capillary: 129 mg/dL — ABNORMAL HIGH (ref 70–99)
Glucose-Capillary: 128 mg/dL — ABNORMAL HIGH (ref 70–99)

## 2021-09-25 NOTE — Progress Notes (Signed)
Daily Session Note  Patient Details  Name: Kelly Mooney MRN: [id number] Date of Birth: September 18, 1946 Referring Provider:   [name] Pulmonary Rehab Walk Test from 09/19/2021 in [place]  Referring Provider [name]       Encounter Date: 09/25/2021  Check In:  Session Check In - 09/25/21 1122       Check-In   Supervising physician immediately available to respond to emergencies [facility] immediately available    Physician(s) Dr. Broadus John    Location [facility]    Staff Present Rosebud Poles, RN, Quentin Ore, MS, ACSM-CEP, Exercise Physiologist;Takoda Siedlecki Ysidro Evert, RN    Virtual Visit No    Medication changes reported     No    Fall or balance concerns reported    No    Tobacco Cessation No Change    Warm-up and Cool-down Performed as group-led instruction    Resistance Training Performed Yes    VAD Patient? No    PAD/SET Patient? No      Pain Assessment   Currently in Pain? No/denies    Multiple Pain Sites No             Capillary Blood Glucose: Results for orders placed or performed during the hospital encounter of 09/25/21 (from the past 24 hour(s))  Glucose, capillary     Status: Abnormal   Collection Time: 09/25/21 10:17 AM  Result Value Ref Range   Glucose-Capillary 129 (H) 70 - 99 mg/dL  Glucose, capillary     Status: Abnormal   Collection Time: 09/25/21 11:08 AM  Result Value Ref Range   Glucose-Capillary 128 (H) 70 - 99 mg/dL      Social History   Tobacco Use  Smoking Status Former   Packs/day: 1.50   Years: 10.00   Pack years: 15.00   Types: Cigarettes   Quit date: 10/02/1996   Years since quitting: 24.9  Smokeless Tobacco Never  Tobacco Comments   quit 1997    Goals Met:  Exercise tolerated well No report of concerns or symptoms today Strength training completed today  Goals Unmet:  Not Applicable  Comments: Service time is from 1020 to [place]    Dr. Rodman Pickle is Medical  Director for Pulmonary Rehab at [facility].

## 2021-09-30 ENCOUNTER — Encounter (HOSPITAL_COMMUNITY)
Admission: RE | Admit: 2021-09-30 | Discharge: 2021-09-30 | Disposition: A | Discharge status: Home / Self Care | Payer: Medicare Other | Source: Ambulatory Visit | Attending: Internal Medicine | Admitting: Internal Medicine

## 2021-09-30 ENCOUNTER — Other Ambulatory Visit: Payer: Self-pay

## 2021-09-30 VITALS — Wt 159.2 lb

## 2021-09-30 DIAGNOSIS — J449 Chronic obstructive pulmonary disease, unspecified: Secondary | ICD-10-CM

## 2021-09-30 LAB — GLUCOSE, CAPILLARY
Glucose-Capillary: 275 mg/dL — ABNORMAL HIGH (ref 70–99)
Glucose-Capillary: 194 mg/dL — ABNORMAL HIGH (ref 70–99)

## 2021-09-30 NOTE — Progress Notes (Signed)
Daily Session Note  Patient Details  Name: Kelly Mooney MRN: [id number] Date of Birth: 1946/09/29 Referring Provider:   [name] Pulmonary Rehab Walk Test from 09/19/2021 in [place]  Referring Provider [name]       Encounter Date: 09/30/2021  Check In:  Session Check In - 09/30/21 1126       Check-In   Supervising physician immediately available to respond to emergencies [facility] immediately available    Physician(s) Dr. Broadus John    Location [facility]    Staff Present Rosebud Poles, RN, Quentin Ore, MS, ACSM-CEP, Exercise Physiologist;Erhardt Dada Ysidro Evert, RN    Virtual Visit No    Medication changes reported     No    Fall or balance concerns reported    No    Tobacco Cessation No Change    Warm-up and Cool-down Performed as group-led instruction    Resistance Training Performed Yes    VAD Patient? No    PAD/SET Patient? No      Pain Assessment   Currently in Pain? No/denies    Multiple Pain Sites No             Capillary Blood Glucose: Results for orders placed or performed during the hospital encounter of 09/30/21 (from the past 24 hour(s))  Glucose, capillary     Status: Abnormal   Collection Time: 09/30/21 10:23 AM  Result Value Ref Range   Glucose-Capillary 275 (H) 70 - 99 mg/dL  Glucose, capillary     Status: Abnormal   Collection Time: 09/30/21 11:11 AM  Result Value Ref Range   Glucose-Capillary 194 (H) 70 - 99 mg/dL    POCT Glucose - 09/30/21 1151       POCT Blood Glucose   Pre-Exercise 275 mg/dL    Post-Exercise 194 mg/dL             Exercise Prescription Changes - 09/30/21 1100       Response to Exercise   Blood Pressure (Admit) 142/66    Blood Pressure (Exercise) 140/60    Blood Pressure (Exit) 124/60    Heart Rate (Admit) 90 bpm    Heart Rate (Exercise) 110 bpm    Heart Rate (Exit) 100 bpm    Oxygen Saturation (Admit) 98 %    Oxygen Saturation (Exercise) 94 %     Oxygen Saturation (Exit) 95 %    Rating of Perceived Exertion (Exercise) 9    Perceived Dyspnea (Exercise) 1    Duration Continue with 30 min of aerobic exercise without signs/symptoms of physical distress.    Intensity Other (comment)   40-80% of HRR     Progression   Progression Continue to progress workloads to maintain intensity without signs/symptoms of physical distress.      Resistance Training   Training Prescription Yes    Weight Blue bands    Reps 10-15    Time 12 Minutes      Treadmill   MPH 2.2    Grade 0    Minutes 15      NuStep   Level 4    SPM 80    Minutes 15    METs 2.4             Social History   Tobacco Use  Smoking Status Former   Packs/day: 1.50   Years: 10.00   Pack years: 15.00   Types: Cigarettes   Quit date: 10/02/1996   Years since quitting: 25.0  Smokeless  Tobacco Never  Tobacco Comments   quit 1997    Goals Met:  Exercise tolerated well No report of concerns or symptoms today Strength training completed today  Goals Unmet:  Not Applicable  Comments: Service time is from 1028 to 1135    Dr. Rodman Pickle is Medical Director for Pulmonary Rehab at [facility].

## 2021-10-02 ENCOUNTER — Encounter (HOSPITAL_COMMUNITY)
Admission: RE | Admit: 2021-10-02 | Discharge: 2021-10-02 | Disposition: A | Discharge status: Home / Self Care | Payer: Medicare Other | Source: Ambulatory Visit | Attending: Internal Medicine | Admitting: Internal Medicine

## 2021-10-02 ENCOUNTER — Other Ambulatory Visit: Payer: Self-pay

## 2021-10-02 DIAGNOSIS — J449 Chronic obstructive pulmonary disease, unspecified: Secondary | ICD-10-CM | POA: Diagnosis not present

## 2021-10-02 LAB — GLUCOSE, CAPILLARY
Glucose-Capillary: 168 mg/dL — ABNORMAL HIGH (ref 70–99)
Glucose-Capillary: 129 mg/dL — ABNORMAL HIGH (ref 70–99)

## 2021-10-02 NOTE — Progress Notes (Signed)
Daily Session Note  Patient Details  Name: Kelly Mooney MRN: [id number] Date of Birth: 1946/02/24 Referring Provider:   [name] Pulmonary Rehab Walk Test from 09/19/2021 in [place]  Referring Provider [name]       Encounter Date: 10/02/2021  Check In:  Session Check In - 10/02/21 1134       Check-In   Supervising physician immediately available to respond to emergencies [facility] immediately available    Physician(s) Dr. Cathlean Sauer    [facility]    Staff Present Rosebud Poles, RN, BSN;Lisa Ysidro Evert, Cathleen Fears, MS, ACSM-CEP, Exercise Physiologist    Virtual Visit No    Medication changes reported     No    Fall or balance concerns reported    No    Tobacco Cessation No Change    Warm-up and Cool-down Performed as group-led instruction    Resistance Training Performed Yes    VAD Patient? No    PAD/SET Patient? No      Pain Assessment   Currently in Pain? No/denies    Multiple Pain Sites No             Capillary Blood Glucose: Results for orders placed or performed during the hospital encounter of 09/30/21 (from the past 24 hour(s))  Glucose, capillary     Status: Abnormal   Collection Time: 10/02/21 10:11 AM  Result Value Ref Range   Glucose-Capillary 168 (H) 70 - 99 mg/dL  Glucose, capillary     Status: Abnormal   Collection Time: 10/02/21 11:17 AM  Result Value Ref Range   Glucose-Capillary 129 (H) 70 - 99 mg/dL      Social History   Tobacco Use  Smoking Status Former   Packs/day: 1.50   Years: 10.00   Pack years: 15.00   Types: Cigarettes   Quit date: 10/02/1996   Years since quitting: 25.0  Smokeless Tobacco Never  Tobacco Comments   quit 1997    Goals Met:  Proper associated with RPD/PD & O2 Sat Independence with exercise equipment Exercise tolerated well No report of concerns or symptoms today Strength training completed today  Goals Unmet:  Not  Applicable  Comments: Service time is from 1022 to 1130.    Dr. Rodman Pickle is Medical Director for Pulmonary Rehab at [facility].

## 2021-10-07 ENCOUNTER — Telehealth (HOSPITAL_COMMUNITY): Payer: Self-pay | Admitting: Family Medicine

## 2021-10-07 ENCOUNTER — Encounter (HOSPITAL_COMMUNITY): Payer: Medicare Other

## 2021-10-07 ENCOUNTER — Telehealth (HOSPITAL_COMMUNITY): Payer: Self-pay | Admitting: *Deleted

## 2021-10-07 NOTE — Telephone Encounter (Signed)
Patient given detailed instructions per Myocardial Perfusion Study Information Sheet for the test on 10/15/21 at 1030. Patient notified to arrive 15 minutes early and that it is imperative to arrive on time for appointment to keep from having the test rescheduled.  If you need to cancel or reschedule your appointment, please call the office within 24 hours of your appointment. . Patient verbalized understanding.Joby Hershkowitz, Ranae Palms Patient has instructions.

## 2021-10-14 ENCOUNTER — Encounter (HOSPITAL_COMMUNITY)
Admission: RE | Admit: 2021-10-14 | Discharge: 2021-10-14 | Disposition: A | Discharge status: Home / Self Care | Payer: Medicare Other | Source: Ambulatory Visit | Attending: Internal Medicine | Admitting: Internal Medicine

## 2021-10-14 ENCOUNTER — Other Ambulatory Visit: Payer: Self-pay

## 2021-10-14 VITALS — Wt 158.5 lb

## 2021-10-14 DIAGNOSIS — J449 Chronic obstructive pulmonary disease, unspecified: Secondary | ICD-10-CM | POA: Diagnosis not present

## 2021-10-14 NOTE — Progress Notes (Signed)
Daily Session Note  Patient Details  Name: Kelly Mooney MRN: [id number] Date of Birth: [birth year] Referring Provider:   [name] Pulmonary Rehab Walk Test from 09/19/2021 in [place]  Referring Provider [name]       Encounter Date: 10/14/2021  Check In:  Session Check In - 10/14/21 1123       Check-In   Supervising physician immediately available to respond to emergencies [facility] immediately available    Physician(s) Dr. Broadus John    [facility]    Staff Present Rosebud Poles, RN, BSN;Taisha Pennebaker Ysidro Evert, Cathleen Fears, MS, ACSM-CEP, Exercise Physiologist    Virtual Visit No    Medication changes reported     No    Fall or balance concerns reported    No    Tobacco Cessation No Change    Warm-up and Cool-down Performed as group-led instruction    Resistance Training Performed Yes    VAD Patient? No    PAD/SET Patient? No      Pain Assessment   Currently in Pain? No/denies    Multiple Pain Sites No             Capillary Blood Glucose: No results found for this or any previous visit (from the past 24 hour(s)).  POCT Glucose - 10/14/21 1150       POCT Blood Glucose   Pre-Exercise 139 mg/dL    Post-Exercise 91 mg/dL             Exercise Prescription Changes - 10/14/21 1100       Response to Exercise   Blood Pressure (Admit) 122/66    Blood Pressure (Exercise) 130/56    Blood Pressure (Exit) 120/50    Heart Rate (Admit) 85 bpm    Heart Rate (Exercise) 100 bpm    Heart Rate (Exit) 82 bpm    Oxygen Saturation (Admit) 100 %    Oxygen Saturation (Exercise) 97 %    Oxygen Saturation (Exit) 98 %    Rating of Perceived Exertion (Exercise) 13    Perceived Dyspnea (Exercise) 3    Duration Continue with 30 min of aerobic exercise without signs/symptoms of physical distress.    Intensity THRR unchanged      Progression   Progression Continue to progress workloads to maintain intensity without  signs/symptoms of physical distress.      Resistance Training   Training Prescription Yes    Weight Blue bands    Reps 10-15    Time 10 Minutes      Treadmill   MPH 1.8    Grade 0    Minutes 15      NuStep   Level 3    SPM 60    Minutes 15    METs 1.5             Social History   Tobacco Use  Smoking Status Former   Packs/day: 1.50   Years: 10.00   Pack years: 15.00   Types: Cigarettes   Quit date: 10/02/1996   Years since quitting: 25.0  Smokeless Tobacco Never  Tobacco Comments   quit 1997    Goals Met:  Exercise tolerated well No report of concerns or symptoms today Strength training completed today  Goals Unmet:  Not Applicable  Comments: Service time is from 1020 to 1135 Pt was out last week with cold and congestion. She decreased her workload on the TM and the nu step, feeling weak and washed out  today.   Dr. Rodman Pickle is Medical Director for Pulmonary Rehab at [facility].

## 2021-10-15 ENCOUNTER — Ambulatory Visit (HOSPITAL_BASED_OUTPATIENT_CLINIC_OR_DEPARTMENT_OTHER): Payer: Medicare Other

## 2021-10-15 ENCOUNTER — Encounter: Payer: Self-pay | Admitting: Cardiology

## 2021-10-15 ENCOUNTER — Ambulatory Visit (HOSPITAL_COMMUNITY): Payer: Medicare Other | Attending: Cardiology

## 2021-10-15 ENCOUNTER — Telehealth: Payer: Self-pay

## 2021-10-15 ENCOUNTER — Ambulatory Visit (INDEPENDENT_AMBULATORY_CARE_PROVIDER_SITE_OTHER)
Admission: RE | Admit: 2021-10-15 | Discharge: 2021-10-15 | Disposition: A | Discharge status: Home / Self Care | Payer: Self-pay | Source: Ambulatory Visit | Attending: Cardiology | Admitting: Cardiology

## 2021-10-15 ENCOUNTER — Other Ambulatory Visit: Payer: Self-pay | Admitting: Internal Medicine

## 2021-10-15 DIAGNOSIS — R0609 Other forms of dyspnea: Secondary | ICD-10-CM | POA: Diagnosis not present

## 2021-10-15 DIAGNOSIS — I10 Essential (primary) hypertension: Secondary | ICD-10-CM | POA: Diagnosis not present

## 2021-10-15 DIAGNOSIS — I517 Cardiomegaly: Secondary | ICD-10-CM

## 2021-10-15 DIAGNOSIS — R931 Abnormal findings on diagnostic imaging of heart and coronary circulation: Secondary | ICD-10-CM

## 2021-10-15 LAB — MYOCARDIAL PERFUSION IMAGING
LV dias vol: 65 mL (ref 46–106)
LV sys vol: 24 mL
Nuc Stress EF: 63 %
Peak HR: 92 {beats}/min
Rest HR: 79 {beats}/min
Rest Nuclear Isotope Dose: 8.1 mCi
SDS: 0
SRS: 2
SSS: 2
ST Depression (mm): 0 mm
Stress Nuclear Isotope Dose: 29.7 mCi
TID: 0.84

## 2021-10-15 LAB — ECHOCARDIOGRAM COMPLETE
Area-P 1/2: 3.37 cm2
S' Lateral: 2.9 cm

## 2021-10-15 MED ORDER — REGADENOSON 0.4 MG/5ML IV SOLN
0.4000 mg | Freq: Once | INTRAVENOUS | Status: AC
  Administered 2021-10-15: 0.4 mg via INTRAVENOUS

## 2021-10-15 MED ORDER — TECHNETIUM TC 99M TETROFOSMIN IV KIT
29.7000 | PACK | Freq: Once | INTRAVENOUS | Status: AC | PRN
  Administered 2021-10-15: 29.7 via INTRAVENOUS
  Filled 2021-10-15: qty 30

## 2021-10-15 MED ORDER — ASPIRIN EC 81 MG PO TBEC: 81.0000 mg | DELAYED_RELEASE_TABLET | Freq: Every day | ORAL | 3 refills | Status: AC

## 2021-10-15 MED ORDER — TECHNETIUM TC 99M TETROFOSMIN IV KIT
8.1000 | PACK | Freq: Once | INTRAVENOUS | Status: AC | PRN
  Administered 2021-10-15: 8.1 via INTRAVENOUS
  Filled 2021-10-15: qty 9

## 2021-10-15 NOTE — Telephone Encounter (Signed)
The patient has been notified of the result and verbalized understanding.  All questions (if any) were answered. Antonieta Iba, RN 10/15/2021 4:37 PM

## 2021-10-15 NOTE — Telephone Encounter (Signed)
-----   Message from Sueanne Margarita, MD sent at 10/15/2021  3:58 PM EST ----- Coronary CT showed an elevated coronary calcium score of 462 which is 83rd percentile for age race and sex matched controls.  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  Recent Lexiscan Myoview showed no ischemia.  She needs aggressive risk factor modification.  Her come in for an FLP and ALT.  Aspirin 81 mg daily

## 2021-10-16 ENCOUNTER — Encounter (HOSPITAL_COMMUNITY)
Admission: RE | Admit: 2021-10-16 | Discharge: 2021-10-16 | Disposition: A | Discharge status: Home / Self Care | Payer: Medicare Other | Source: Ambulatory Visit | Attending: Internal Medicine | Admitting: Internal Medicine

## 2021-10-16 ENCOUNTER — Other Ambulatory Visit: Payer: Self-pay

## 2021-10-16 DIAGNOSIS — J449 Chronic obstructive pulmonary disease, unspecified: Secondary | ICD-10-CM | POA: Diagnosis not present

## 2021-10-16 NOTE — Progress Notes (Signed)
Daily Session Note  Patient Details  Name: Kelly Mooney MRN: [id number] Date of Birth: 1946-08-15 Referring Provider:   [name] Pulmonary Rehab Walk Test from 09/19/2021 in [place]  Referring Provider [name]       Encounter Date: 10/16/2021  Check In:  Session Check In - 10/16/21 1128       Check-In   Supervising physician immediately available to respond to emergencies [facility] immediately available    Physician(s) Dr. Starla Link    Location [facility]    Staff Present Rosebud Poles, RN, BSN;Tarrin Menn Ysidro Evert, Cathleen Fears, MS, ACSM-CEP, Exercise Physiologist    Virtual Visit No    Medication changes reported     No    Fall or balance concerns reported    No    Tobacco Cessation No Change    Warm-up and Cool-down Performed as group-led instruction    Resistance Training Performed Yes    VAD Patient? No    PAD/SET Patient? No      Pain Assessment   Currently in Pain? No/denies    Multiple Pain Sites No             Capillary Blood Glucose: No results found for this or any previous visit (from the past 24 hour(s)).    Social History   Tobacco Use  Smoking Status Former   Packs/day: 1.50   Years: 10.00   Pack years: 15.00   Types: Cigarettes   Quit date: 10/02/1996   Years since quitting: 25.0  Smokeless Tobacco Never  Tobacco Comments   quit 1997    Goals Met:  Exercise tolerated well No report of concerns or symptoms today Strength training completed today  Goals Unmet:  Not Applicable  Comments: Service time is from 1015 to [place]    Dr. Rodman Pickle is Medical Director for Pulmonary Rehab at [facility].

## 2021-10-21 ENCOUNTER — Encounter: Payer: Self-pay | Admitting: Internal Medicine

## 2021-10-21 ENCOUNTER — Ambulatory Visit (INDEPENDENT_AMBULATORY_CARE_PROVIDER_SITE_OTHER): Payer: Medicare Other | Admitting: Internal Medicine

## 2021-10-21 ENCOUNTER — Encounter (HOSPITAL_COMMUNITY)
Admission: RE | Admit: 2021-10-21 | Discharge: 2021-10-21 | Disposition: A | Discharge status: Home / Self Care | Payer: Medicare Other | Source: Ambulatory Visit | Attending: Internal Medicine | Admitting: Internal Medicine

## 2021-10-21 ENCOUNTER — Other Ambulatory Visit: Payer: Medicare Other | Admitting: *Deleted

## 2021-10-21 ENCOUNTER — Other Ambulatory Visit: Payer: Self-pay

## 2021-10-21 VITALS — BP 140/76 | HR 78 | Ht 62.0 in | Wt 159.8 lb

## 2021-10-21 DIAGNOSIS — J449 Chronic obstructive pulmonary disease, unspecified: Secondary | ICD-10-CM | POA: Diagnosis not present

## 2021-10-21 DIAGNOSIS — R931 Abnormal findings on diagnostic imaging of heart and coronary circulation: Secondary | ICD-10-CM

## 2021-10-21 DIAGNOSIS — J310 Chronic rhinitis: Secondary | ICD-10-CM | POA: Diagnosis not present

## 2021-10-21 LAB — GLUCOSE, CAPILLARY
Glucose-Capillary: 132 mg/dL — ABNORMAL HIGH (ref 70–99)
Glucose-Capillary: 131 mg/dL — ABNORMAL HIGH (ref 70–99)

## 2021-10-21 MED ORDER — TRELEGY ELLIPTA 200-62.5-25 MCG/ACT IN AEPB: 1.0000 | INHALATION_SPRAY | Freq: Every day | RESPIRATORY_TRACT | 3 refills | Status: DC

## 2021-10-21 NOTE — Progress Notes (Signed)
Pulmonary Individual Treatment Plan  Patient Details  Name: Kelly Mooney MRN: [id number] Date of Birth: 1946-03-15 Referring Provider:   [name] Pulmonary Rehab Walk Test from 09/19/2021 in [place]  Referring Provider [name]       Initial Encounter Date:  Flowsheet Row Pulmonary Rehab Walk Test from 09/19/2021 in [place]  Date 09/19/21       Visit Diagnosis: COPD with chronic bronchitis and emphysema ([place])  Patient's Home Medications on Admission:   Current Outpatient Medications:    albuterol (PROVENTIL HFA;VENTOLIN HFA) 108 (90 BASE) MCG/ACT inhaler, Inhale 2 puffs into the lungs 4 (four) times daily. , Disp: , Rfl:    albuterol (PROVENTIL) (2.5 MG/3ML) 0.083% nebulizer solution, Take 2.5 mg by nebulization every 6 (six) hours as needed., Disp: , Rfl:    amLODipine (NORVASC) 10 MG tablet, Take 10 mg by mouth daily with lunch. , Disp: , Rfl:    aspirin EC 81 MG tablet, Take 1 tablet (81 mg total) by mouth daily. Swallow whole., Disp: 90 tablet, Rfl: 3   atorvastatin (LIPITOR) 20 MG tablet, Take 1 tablet (20 mg total) by mouth daily., Disp: 90 tablet, Rfl: 3   benzonatate (TESSALON) 200 MG capsule, Take 200 mg by mouth 3 (three) times daily., Disp: , Rfl:    chlorthalidone (HYGROTON) 25 MG tablet, Take 25 mg by mouth daily. , Disp: , Rfl:    clobetasol cream (TEMOVATE) 9.47 %, Apply 1 application topically daily. Apply a thin layer to affected areas, Disp: , Rfl: 4   doxycycline (VIBRAMYCIN) 100 MG capsule, Take 1 capsule (100 mg total) by mouth 2 (two) times daily. (Patient not taking: Reported on 09/19/2021), Disp: 20 capsule, Rfl: 0   fenofibrate 160 MG tablet, Take 160 mg by mouth daily., Disp: , Rfl:    fluocinonide (LIDEX) 0.05 % external solution, Apply 1 application topically See admin instructions. Apply to affected areas daily as needed for itching (Patient not taking: No sig reported), Disp: , Rfl:     fluorometholone (FML) 0.1 % ophthalmic suspension, fluorometholone 0.1 % eye drops,suspension (Patient not taking: Reported on 09/19/2021), Disp: , Rfl:    gabapentin (NEURONTIN) 100 MG capsule, TAKE 1-3 CAPSULES BY MOUTH UP TO THREE TIMES DAILY (Patient not taking: Reported on 09/17/2021), Disp: , Rfl:    glipiZIDE (GLUCOTROL) 5 MG tablet, Take 5 mg by mouth 2 (two) times daily., Disp: , Rfl:    hydrOXYzine (ATARAX/VISTARIL) 25 MG tablet, Take 25 mg by mouth every 8 (eight) hours as needed., Disp: , Rfl:    levofloxacin (LEVAQUIN) 500 MG tablet, Take 500 mg by mouth daily. (Patient not taking: Reported on 09/17/2021), Disp: , Rfl:    levothyroxine (SYNTHROID, LEVOTHROID) 137 MCG tablet, Take 137 mcg by mouth daily before breakfast., Disp: , Rfl:    mometasone (ELOCON) 0.1 % cream, Apply 1 application topically every other day. , Disp: , Rfl:    montelukast (SINGULAIR) 10 MG tablet, Take 10 mg by mouth daily. , Disp: , Rfl: 4   Multiple Vitamins-Minerals (MULTIVITAMIN WITH MINERALS) tablet, Take 2 tablets by mouth daily. Gummie (Patient not taking: Reported on 09/19/2021), Disp: , Rfl:    oxyCODONE (OXY IR/ROXICODONE) 5 MG immediate release tablet, Take 1-2 tablets (5-10 mg total) by mouth every 6 (six) hours as needed for moderate pain, severe pain or breakthrough pain. (Patient not taking: Reported on 09/17/2021), Disp: 30 tablet, Rfl: 0   pantoprazole (PROTONIX) 40 MG tablet, TAKE  1 TABLET (40 MG TOTAL) BY MOUTH DAILY., Disp: 30 tablet, Rfl: 3   pioglitazone (ACTOS) 15 MG tablet, Take 15 mg by mouth daily., Disp: , Rfl:    quinapril (ACCUPRIL) 40 MG tablet, Take 40 mg by mouth daily. , Disp: , Rfl:    Soft Lens Products (B & L SENSITIVE EYES SALINE) 0.4 % SOLN, Place 1 drop into both eyes daily as needed (Dry eye)., Disp: , Rfl:    TRELEGY ELLIPTA 200-62.5-25 MCG/ACT AEPB, INHALE 1 PUFF INTO THE LUNGS DAILY, Disp: 60 each, Rfl: 4   valACYclovir (VALTREX) 1000 MG tablet, Take 2 tablets by mouth as  needed (Cold Sore). , Disp: , Rfl:   Past Medical History: Past Medical History:  Diagnosis Date   Agatston coronary artery calcium score greater than 400    462 on coronary calcium score 09/2021   Arthritis    Asthma    since age 53   Cataract    Colon polyps    Complication of anesthesia    slow to wake up    COPD (chronic obstructive pulmonary disease) ([place])    COVID-19 09/2019   Diabetes mellitus    Diverticulosis    Dyspnea    with exertoin due to asthma    Esophageal stricture    Family history of adverse reaction to anesthesia    mother , daughter and son slow to wake up and  problems with N/V    GERD (gastroesophageal reflux disease)    Hemorrhoids    Hiatal hernia    History of kidney stones    Hypercholesteremia    Hypertension    Hypothyroidism    IBS (irritable bowel syndrome)    Paraesophageal hernia    PONV (postoperative nausea and vomiting)    Stroke (HCC)    no deficits    Tubular adenoma of colon     Tobacco Use: Social History   Tobacco Use  Smoking Status Former   Packs/day: 1.50   Years: 10.00   Pack years: 15.00   Types: Cigarettes   Quit date: 10/02/1996   Years since quitting: 25.0  Smokeless Tobacco Never  Tobacco Comments   quit 1997    Labs: Recent Review Flowsheet Data     Labs for ITP Cardiac and Pulmonary Rehab Latest Ref Rng & Units 07/05/2018 07/06/2018 09/26/2018 10/02/2019 07/26/2020   Cholestrol 0 - 200 mg/dL - 139 - - -   LDLCALC 0 - 99 mg/dL - 51 - - -   HDL >40 mg/dL - 35(L) - - -   Trlycerides <150 mg/dL - 266(H) - 168(H) -   Hemoglobin A1c 4.8 - 5.6 % - 6.4(H) - - 6.3(H)   TCO2 22 - 32 mmol/L 25 - 28 - -       Capillary Blood Glucose: Lab Results  Component Value Date   GLUCAP 129 (H) 10/02/2021   GLUCAP 168 (H) 10/02/2021   GLUCAP 194 (H) 09/30/2021   GLUCAP 275 (H) 09/30/2021   GLUCAP 128 (H) 09/25/2021    POCT Glucose     Row Name 09/30/21 1151 10/14/21 1150           POCT Blood Glucose    Pre-Exercise 275 mg/dL 139 mg/dL      Post-Exercise 194 mg/dL 91 mg/dL               Pulmonary Assessment Scores:  Pulmonary Assessment Scores     Row Name 09/19/21 1422  ADL UCSD   ADL Phase Entry     SOB Score total 35       CAT Score   CAT Score 20       mMRC Score   mMRC Score 4             UCSD: Self-administered rating of dyspnea associated with activities of daily living (ADLs) 6-point scale (0 = "not at all" to 5 = "maximal or unable to do because of breathlessness")  Scoring Scores range from 0 to 120.  Minimally important difference is 5 units  CAT: CAT can identify the health impairment of COPD patients and is better correlated with disease progression.  CAT has a scoring range of zero to 40. The CAT score is classified into four groups of low (less than 10), medium (10 - 20), high (21-30) and very high (31-40) based on the impact level of disease on health status. A CAT score over 10 suggests significant symptoms.  A worsening CAT score could be explained by an exacerbation, poor medication adherence, poor inhaler technique, or progression of COPD or comorbid conditions.  CAT MCID is 2 points  mMRC: mMRC (Modified Medical Research Council) Dyspnea Scale is used to assess the degree of baseline functional disability in patients of respiratory disease due to dyspnea. No minimal important difference is established. A decrease in score of 1 point or greater is considered a positive change.   Pulmonary Function Assessment:  Pulmonary Function Assessment - 09/19/21 1425       Breath   Bilateral Breath Sounds Clear    Shortness of Breath No             Exercise Target Goals: Exercise Program Goal: Individual exercise prescription set using results from initial 6 min walk test and THRR while considering  patient's activity barriers and safety.   Exercise Prescription Goal: Initial exercise prescription builds to 30-45 minutes a day of aerobic  activity, 2-3 days per week.  Home exercise guidelines will be given to patient during program as part of exercise prescription that the participant will acknowledge.  Activity Barriers & Risk Stratification:  Activity Barriers & Cardiac Risk Stratification - 09/19/21 1105       Activity Barriers & Cardiac Risk Stratification   Activity Barriers Arthritis;Deconditioning;Muscular Weakness;Shortness of Breath    Cardiac Risk Stratification Low             6 Minute Walk:  6 Minute Walk     Row Name 09/19/21 1351         6 Minute Walk   Phase Initial     Distance 1518 feet     Walk Time 6 minutes     # of Rest Breaks 0     MPH 2.88     METS 2.91     RPE 11     Perceived Dyspnea  1     VO2 Peak 10.2     Symptoms No     Resting HR 86 bpm     Resting BP 120/66     Resting Oxygen Saturation  98 %     Exercise Oxygen Saturation  during 6 min walk 98 %     Max Ex. HR 109 bpm     Max Ex. BP 138/70     2 Minute Post BP 130/70       Interval HR   1 Minute HR 92     2 Minute HR 99     3 Minute  HR 105     4 Minute HR 108     5 Minute HR 106     6 Minute HR 109     2 Minute Post HR 89     Interval Heart Rate? Yes       Interval Oxygen   Interval Oxygen? Yes     Baseline Oxygen Saturation % 98 %     1 Minute Oxygen Saturation % 99 %     1 Minute Liters of Oxygen 0 L     2 Minute Oxygen Saturation % 98 %     2 Minute Liters of Oxygen 0 L     3 Minute Oxygen Saturation % 98 %     3 Minute Liters of Oxygen 0 L     4 Minute Oxygen Saturation % 99 %     4 Minute Liters of Oxygen 0 L     5 Minute Oxygen Saturation % 99 %     5 Minute Liters of Oxygen 0 L     6 Minute Oxygen Saturation % 98 %     6 Minute Liters of Oxygen 0 L     2 Minute Post Oxygen Saturation % 99 %     2 Minute Post Liters of Oxygen 0 L              Oxygen Initial Assessment:  Oxygen Initial Assessment - 09/19/21 1108       Home Oxygen   Home Oxygen Device None    Sleep Oxygen Prescription  None    Home Exercise Oxygen Prescription None    Home Resting Oxygen Prescription None      Initial 6 min Walk   Oxygen Used None      Program Oxygen Prescription   Program Oxygen Prescription None      Intervention   Short Term Goals To learn and exhibit compliance with exercise, home and travel O2 prescription;To learn and understand importance of monitoring SPO2 with pulse oximeter and demonstrate accurate use of the pulse oximeter.;To learn and understand importance of maintaining oxygen saturations>88%;To learn and demonstrate proper pursed lip breathing techniques or other breathing techniques. ;To learn and demonstrate proper use of respiratory medications    Long  Term Goals Exhibits compliance with exercise, home  and travel O2 prescription;Verbalizes importance of monitoring SPO2 with pulse oximeter and return demonstration;Maintenance of O2 saturations>88%;Exhibits proper breathing techniques, such as pursed lip breathing or other method taught during program session;Compliance with respiratory medication;Demonstrates proper use of MDI's             Oxygen Re-Evaluation:  Oxygen Re-Evaluation     Row Name 10/13/21 0920             Program Oxygen Prescription   Program Oxygen Prescription None         Home Oxygen   Home Oxygen Device None       Sleep Oxygen Prescription None       Home Exercise Oxygen Prescription None       Home Resting Oxygen Prescription None         Goals/Expected Outcomes   Short Term Goals To learn and exhibit compliance with exercise, home and travel O2 prescription;To learn and understand importance of monitoring SPO2 with pulse oximeter and demonstrate accurate use of the pulse oximeter.;To learn and understand importance of maintaining oxygen saturations>88%;To learn and demonstrate proper pursed lip breathing techniques or other breathing techniques. ;To learn and demonstrate proper use of respiratory  medications       Long  Term Goals  Exhibits compliance with exercise, home  and travel O2 prescription;Verbalizes importance of monitoring SPO2 with pulse oximeter and return demonstration;Maintenance of O2 saturations>88%;Exhibits proper breathing techniques, such as pursed lip breathing or other method taught during program session;Compliance with respiratory medication;Demonstrates proper use of MDI's       Goals/Expected Outcomes Compliance and understanding of oxygen saturation monitoring and breathing techniques to decrease shortness of breath.                Oxygen Discharge (Final Oxygen Re-Evaluation):  Oxygen Re-Evaluation - 10/13/21 0920       Program Oxygen Prescription   Program Oxygen Prescription None      Home Oxygen   Home Oxygen Device None    Sleep Oxygen Prescription None    Home Exercise Oxygen Prescription None    Home Resting Oxygen Prescription None      Goals/Expected Outcomes   Short Term Goals To learn and exhibit compliance with exercise, home and travel O2 prescription;To learn and understand importance of monitoring SPO2 with pulse oximeter and demonstrate accurate use of the pulse oximeter.;To learn and understand importance of maintaining oxygen saturations>88%;To learn and demonstrate proper pursed lip breathing techniques or other breathing techniques. ;To learn and demonstrate proper use of respiratory medications    Long  Term Goals Exhibits compliance with exercise, home  and travel O2 prescription;Verbalizes importance of monitoring SPO2 with pulse oximeter and return demonstration;Maintenance of O2 saturations>88%;Exhibits proper breathing techniques, such as pursed lip breathing or other method taught during program session;Compliance with respiratory medication;Demonstrates proper use of MDI's    Goals/Expected Outcomes Compliance and understanding of oxygen saturation monitoring and breathing techniques to decrease shortness of breath.             Initial Exercise  Prescription:  Initial Exercise Prescription - 09/19/21 1300       Date of Initial Exercise RX and Referring Provider   Date 09/19/21    Referring Provider [name]    Expected Discharge Date 11/20/21      Treadmill   MPH 2.5    Grade 0    Minutes 15    METs 2.91      NuStep   Level 1    SPM 70    Minutes 15      Prescription Details   Frequency (times per week) 2    Duration Progress to 30 minutes of continuous aerobic without signs/symptoms of physical distress      Intensity   THRR 40-80% of Max Heartrate 76-115    Ratings of Perceived Exertion 11-13    Perceived Dyspnea 0-4      Progression   Progression Continue progressive overload as per policy without signs/symptoms or physical distress.      Resistance Training   Training Prescription Yes    Weight Blue bands    Reps 10-15             Perform Capillary Blood Glucose checks as needed.  Exercise Prescription Changes:   Exercise Prescription Changes     Row Name 09/30/21 1100 10/14/21 1100           Response to Exercise   Blood Pressure (Admit) 142/66 122/66      Blood Pressure (Exercise) 140/60 130/56      Blood Pressure (Exit) 124/60 120/50      Heart Rate (Admit) 90 bpm 85 bpm      Heart Rate (Exercise) 110 bpm  100 bpm      Heart Rate (Exit) 100 bpm 82 bpm      Oxygen Saturation (Admit) 98 % 100 %      Oxygen Saturation (Exercise) 94 % 97 %      Oxygen Saturation (Exit) 95 % 98 %      Rating of Perceived Exertion (Exercise) 9 13      Perceived Dyspnea (Exercise) 1 3      Duration Continue with 30 min of aerobic exercise without signs/symptoms of physical distress. Continue with 30 min of aerobic exercise without signs/symptoms of physical distress.      Intensity Other (comment)  40-80% of HRR THRR unchanged        Progression   Progression Continue to progress workloads to maintain intensity without signs/symptoms of physical distress. Continue to progress workloads to maintain intensity  without signs/symptoms of physical distress.        Resistance Training   Training Prescription Yes Yes      Weight Blue bands Blue bands      Reps 10-15 10-15      Time 12 Minutes 10 Minutes        Treadmill   MPH 2.2 1.8      Grade 0 0      Minutes 15 15        NuStep   Level 4 3      SPM 80 60      Minutes 15 15      METs 2.4 1.5               Exercise Comments:   Exercise Comments     Row Name 09/25/21 1158           Exercise Comments Pt completed 1st day of exercise. She exercised for 15 min on the treadmill and 15 min on the NuStep. She tolerated both exercise modes well. Anaisabel completed the warm up and cool down exercises standing without limitations. Pt is excited to start exercising.                Exercise Goals and Review:   Exercise Goals     Row Name 09/19/21 1357 10/13/21 0909           Exercise Goals   Increase Physical Activity Yes Yes      Intervention Provide advice, education, support and counseling about physical activity/exercise needs.;Develop an individualized exercise prescription for aerobic and resistive training based on initial evaluation findings, risk stratification, comorbidities and participant's personal goals. Provide advice, education, support and counseling about physical activity/exercise needs.;Develop an individualized exercise prescription for aerobic and resistive training based on initial evaluation findings, risk stratification, comorbidities and participant's personal goals.      Expected Outcomes Short Term: Attend rehab on a regular basis to increase amount of physical activity.;Long Term: Add in home exercise to make exercise part of routine and to increase amount of physical activity.;Long Term: Exercising regularly at least 3-5 days a week. Short Term: Attend rehab on a regular basis to increase amount of physical activity.;Long Term: Add in home exercise to make exercise part of routine and to increase amount of  physical activity.;Long Term: Exercising regularly at least 3-5 days a week.      Increase Strength and Stamina Yes Yes      Intervention Provide advice, education, support and counseling about physical activity/exercise needs.;Develop an individualized exercise prescription for aerobic and resistive training based on initial evaluation findings, risk stratification, comorbidities and  participant's personal goals. Provide advice, education, support and counseling about physical activity/exercise needs.;Develop an individualized exercise prescription for aerobic and resistive training based on initial evaluation findings, risk stratification, comorbidities and participant's personal goals.      Expected Outcomes Short Term: Increase workloads from initial exercise prescription for resistance, speed, and METs.;Short Term: Perform resistance training exercises routinely during rehab and add in resistance training at home;Long Term: Improve cardiorespiratory fitness, muscular endurance and strength as measured by increased METs and functional capacity (6MWT) Short Term: Increase workloads from initial exercise prescription for resistance, speed, and METs.;Short Term: Perform resistance training exercises routinely during rehab and add in resistance training at home;Long Term: Improve cardiorespiratory fitness, muscular endurance and strength as measured by increased METs and functional capacity (6MWT)      Able to understand and use rate of perceived exertion (RPE) scale Yes Yes      Intervention Provide education and explanation on how to use RPE scale Provide education and explanation on how to use RPE scale      Expected Outcomes Short Term: Able to use RPE daily in rehab to express subjective intensity level;Long Term:  Able to use RPE to guide intensity level when exercising independently Short Term: Able to use RPE daily in rehab to express subjective intensity level;Long Term:  Able to use RPE to guide  intensity level when exercising independently      Able to understand and use Dyspnea scale Yes Yes      Intervention Provide education and explanation on how to use Dyspnea scale Provide education and explanation on how to use Dyspnea scale      Expected Outcomes Short Term: Able to use Dyspnea scale daily in rehab to express subjective sense of shortness of breath during exertion;Long Term: Able to use Dyspnea scale to guide intensity level when exercising independently Short Term: Able to use Dyspnea scale daily in rehab to express subjective sense of shortness of breath during exertion;Long Term: Able to use Dyspnea scale to guide intensity level when exercising independently      Knowledge and understanding of Target Heart Rate Range (THRR) Yes Yes      Intervention Provide education and explanation of THRR including how the numbers were predicted and where they are located for reference Provide education and explanation of THRR including how the numbers were predicted and where they are located for reference      Expected Outcomes Short Term: Able to state/look up THRR;Short Term: Able to use daily as guideline for intensity in rehab;Long Term: Able to use THRR to govern intensity when exercising independently Short Term: Able to state/look up THRR;Short Term: Able to use daily as guideline for intensity in rehab;Long Term: Able to use THRR to govern intensity when exercising independently      Understanding of Exercise Prescription Yes Yes      Intervention Provide education, explanation, and written materials on patient's individual exercise prescription Provide education, explanation, and written materials on patient's individual exercise prescription      Expected Outcomes Short Term: Able to explain program exercise prescription;Long Term: Able to explain home exercise prescription to exercise independently Short Term: Able to explain program exercise prescription;Long Term: Able to explain home  exercise prescription to exercise independently               Exercise Goals Re-Evaluation :  Exercise Goals Re-Evaluation     Row Name 10/13/21 [id number]  Exercise Goal Re-Evaluation   Exercise Goals Review Increase Physical Activity;Increase Strength and Stamina;Able to understand and use rate of perceived exertion (RPE) scale;Able to understand and use Dyspnea scale;Knowledge and understanding of Target Heart Rate Range (THRR);Understanding of Exercise Prescription       Comments Rennee has completed 3 exericses sessions. She exercises 15 min on the treadmill and Nustep. Fianna averages 2.76 METs at 2.3 mph on the treadmill and 2.8 METs at level 4 on the Nustep. She has progressed for both exercise modes and tolerates progressions well. Jasreet is very motivated to exercise. She knows she is deconditioned and wants to improve her functional capacity. She enjoys exercising in Pulmonary Rehab as she has a positive attitude. Latavia has recently missed class due to illness. Will continue to monitor and progress as able.       Expected Outcomes Through exercise at rehab and home, the patient will decrease shortness of breath with daily activities and feel confiden in carrying out an exercise regimen at home.                Discharge Exercise Prescription (Final Exercise Prescription Changes):  Exercise Prescription Changes - 10/14/21 1100       Response to Exercise   Blood Pressure (Admit) 122/66    Blood Pressure (Exercise) 130/56    Blood Pressure (Exit) 120/50    Heart Rate (Admit) 85 bpm    Heart Rate (Exercise) 100 bpm    Heart Rate (Exit) 82 bpm    Oxygen Saturation (Admit) 100 %    Oxygen Saturation (Exercise) 97 %    Oxygen Saturation (Exit) 98 %    Rating of Perceived Exertion (Exercise) 13    Perceived Dyspnea (Exercise) 3    Duration Continue with 30 min of aerobic exercise without signs/symptoms of physical distress.    Intensity THRR unchanged       Progression   Progression Continue to progress workloads to maintain intensity without signs/symptoms of physical distress.      Resistance Training   Training Prescription Yes    Weight Blue bands    Reps 10-15    Time 10 Minutes      Treadmill   MPH 1.8    Grade 0    Minutes 15      NuStep   Level 3    SPM 60    Minutes 15    METs 1.5             Nutrition:  Target Goals: Understanding of nutrition guidelines, daily intake of sodium <1572m, cholesterol <2051m calories 30% from fat and 7% or less from saturated fats, daily to have 5 or more servings of fruits and vegetables.  Biometrics:  Pre Biometrics - 09/19/21 1129       Pre Biometrics   Grip Strength 18 kg              Nutrition Therapy Plan and Nutrition Goals:   Nutrition Assessments:  MEDIFICTS Score Key: ?70 Need to make dietary changes  40-70 Heart Healthy Diet ? 40 Therapeutic Level Cholesterol Diet   Picture Your Plate Scores: <4<84nhealthy dietary pattern with much room for improvement. 41-50 Dietary pattern unlikely to meet recommendations for good health and room for improvement. 51-60 More healthful dietary pattern, with some room for improvement.  >60 Healthy dietary pattern, although there may be some specific behaviors that could be improved.    Nutrition Goals Re-Evaluation:   Nutrition Goals Discharge (Final Nutrition Goals Re-Evaluation):  Psychosocial: Target Goals: Acknowledge presence or absence of significant depression and/or stress, maximize coping skills, provide positive support system. Participant is able to verbalize types and ability to use techniques and skills needed for reducing stress and depression.  Initial Review & Psychosocial Screening:  Initial Psych Review & Screening - 09/19/21 1111       Initial Review   Current issues with None Identified      Family Dynamics   Good Support System? Yes   Has sister, children, and friends     Barriers    Psychosocial barriers to participate in program There are no identifiable barriers or psychosocial needs.      Screening Interventions   Interventions Encouraged to exercise             Quality of Life Scores:  Scores of 19 and below usually indicate a poorer quality of life in these areas.  A difference of  2-3 points is a clinically meaningful difference.  A difference of 2-3 points in the total score of the Quality of Life Index has been associated with significant improvement in overall quality of life, self-image, physical symptoms, and general health in studies assessing change in quality of life.  PHQ-9: Recent Review Flowsheet Data     Depression screen [facility] 2/9 09/19/2021   Decreased Interest 0   Down, Depressed, Hopeless 0   PHQ - 2 Score 0   Altered sleeping 1    Tired, decreased energy 1    Change in appetite 0   Feeling bad or failure about yourself  0   Trouble concentrating 0   Moving slowly or fidgety/restless 0   Suicidal thoughts 0   PHQ-9 Score 2   Difficult doing work/chores Not difficult at all      Interpretation of Total Score  Total Score Depression Severity:  1-4 = Minimal depression, 5-9 = Mild depression, 10-14 = Moderate depression, 15-19 = Moderately severe depression, 20-27 = Severe depression   Psychosocial Evaluation and Intervention:   Psychosocial Re-Evaluation:  Psychosocial Re-Evaluation     Row Name 10/14/21 0930             Psychosocial Re-Evaluation   Current issues with None Identified       Comments For Ernestine to be able to continue participating in PR without any psychosocial barriers. She is progressing well with exercise. Annora is very motivated and is enjoying PR.       Expected Outcomes That Nahjae will continue to progress with her exercise levels and enjoy PR. That she will remain motivated to exercise and and handle her health and stress in a healthy way.       Interventions Encouraged to attend Pulmonary  Rehabilitation for the exercise;Encouraged to attend Cardiac Rehabilitation for the exercise                Psychosocial Discharge (Final Psychosocial Re-Evaluation):  Psychosocial Re-Evaluation - 10/14/21 0930       Psychosocial Re-Evaluation   Current issues with None Identified    Comments For Sallyanne to be able to continue participating in PR without any psychosocial barriers. She is progressing well with exercise. Alayzha is very motivated and is enjoying PR.    Expected Outcomes That Revia will continue to progress with her exercise levels and enjoy PR. That she will remain motivated to exercise and and handle her health and stress in a healthy way.    Interventions Encouraged to attend Pulmonary Rehabilitation for the exercise;Encouraged to attend Cardiac  Rehabilitation for the exercise             Education: Education Goals: Education classes will be provided on a weekly basis, covering required topics. Participant will state understanding/return demonstration of topics presented.  Learning Barriers/Preferences:  Learning Barriers/Preferences - 09/19/21 1114       Learning Barriers/Preferences   Learning Barriers None    Learning Preferences Audio;Computer/Internet;Group Instruction;Individual Instruction;Pictoral;Skilled Demonstration;Verbal Instruction;Video;Written Material             Education Topics: Risk Factor Reduction:  -Group instruction that is supported by a PowerPoint presentation. Instructor discusses the definition of a risk factor, different risk factors for pulmonary disease, and how the heart and lungs work together.   Flowsheet Row PULMONARY REHAB CHRONIC OBSTRUCTIVE PULMONARY DISEASE from 10/16/2021 in [place]  Date 10/16/21  Educator Remo Lipps  [Handout]       Nutrition for Pulmonary Patient:  -Group instruction provided by PowerPoint slides, verbal discussion, and written materials to support subject matter.  The instructor gives an explanation and review of healthy diet recommendations, which includes a discussion on weight management, recommendations for fruit and vegetable consumption, as well as protein, fluid, caffeine, fiber, sodium, sugar, and alcohol. Tips for eating when patients are short of breath are discussed.   Pursed Lip Breathing:  -Group instruction that is supported by demonstration and informational handouts. Instructor discusses the benefits of pursed lip and diaphragmatic breathing and detailed demonstration on how to preform both.     Oxygen Safety:  -Group instruction provided by PowerPoint, verbal discussion, and written material to support subject matter. There is an overview of "What is Oxygen" and "Why do we need it".  Instructor also reviews how to create a safe environment for oxygen use, the importance of using oxygen as prescribed, and the risks of noncompliance. There is a brief discussion on traveling with oxygen and resources the patient may utilize. Flowsheet Row PULMONARY REHAB CHRONIC OBSTRUCTIVE PULMONARY DISEASE from 10/16/2021 in [place]  Date 10/02/21  Educator Donnetta Simpers  [handout]  Instruction Review Code 1- Verbalizes Understanding       Oxygen Equipment:  -Group instruction provided by [employer] Staff utilizing handouts, written materials, and equipment demonstrations. Flowsheet Row PULMONARY REHAB CHRONIC OBSTRUCTIVE PULMONARY DISEASE from 10/16/2021 in [place]  Date 09/25/21  Educator Donnetta Simpers  [Handout]  Instruction Review Code 1- Verbalizes Understanding       Signs and Symptoms:  -Group instruction provided by written material and verbal discussion to support subject matter. Warning signs and symptoms of infection, stroke, and heart attack are reviewed and when to call the physician/911 reinforced. Tips for preventing the spread of infection discussed.   Advanced Directives:   -Group instruction provided by verbal instruction and written material to support subject matter. Instructor reviews Advanced Directive laws and proper instruction for filling out document.   Pulmonary Video:  -Group video education that reviews the importance of medication and oxygen compliance, exercise, good nutrition, pulmonary hygiene, and pursed lip and diaphragmatic breathing for the pulmonary patient.   Exercise for the Pulmonary Patient:  -Group instruction that is supported by a PowerPoint presentation. Instructor discusses benefits of exercise, core components of exercise, frequency, duration, and intensity of an exercise routine, importance of utilizing pulse oximetry during exercise, safety while exercising, and options of places to exercise outside of rehab.     Pulmonary Medications:  -Verbally interactive group education provided by instructor with focus on  inhaled medications and proper administration.   Anatomy and Physiology of the Respiratory System and Intimacy:  -Group instruction provided by PowerPoint, verbal discussion, and written material to support subject matter. Instructor reviews respiratory cycle and anatomical components of the respiratory system and their functions. Instructor also reviews differences in obstructive and restrictive respiratory diseases with examples of each. Intimacy, Sex, and Sexuality differences are reviewed with a discussion on how relationships can change when diagnosed with pulmonary disease. Common sexual concerns are reviewed.   MD DAY -A group question and answer session with a medical doctor that allows participants to ask questions that relate to their pulmonary disease state.   OTHER EDUCATION -Group or individual verbal, written, or video instructions that support the educational goals of the pulmonary rehab program.   Holiday Eating Survival Tips:  -Group instruction provided by PowerPoint slides, verbal discussion, and  written materials to support subject matter. The instructor gives patients tips, tricks, and techniques to help them not only survive but enjoy the holidays despite the onslaught of food that accompanies the holidays.   Knowledge Questionnaire Score:  Knowledge Questionnaire Score - 09/19/21 1132       Knowledge Questionnaire Score   Pre Score 14/18             Core Components/Risk Factors/Patient Goals at Admission:  Personal Goals and Risk Factors at Admission - 09/19/21 1116       Core Components/Risk Factors/Patient Goals on Admission    Weight Management Weight Loss    Improve shortness of breath with ADL's Yes    Intervention Provide education, individualized exercise plan and daily activity instruction to help decrease symptoms of SOB with activities of daily living.    Expected Outcomes Short Term: Improve cardiorespiratory fitness to achieve a reduction of symptoms when performing ADLs;Long Term: Be able to perform more ADLs without symptoms or delay the onset of symptoms             Core Components/Risk Factors/Patient Goals Review:   Goals and Risk Factor Review     Row Name 10/14/21 0948 10/14/21 0950           Core Components/Risk Factors/Patient Goals Review   Personal Goals Review Weight Management/Obesity;Improve shortness of breath with ADL's;Develop more efficient breathing techniques such as purse lipped breathing and diaphragmatic breathing and practicing self-pacing with activity.;Increase knowledge of respiratory medications and ability to use respiratory devices properly. Weight Management/Obesity;Improve shortness of breath with ADL's;Develop more efficient breathing techniques such as purse lipped breathing and diaphragmatic breathing and practicing self-pacing with activity.;Increase knowledge of respiratory medications and ability to use respiratory devices properly.;Diabetes;Hypertension;Lipids;Stress      Review -- Simona has attended 3 exercise  sessions and had a slight drop in her weight. She voices that she wants to continue to lose weight and get active with exercising. Her blood sugars have been doing well along with her BP. Ciela is very mindful of her diet and states that she following her diabetic diet.Arvella's husband was a former PR patient and this has motivated her to want to participate in the program. Even though she has lost her husband, she has used his experience here to continue to motivate her and handle the stress of losong him in a postive way.She has moved to a condo and she is considering what  she will do when she needs more assistances in the future with her living situation and her health.      Expected Outcomes -- see initial core components  Core Components/Risk Factors/Patient Goals at Discharge (Final Review):   Goals and Risk Factor Review - 10/14/21 0950       Core Components/Risk Factors/Patient Goals Review   Personal Goals Review Weight Management/Obesity;Improve shortness of breath with ADL's;Develop more efficient breathing techniques such as purse lipped breathing and diaphragmatic breathing and practicing self-pacing with activity.;Increase knowledge of respiratory medications and ability to use respiratory devices properly.;Diabetes;Hypertension;Lipids;Stress    Review Everline has attended 3 exercise sessions and had a slight drop in her weight. She voices that she wants to continue to lose weight and get active with exercising. Her blood sugars have been doing well along with her BP. Florina is very mindful of her diet and states that she following her diabetic diet.Taletha's husband was a former PR patient and this has motivated her to want to participate in the program. Even though she has lost her husband, she has used his experience here to continue to motivate her and handle the stress of losong him in a postive way.She has moved to a condo and she is considering what  she will do when she  needs more assistances in the future with her living situation and her health.    Expected Outcomes see initial core components             ITP Comments:   Comments: ITP REVIEW Pt is making expected progress toward pulmonary rehab goals after completing 5 sessions. Recommend continued exercise, life style modification, education, and utilization of breathing techniques to increase stamina and strength and decrease shortness of breath with exertion. Dr. Rodman Pickle is Medical Director for Pulmonary Rehab at [facility].

## 2021-10-21 NOTE — Progress Notes (Signed)
Kelly Mooney    [id number]    02/12/1946  Primary Care Physician:Ross, Dwyane Luo, MD Date of Appointment: 10/21/2021 Established Patient Visit  Chief complaint:   Chief Complaint  Patient presents with   Follow-up    Pneumonia in oct., SOB has been the same     HPI: Kelly Mooney is a 75 y.o. woman with COPD Gold Stage II, quit smoking 25 years ago.   Interval Updates: Here for follow up. Has started pulmonary rehab. Had pneumonia and was treated at [facility] with abx, not admitted.   Still on trelegy 200 which helps a lot. She loves pulmonary rehab and it has helped a lot.   She has sinus congestion with drainage back into her throat and chest. Mild cough. On montelukast.   Denies reflux or heart burn.   I have reviewed the patient's family social and past medical history and updated as appropriate.   Past Medical History:  Diagnosis Date   Agatston coronary artery calcium score greater than 400    462 on coronary calcium score 09/2021   Arthritis    Asthma    since age 60   Cataract    Colon polyps    Complication of anesthesia    slow to wake up    COPD (chronic obstructive pulmonary disease) ([place])    COVID-19 09/2019   Diabetes mellitus    Diverticulosis    Dyspnea    with exertoin due to asthma    Esophageal stricture    Family history of adverse reaction to anesthesia    mother , daughter and son slow to wake up and  problems with N/V    GERD (gastroesophageal reflux disease)    Hemorrhoids    Hiatal hernia    History of kidney stones    Hypercholesteremia    Hypertension    Hypothyroidism    IBS (irritable bowel syndrome)    Paraesophageal hernia    PONV (postoperative nausea and vomiting)    Stroke ([place])    no deficits    Tubular adenoma of colon     Past Surgical History:  Procedure Laterality Date   ABDOMINAL HYSTERECTOMY     BUNIONECTOMY Right 10/2012   CATARACT EXTRACTION, BILATERAL     CHOLECYSTECTOMY   1980   ESOPHAGEAL MANOMETRY N/A 07/15/2015   Procedure: ESOPHAGEAL MANOMETRY (EM);  Surgeon: Irene Shipper, MD;  Location: [facility];  Service: Endoscopy;  Laterality: N/A;   HEMORRHOID SURGERY N/A 08/01/2020   Procedure: HEMORRHOIDECTOMY, HEMORRHOIDAL LIGATION /PEXY ANORECTAL EXAMINATION UNDER ANESTHESIA;  Surgeon: Michael Boston, MD;  Location: [facility];  Service: General;  Laterality: N/A;  LOCAL   PARTIAL HYSTERECTOMY     RHINOPLASTY     nasal polyps removed dr. Barrie Folk    Family History  Problem Relation Age of Onset   Diabetes Mellitus I Mother    Hyperlipidemia Mother    Asthma Mother    Heart attack Father    Heart attack Brother    Asthma Brother    Heart attack Brother    Colon cancer Neg Hx    Colon polyps Neg Hx    Esophageal cancer Neg Hx    Rectal cancer Neg Hx    Stomach cancer Neg Hx     Social History   Occupational History   Occupation: Retired  Tobacco Use   Smoking status: Former    Packs/day: 1.50    Years: 10.00  Pack years: 15.00    Types: Cigarettes    Quit date: 10/02/1996    Years since quitting: 25.0   Smokeless tobacco: Never   Tobacco comments:    quit 1997  Vaping Use   Vaping Use: Never used  Substance and Sexual Activity   Alcohol use: No    Alcohol/week: 0.0 standard drinks   Drug use: No   Sexual activity: Not on file     Physical Exam: Blood pressure 140/76, pulse 78, height 5\' 2"  (1.575 m), weight 159 lb 12.8 oz (72.5 kg), SpO2 96 %.  Gen:      No acute distress ENT: mild cobblestoning in oropharynx Lungs:    Diminished, No increased respiratory effort, symmetric chest wall excursion, clear to auscultation bilaterally, no wheezes or crackles CV:         Regular rate and rhythm; no murmurs, rubs, or gallops.  No pedal edema   Data Reviewed: Imaging: I have personally reviewed the chest xray Nov 2020 with hyperinflation  PFTs:   PFT Results Latest Ref Rng & Units 04/21/2021  FVC-Pre L 2.03  FVC-Predicted Pre % 78   FVC-Post L 2.18  FVC-Predicted Post % 84  Pre FEV1/FVC % % 59  Post FEV1/FCV % % 62  FEV1-Pre L 1.19  FEV1-Predicted Pre % 62  FEV1-Post L 1.35  DLCO uncorrected ml/min/mmHg 18.50  DLCO UNC% % 103  DLCO corrected ml/min/mmHg 18.50  DLCO COR %Predicted % 103  DLVA Predicted % 111  TLC L 5.64  TLC % Predicted % 118  RV % Predicted % 154   I have personally reviewed the patient's PFTs and moderately severe COPD without BD response. Lung volumes with hyperinflation and air trapping  Labs: Lab Results  Component Value Date   WBC 10.6 (H) 08/30/2021   HGB 12.8 08/30/2021   HCT 38.1 08/30/2021   MCV 86.2 08/30/2021   PLT 250 08/30/2021    Immunization status: Immunization History  Administered Date(s) Administered   Influenza Split 09/17/2009, 08/14/2010, 07/26/2012, 08/03/2014, 08/24/2020   Influenza, High Dose Seasonal PF 09/21/2016, 09/06/2018, 08/24/2019, 10/20/2021   Influenza,inj,quad, With Preservative 07/31/2015   Influenza-Unspecified 07/28/2013, 08/16/2014, 08/19/2015   PFIZER Comirnaty(Gray Top)Covid-19 Tri-Sucrose Vaccine 12/21/2019, 01/12/2020, 01/02/2021   PFIZER(Purple Top)SARS-COV-2 Vaccination 12/22/2019, 01/12/2020   Pneumococcal Conjugate-13 01/29/2014   Pneumococcal Polysaccharide-23 08/14/2010, 05/27/2021   Tdap 08/14/2010, 05/13/2020   Zoster, Live 01/26/2013, 05/27/2021    Assessment:  COPD, Gold stage 2 FEV1 59% of predicted Chronic Rhinitis, worsening  Plan/Recommendations: continue trelegy, prn albuterol Continue pulmonary rehab Add flonase with singulair for rhinitis.    Return to Care: Return in about 6 months (around 04/21/2022).   Lenice Llamas, MD Pulmonary and [place]

## 2021-10-21 NOTE — Progress Notes (Signed)
Daily Session Note  Patient Details  Name: Kelly Mooney MRN: [id number] Date of Birth: 1946-06-21 Referring Provider:   [name] Pulmonary Rehab Walk Test from 09/19/2021 in [place]  Referring Provider [name]       Encounter Date: 10/21/2021  Check In:  Session Check In - 10/21/21 1146       Check-In   Supervising physician immediately available to respond to emergencies [facility] immediately available    Physician(s) Dr. Cathlean Sauer    Location [facility]    Staff Present Rosebud Poles, RN, BSN;Lisa Ysidro Evert, Cathleen Fears, MS, ACSM-CEP, Exercise Physiologist;Portia Rollene Rotunda, RN, BSN    Virtual Visit No    Medication changes reported     No    Fall or balance concerns reported    No    Tobacco Cessation No Change    Warm-up and Cool-down Performed as group-led instruction    Resistance Training Performed Yes    VAD Patient? No    PAD/SET Patient? No      Pain Assessment   Currently in Pain? No/denies    Multiple Pain Sites No             Capillary Blood Glucose: Results for orders placed or performed during the hospital encounter of 10/21/21 (from the past 24 hour(s))  Glucose, capillary     Status: Abnormal   Collection Time: 10/21/21 11:15 AM  Result Value Ref Range   Glucose-Capillary 131 (H) 70 - 99 mg/dL      Social History   Tobacco Use  Smoking Status Former   Packs/day: 1.50   Years: 10.00   Pack years: 15.00   Types: Cigarettes   Quit date: 10/02/1996   Years since quitting: 25.0  Smokeless Tobacco Never  Tobacco Comments   quit 1997    Goals Met:  Proper associated with RPD/PD & O2 Sat Independence with exercise equipment Improved SOB with ADL's Using PLB without cueing & demonstrates good technique Exercise tolerated well No report of concerns or symptoms today Strength training completed today  Goals Unmet:  Not Applicable  Comments: Service time is from 1015 to  [place]    Dr. Rodman Pickle is Medical Director for Pulmonary Rehab at [facility].

## 2021-10-21 NOTE — Patient Instructions (Signed)
Please schedule follow up scheduled with myself in 6 months.  If my schedule is not open yet, we will contact you with a reminder closer to that time. Please call [phone] if you haven't heard from [place] a month before.     Flonase - 1 spray on each side of your nose twice a day for first week, then 1 spray on each side.   Instructions for use: If you also use a saline nasal spray or rinse, use that first. Position the head with the chin slightly tucked. Use the right hand to spray into the left nostril and the right hand to spray into the left nostril.   Point the bottle away from the septum of your nose (cartilage that divides the two sides of your nose).  Hold the nostril closed on the opposite side from where you will spray Spray once and gently sniff to pull the medicine into the higher parts of your nose.  Don't sniff too hard as the medicine will drain down the back of your throat instead. Repeat with a second spray on the same side if prescribed. Repeat on the other side of your nose.

## 2021-10-22 LAB — LIPID PANEL
Chol/HDL Ratio: 2.5 ratio (ref 0.0–4.4)
Cholesterol, Total: 131 mg/dL (ref 100–199)
HDL: 52 mg/dL (ref >39)
LDL Chol Calc (NIH): 57 mg/dL (ref 0–99)
Triglycerides: 127 mg/dL (ref 0–149)
VLDL Cholesterol Cal: 22 mg/dL (ref 5–40)

## 2021-10-22 LAB — ALT: ALT: 13 IU/L (ref 0–32)

## 2021-10-23 ENCOUNTER — Encounter: Payer: Self-pay | Admitting: Cardiology

## 2021-10-23 ENCOUNTER — Other Ambulatory Visit: Payer: Self-pay

## 2021-10-23 ENCOUNTER — Encounter (HOSPITAL_COMMUNITY)
Admission: RE | Admit: 2021-10-23 | Discharge: 2021-10-23 | Disposition: A | Discharge status: Home / Self Care | Payer: Medicare Other | Source: Ambulatory Visit | Attending: Internal Medicine | Admitting: Internal Medicine

## 2021-10-23 DIAGNOSIS — J449 Chronic obstructive pulmonary disease, unspecified: Secondary | ICD-10-CM | POA: Diagnosis not present

## 2021-10-23 NOTE — Progress Notes (Signed)
Daily Session Note  Patient Details  Name: Kelly Mooney MRN: [id number] Date of Birth: September 03, 1946 Referring Provider:   [name] Pulmonary Rehab Walk Test from 09/19/2021 in [place]  Referring Provider [name]       Encounter Date: 10/23/2021  Check In:  Session Check In - 10/23/21 1129       Check-In   Supervising physician immediately available to respond to emergencies [facility] immediately available    Physician(s) Dr. Broadus John    Location [facility]    Staff Present Rodney Langton, Cathleen Fears, MS, ACSM-CEP, Exercise Physiologist    Virtual Visit No    Medication changes reported     No    Fall or balance concerns reported    No    Tobacco Cessation No Change    Warm-up and Cool-down Performed as group-led instruction    Resistance Training Performed Yes    VAD Patient? No    PAD/SET Patient? No      Pain Assessment   Currently in Pain? No/denies    Multiple Pain Sites No             Capillary Blood Glucose: No results found for this or any previous visit (from the past 24 hour(s)).    Social History   Tobacco Use  Smoking Status Former   Packs/day: 1.50   Years: 10.00   Pack years: 15.00   Types: Cigarettes   Quit date: 10/02/1996   Years since quitting: 25.0  Smokeless Tobacco Never  Tobacco Comments   quit 1997    Goals Met:  Exercise tolerated well No report of concerns or symptoms today Strength training completed today  Goals Unmet:  Not Applicable  Comments: Service time is from 1020 to 1140    Dr. Rodman Pickle is Medical Director for Pulmonary Rehab at [facility].

## 2021-10-28 ENCOUNTER — Other Ambulatory Visit: Payer: Self-pay

## 2021-10-28 ENCOUNTER — Encounter: Payer: Self-pay | Admitting: Cardiology

## 2021-10-28 ENCOUNTER — Encounter (HOSPITAL_COMMUNITY)
Admission: RE | Admit: 2021-10-28 | Discharge: 2021-10-28 | Disposition: A | Discharge status: Home / Self Care | Payer: Medicare Other | Source: Ambulatory Visit | Attending: Internal Medicine | Admitting: Internal Medicine

## 2021-10-28 VITALS — Wt 161.4 lb

## 2021-10-28 DIAGNOSIS — J449 Chronic obstructive pulmonary disease, unspecified: Secondary | ICD-10-CM | POA: Diagnosis not present

## 2021-10-28 NOTE — Progress Notes (Signed)
Daily Session Note  Patient Details  Name: Kelly Mooney MRN: [id number] Date of Birth: 05-10-1946 Referring Provider:   [name] Pulmonary Rehab Walk Test from 09/19/2021 in [place]  Referring Provider [name]       Encounter Date: 10/28/2021  Check In:  Session Check In - 10/28/21 1130       Check-In   Supervising physician immediately available to respond to emergencies [facility] immediately available    Physician(s) Dr. Broadus John    Location [facility]    Staff Present Rodney Langton, Cathleen Fears, MS, ACSM-CEP, Exercise Physiologist;Olinty Celesta Aver, MS, ACSM CEP, Exercise Physiologist    Virtual Visit No    Medication changes reported     No    Fall or balance concerns reported    No    Tobacco Cessation No Change    Warm-up and Cool-down Performed as group-led instruction    Resistance Training Performed Yes    VAD Patient? No    PAD/SET Patient? No      Pain Assessment   Currently in Pain? No/denies    Multiple Pain Sites No             Capillary Blood Glucose: No results found for this or any previous visit (from the past 24 hour(s)).  POCT Glucose - 10/28/21 1225       POCT Blood Glucose   Pre-Exercise 151 mg/dL    Post-Exercise 75 mg/dL    Pre-Exercise #2 106 mg/dL             Exercise Prescription Changes - 10/28/21 1200       Response to Exercise   Blood Pressure (Admit) 130/60    Blood Pressure (Exercise) 164/60    Blood Pressure (Exit) 120/50    Heart Rate (Admit) 71 bpm    Heart Rate (Exercise) 102 bpm    Heart Rate (Exit) 85 bpm    Oxygen Saturation (Admit) 100 %    Oxygen Saturation (Exercise) 95 %    Oxygen Saturation (Exit) 97 %    Rating of Perceived Exertion (Exercise) 9    Perceived Dyspnea (Exercise) 1    Duration Continue with 30 min of aerobic exercise without signs/symptoms of physical distress.    Intensity THRR unchanged      Progression   Progression  Continue to progress workloads to maintain intensity without signs/symptoms of physical distress.      Resistance Training   Training Prescription Yes    Weight Blue bands    Reps 10-15    Time 10 Minutes      Treadmill   MPH 2.6    Grade 0    Minutes 15      NuStep   Level 5    SPM 75    Minutes 15    METs 2.8             Social History   Tobacco Use  Smoking Status Former   Packs/day: 1.50   Years: 10.00   Pack years: 15.00   Types: Cigarettes   Quit date: 10/02/1996   Years since quitting: 25.0  Smokeless Tobacco Never  Tobacco Comments   quit 1997    Goals Met:  Exercise tolerated well No report of concerns or symptoms today Strength training completed today  Goals Unmet:  Not Applicable  Comments: Service time is from 1018 to 1145    Dr. Rodman Pickle is Medical Director for Pulmonary Rehab at [facility].

## 2021-10-30 ENCOUNTER — Other Ambulatory Visit: Payer: Self-pay

## 2021-10-30 ENCOUNTER — Encounter (HOSPITAL_COMMUNITY)
Admission: RE | Admit: 2021-10-30 | Discharge: 2021-10-30 | Disposition: A | Discharge status: Home / Self Care | Payer: Medicare Other | Source: Ambulatory Visit | Attending: Internal Medicine | Admitting: Internal Medicine

## 2021-10-30 DIAGNOSIS — J449 Chronic obstructive pulmonary disease, unspecified: Secondary | ICD-10-CM

## 2021-10-30 NOTE — Progress Notes (Signed)
Home Exercise Prescription I have reviewed a Home Exercise Prescription with Kelly Mooney. Kelly Mooney is currently exercising at home. Kelly Mooney walks 3 non-rehab days/wk for 5-10 min/day. The patient was advised to walk 1-2 non-rehab days/wk for 2x15 or 30 min/day. Kelly Mooney and I discussed how to progress their exercise prescription. I encouraged Kelly Mooney to increase time and decrease frequency. I discussed the value of increasing time to meet physical activity guidelines. She stated that she would try to increase time. I told her to take as many rest breaks as needed or divide the 30 min in the day. I also mentioned how it was cold outside and recommended going to a fitness center. She said that she had access to fitness center and would try going there. Kelly Mooney stated that they understand the exercise prescription. The patient stated that their goals were to to set up a membership at [employer] center. She does have Silver Sneakers and is planning to coordinate with the fitness center. We reviewed exercise guidelines, target heart rate during exercise, RPE Scale, weather conditions, endpoints for exercise, warmup and cool down. The patient is encouraged to come to me with any questions. I will continue to follow up with the patient to assist them with progression and safety.    Sheppard Plumber, MS, ACSM-CEP 10/30/2021 3:03 PM

## 2021-10-30 NOTE — Progress Notes (Signed)
Daily Session Note ° °Patient Details  °Name: Nashalie S Fox °MRN: [id number] °Date of Birth: 08/30/1946 °Referring Provider:   °Flowsheet Row Pulmonary Rehab Walk Test from 09/19/2021 in [facility]  °Referring Provider Desai  ° °  ° ° °Encounter Date: 10/30/2021 ° °Check In: ° Session Check In - 10/30/21 1205   ° °  ° Check-In  ° Supervising physician immediately available to respond to emergencies [facility] immediately available   ° Physician(s) Dr. Ramesh   ° Location [facility]   ° Staff Present David Makemson, MS, ACSM-CEP, CCRP, Exercise Physiologist;Kaylee Davis, MS, ACSM-CEP, Exercise Physiologist;Lisa Hughes, RN;Portia Payne, RN, BSN   ° Virtual Visit No   ° Medication changes reported     No   ° Fall or balance concerns reported    No   ° Tobacco Cessation No Change   ° Warm-up and Cool-down Performed as group-led instruction   ° Resistance Training Performed Yes   ° VAD Patient? No   ° PAD/SET Patient? No   °  ° Pain Assessment  ° Currently in Pain? No/denies   ° Multiple Pain Sites No   ° °  °  ° °  ° ° °Capillary Blood Glucose: °No results found for this or any previous visit (from the past 24 hour(s)). ° ° ° °Social History  ° °Tobacco Use  °Smoking Status Former  ° Packs/day: 1.50  ° Years: 10.00  ° Pack years: 15.00  ° Types: Cigarettes  ° Quit date: 10/02/1996  ° Years since quitting: 25.0  °Smokeless Tobacco Never  °Tobacco Comments  ° quit 1997  ° ° °Goals Met:  °Independence with exercise equipment °Exercise tolerated well °No report of concerns or symptoms today °Strength training completed today ° °Goals Unmet:  °Not Applicable ° °Comments: Service time is from 1015 to 1132.  ° °Dr. Jane Ellison is Medical Director for Pulmonary Rehab at [facility].  °

## 2021-11-04 ENCOUNTER — Telehealth (HOSPITAL_COMMUNITY): Payer: Self-pay | Admitting: *Deleted

## 2021-11-04 ENCOUNTER — Encounter (HOSPITAL_COMMUNITY): Payer: Medicare Other

## 2021-11-04 ENCOUNTER — Telehealth (HOSPITAL_COMMUNITY): Payer: Self-pay | Admitting: Family Medicine

## 2021-11-04 NOTE — Telephone Encounter (Addendum)
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 I contacted her to see if she would be able to attend our 1:15 pm class. She states that would work for her. She has had some absences due to illness and she is out today due to her brother passing. I offered her to extend her graduation date 2 weeks if she could attend the 1:15 class and she agreered to that.

## 2021-11-06 ENCOUNTER — Encounter (HOSPITAL_COMMUNITY)
Admission: RE | Admit: 2021-11-06 | Discharge: 2021-11-06 | Disposition: A | Discharge status: Home / Self Care | Payer: Medicare Other | Source: Ambulatory Visit | Attending: Internal Medicine | Admitting: Internal Medicine

## 2021-11-06 ENCOUNTER — Encounter (HOSPITAL_COMMUNITY): Payer: Medicare Other

## 2021-11-06 ENCOUNTER — Other Ambulatory Visit: Payer: Self-pay

## 2021-11-06 DIAGNOSIS — J449 Chronic obstructive pulmonary disease, unspecified: Secondary | ICD-10-CM | POA: Diagnosis not present

## 2021-11-06 NOTE — Progress Notes (Signed)
Daily Session Note  Patient Details  Name: Kelly Mooney MRN: [id number] Date of Birth: 06-19-1946 Referring Provider:   [name] Pulmonary Rehab Walk Test from 09/19/2021 in [place]  Referring Provider [name]       Encounter Date: 11/06/2021  Check In:  Session Check In - 11/06/21 1203       Check-In   Supervising physician immediately available to respond to emergencies [facility] immediately available    Physician(s) Dr. Maryland Pink    Location [facility]    Staff Present Rodney Langton, Cathleen Fears, MS, ACSM-CEP, Exercise Physiologist;Joan Leonia Reeves, RN, BSN;Carlette Carlton, RN, BSN    Virtual Visit No    Medication changes reported     No    Fall or balance concerns reported    No    Tobacco Cessation No Change    Warm-up and Cool-down Performed as group-led Higher education careers adviser Performed Yes    VAD Patient? No    PAD/SET Patient? No      Pain Assessment   Currently in Pain? No/denies    Pain Score 0-No pain    Multiple Pain Sites No             Capillary Blood Glucose: No results found for this or any previous visit (from the past 24 hour(s)).    Social History   Tobacco Use  Smoking Status Former   Packs/day: 1.50   Years: 10.00   Pack years: 15.00   Types: Cigarettes   Quit date: 10/02/1996   Years since quitting: 25.1  Smokeless Tobacco Never  Tobacco Comments   quit 1997    Goals Met:  Exercise tolerated well No report of concerns or symptoms today Strength training completed today  Goals Unmet:  Not Applicable  Comments: Service time is from 1020 to 1140    Dr. Rodman Pickle is Medical Director for Pulmonary Rehab at [facility].

## 2021-11-11 ENCOUNTER — Encounter (HOSPITAL_COMMUNITY)
Admission: RE | Admit: 2021-11-11 | Discharge: 2021-11-11 | Disposition: A | Discharge status: Home / Self Care | Payer: Medicare Other | Source: Ambulatory Visit | Attending: Internal Medicine | Admitting: Internal Medicine

## 2021-11-11 ENCOUNTER — Other Ambulatory Visit: Payer: Self-pay

## 2021-11-11 VITALS — Wt 162.9 lb

## 2021-11-11 DIAGNOSIS — J449 Chronic obstructive pulmonary disease, unspecified: Secondary | ICD-10-CM | POA: Diagnosis not present

## 2021-11-11 LAB — GLUCOSE, CAPILLARY: Glucose-Capillary: 78 mg/dL (ref 70–99)

## 2021-11-11 NOTE — Progress Notes (Signed)
Daily Session Note  Patient Details  Name: Kelly Mooney MRN: [id number] Date of Birth: 03-Mar-1946 Referring Provider:   [name] Pulmonary Rehab Walk Test from 09/19/2021 in [place]  Referring Provider [name]       Encounter Date: 11/11/2021  Check In:  Session Check In - 11/11/21 1429       Check-In   Supervising physician immediately available to respond to emergencies [facility] immediately available    Physician(s) Dr. Maryland Pink    [facility]    Staff Present Rodney Langton, Cathleen Fears, MS, ACSM-CEP, Exercise Physiologist    Virtual Visit No    Medication changes reported     No    Fall or balance concerns reported    No    Tobacco Cessation No Change    Warm-up and Cool-down Performed as group-led instruction    Resistance Training Performed Yes    VAD Patient? No    PAD/SET Patient? No      Pain Assessment   Currently in Pain? No/denies    Multiple Pain Sites No             Capillary Blood Glucose: Results for orders placed or performed during the hospital encounter of 11/11/21 (from the past 24 hour(s))  Glucose, capillary     Status: None   Collection Time: 11/11/21  2:48 PM  Result Value Ref Range   Glucose-Capillary 78 70 - 99 mg/dL    POCT Glucose - 11/11/21 1520       POCT Blood Glucose   Pre-Exercise 141 mg/dL    Post-Exercise 78 mg/dL   Gave ginger ale to drink            Exercise Prescription Changes - 11/11/21 1500       Response to Exercise   Blood Pressure (Admit) 122/60    Blood Pressure (Exercise) 150/62    Blood Pressure (Exit) 134/50    Heart Rate (Admit) 81 bpm    Heart Rate (Exercise) 105 bpm    Heart Rate (Exit) 85 bpm    Oxygen Saturation (Admit) 99 %    Oxygen Saturation (Exercise) 96 %    Oxygen Saturation (Exit) 96 %    Rating of Perceived Exertion (Exercise) 13    Perceived Dyspnea (Exercise) 2    Duration Continue with 30 min of aerobic  exercise without signs/symptoms of physical distress.    Intensity THRR unchanged      Progression   Progression Continue to progress workloads to maintain intensity without signs/symptoms of physical distress.      Resistance Training   Training Prescription Yes    Weight Bliue bands    Reps 10-15    Time 10 Minutes      Treadmill   MPH 2.9    Grade 0    Minutes 15      NuStep   Level 4    SPM 80    Minutes 15    METs 2.5             Social History   Tobacco Use  Smoking Status Former   Packs/day: 1.50   Years: 10.00   Pack years: 15.00   Types: Cigarettes   Quit date: 10/02/1996   Years since quitting: 25.1  Smokeless Tobacco Never  Tobacco Comments   quit 1997    Goals Met:  Exercise tolerated well No report of concerns or symptoms today Strength training completed today  Goals  Unmet:  Not Applicable  Comments: Service time is from 1321 to 1440 After exercise pt's blood sugar 78. Pt given ginger ale to drink. She was not symptomatic with blood sugar.  Dr. Rodman Pickle is Medical Director for Pulmonary Rehab at [facility].

## 2021-11-13 ENCOUNTER — Encounter (HOSPITAL_COMMUNITY)
Admission: RE | Admit: 2021-11-13 | Discharge: 2021-11-13 | Disposition: A | Discharge status: Home / Self Care | Payer: Medicare Other | Source: Ambulatory Visit | Attending: Internal Medicine | Admitting: Internal Medicine

## 2021-11-13 ENCOUNTER — Other Ambulatory Visit: Payer: Self-pay

## 2021-11-13 DIAGNOSIS — J449 Chronic obstructive pulmonary disease, unspecified: Secondary | ICD-10-CM

## 2021-11-13 NOTE — Progress Notes (Signed)
Daily Session Note  Patient Details  Name: Kelly Mooney MRN: [id number] Date of Birth: 24-Apr-1946 Referring Provider:   [name] Pulmonary Rehab Walk Test from 09/19/2021 in [place]  Referring Provider [name]       Encounter Date: 11/13/2021  Check In:  Session Check In - 11/13/21 1433       Check-In   Supervising physician immediately available to respond to emergencies [facility] immediately available    Physician(s) Dr, Sloan Leiter    Staff Present Rodney Langton, Cathleen Fears, MS, ACSM-CEP, Exercise Physiologist;David Lilyan Punt, MS, ACSM-CEP, CCRP, Exercise Physiologist;Jetta Walker BS, ACSM EP-C, Exercise Physiologist    Virtual Visit No    Medication changes reported     No    Fall or balance concerns reported    No    Tobacco Cessation No Change    Warm-up and Cool-down Performed as group-led instruction    Resistance Training Performed Yes    VAD Patient? No    PAD/SET Patient? No      Pain Assessment   Currently in Pain? No/denies    Multiple Pain Sites No             Capillary Blood Glucose: No results found for this or any previous visit (from the past 24 hour(s)).    Social History   Tobacco Use  Smoking Status Former   Packs/day: 1.50   Years: 10.00   Pack years: 15.00   Types: Cigarettes   Quit date: 10/02/1996   Years since quitting: 25.1  Smokeless Tobacco Never  Tobacco Comments   quit 1997    Goals Met:  Exercise tolerated well No report of concerns or symptoms today Strength training completed today  Goals Unmet:  Not Applicable  Comments: Service time is from 1315 to 1445 At the end of exercise today Kelly Mooney's blood sugar was 78 she was given ginger ale to drink.   Dr. Rodman Pickle is Medical Director for Pulmonary Rehab at [facility].

## 2021-11-18 ENCOUNTER — Encounter (HOSPITAL_COMMUNITY)
Admission: RE | Admit: 2021-11-18 | Discharge: 2021-11-18 | Disposition: A | Discharge status: Home / Self Care | Payer: Medicare Other | Source: Ambulatory Visit | Attending: Internal Medicine | Admitting: Internal Medicine

## 2021-11-18 ENCOUNTER — Other Ambulatory Visit: Payer: Self-pay

## 2021-11-18 DIAGNOSIS — J439 Emphysema, unspecified: Secondary | ICD-10-CM | POA: Diagnosis not present

## 2021-11-18 DIAGNOSIS — E119 Type 2 diabetes mellitus without complications: Secondary | ICD-10-CM | POA: Insufficient documentation

## 2021-11-18 DIAGNOSIS — J449 Chronic obstructive pulmonary disease, unspecified: Secondary | ICD-10-CM

## 2021-11-18 NOTE — Progress Notes (Signed)
Daily Session Note  Patient Details  Name: Kelly Mooney MRN: [id number] Date of Birth: 1946/04/01 Referring Provider:   [name] Pulmonary Rehab Walk Test from 09/19/2021 in [place]  Referring Provider [name]       Encounter Date: 11/18/2021  Check In:  Session Check In - 11/18/21 1416       Check-In   Supervising physician immediately available to respond to emergencies [facility] immediately available    Physician(s) Dr. Arbutus Ped    Location [facility]    Staff Present Rosebud Poles, RN, Quentin Ore, MS, ACSM-CEP, Exercise Physiologist;Lisa Ysidro Evert, RN    Virtual Visit No    Medication changes reported     No    Fall or balance concerns reported    No    Tobacco Cessation No Change    Warm-up and Cool-down Performed as group-led instruction    Resistance Training Performed Yes    VAD Patient? No    PAD/SET Patient? No      Pain Assessment   Currently in Pain? No/denies    Multiple Pain Sites No             Capillary Blood Glucose: No results found for this or any previous visit (from the past 24 hour(s)).    Social History   Tobacco Use  Smoking Status Former   Packs/day: 1.50   Years: 10.00   Pack years: 15.00   Types: Cigarettes   Quit date: 10/02/1996   Years since quitting: 25.1  Smokeless Tobacco Never  Tobacco Comments   quit 1997    Goals Met:  Proper associated with RPD/PD & O2 Sat Independence with exercise equipment Exercise tolerated well No report of concerns or symptoms today Strength training completed today  Goals Unmet:  Not Applicable  Comments: Service time is from 1320 to 1430.    Dr. Rodman Pickle is Medical Director for Pulmonary Rehab at [facility].

## 2021-11-19 NOTE — Progress Notes (Signed)
Pulmonary Individual Treatment Plan  Patient Details  Name: Kelly Mooney MRN: [id number] Date of Birth: 1946/10/21 Referring Provider:   [name] Pulmonary Rehab Walk Test from 09/19/2021 in [place]  Referring Provider [name]       Initial Encounter Date:  Flowsheet Row Pulmonary Rehab Walk Test from 09/19/2021 in [place]  Date 09/19/21       Visit Diagnosis: COPD with chronic bronchitis and emphysema ([place])  Patient's Home Medications on Admission:   Current Outpatient Medications:    albuterol (PROVENTIL HFA;VENTOLIN HFA) 108 (90 BASE) MCG/ACT inhaler, Inhale 2 puffs into the lungs 4 (four) times daily. , Disp: , Rfl:    albuterol (PROVENTIL) (2.5 MG/3ML) 0.083% nebulizer solution, Take 2.5 mg by nebulization every 6 (six) hours as needed., Disp: , Rfl:    amLODipine (NORVASC) 10 MG tablet, Take 10 mg by mouth daily with lunch. , Disp: , Rfl:    aspirin EC 81 MG tablet, Take 1 tablet (81 mg total) by mouth daily. Swallow whole., Disp: 90 tablet, Rfl: 3   atorvastatin (LIPITOR) 20 MG tablet, Take 1 tablet (20 mg total) by mouth daily., Disp: 90 tablet, Rfl: 3   benzonatate (TESSALON) 200 MG capsule, Take 200 mg by mouth 3 (three) times daily., Disp: , Rfl:    chlorthalidone (HYGROTON) 25 MG tablet, Take 25 mg by mouth daily. , Disp: , Rfl:    clobetasol cream (TEMOVATE) 3.84 %, Apply 1 application topically daily. Apply a thin layer to affected areas, Disp: , Rfl: 4   fenofibrate 160 MG tablet, Take 160 mg by mouth daily., Disp: , Rfl:    fluocinonide (LIDEX) 0.05 % external solution, Apply 1 application topically See admin instructions. Apply to affected areas daily as needed for itching (Patient not taking: No sig reported), Disp: , Rfl:    Fluticasone-Umeclidin-Vilant (TRELEGY ELLIPTA) 200-62.5-25 MCG/ACT AEPB, Inhale 1 puff into the lungs daily., Disp: 180 each, Rfl: 3   glipiZIDE (GLUCOTROL) 5 MG tablet,  Take 5 mg by mouth 2 (two) times daily., Disp: , Rfl:    hydrOXYzine (ATARAX/VISTARIL) 25 MG tablet, Take 25 mg by mouth every 8 (eight) hours as needed., Disp: , Rfl:    levothyroxine (SYNTHROID, LEVOTHROID) 137 MCG tablet, Take 137 mcg by mouth daily before breakfast., Disp: , Rfl:    mometasone (ELOCON) 0.1 % cream, Apply 1 application topically every other day. , Disp: , Rfl:    montelukast (SINGULAIR) 10 MG tablet, Take 10 mg by mouth daily. , Disp: , Rfl: 4   Multiple Vitamins-Minerals (MULTIVITAMIN WITH MINERALS) tablet, Take 2 tablets by mouth daily. Gummie, Disp: , Rfl:    pantoprazole (PROTONIX) 40 MG tablet, TAKE 1 TABLET (40 MG TOTAL) BY MOUTH DAILY., Disp: 30 tablet, Rfl: 3   pioglitazone (ACTOS) 15 MG tablet, Take 15 mg by mouth daily., Disp: , Rfl:    quinapril (ACCUPRIL) 40 MG tablet, Take 40 mg by mouth daily. , Disp: , Rfl:    Soft Lens Products (B & L SENSITIVE EYES SALINE) 0.4 % SOLN, Place 1 drop into both eyes daily as needed (Dry eye)., Disp: , Rfl:    valACYclovir (VALTREX) 1000 MG tablet, Take 2 tablets by mouth as needed ([place]). , Disp: , Rfl:   Past Medical History: Past Medical History:  Diagnosis Date   Agatston coronary artery calcium score greater than 400    462 on coronary calcium score 09/2021   Arthritis  Asthma    since age 39   Cataract    Colon polyps    Complication of anesthesia    slow to wake up    COPD (chronic obstructive pulmonary disease) ([place])    COVID-19 09/2019   Diabetes mellitus    Diverticulosis    Dyspnea    with exertoin due to asthma    Esophageal stricture    Family history of adverse reaction to anesthesia    mother , daughter and son slow to wake up and  problems with N/V    GERD (gastroesophageal reflux disease)    Hemorrhoids    Hiatal hernia    History of kidney stones    Hypercholesteremia    Hypertension    Hypothyroidism    IBS (irritable bowel syndrome)    Paraesophageal hernia    PONV (postoperative  nausea and vomiting)    Stroke ([facility]CC)    no deficits    Tubular adenoma of colon     Tobacco Use: Social History   Tobacco Use  Smoking Status Former   Packs/day: 1.50   Years: 10.00   Pack years: 15.00   Types: Cigarettes   Quit date: 10/02/1996   Years since quitting: 25.1  Smokeless Tobacco Never  Tobacco Comments   quit 1997    Labs: Recent Review Flowsheet Data     Labs for ITP Cardiac and Pulmonary Rehab Latest Ref Rng & Units 07/06/2018 09/26/2018 10/02/2019 07/26/2020 10/21/2021   Cholestrol 100 - 199 mg/dL 139 - - - 131   LDLCALC 0 - 99 mg/dL 51 - - - 57   HDL >39 mg/dL 35(L) - - - 52   Trlycerides 0 - 149 mg/dL 266(H) - 168(H) - 127   Hemoglobin A1c 4.8 - 5.6 % 6.4(H) - - 6.3(H) -   TCO2 22 - 32 mmol/L - 28 - - -       Capillary Blood Glucose: Lab Results  Component Value Date   GLUCAP 78 11/11/2021   GLUCAP 131 (H) 10/21/2021   GLUCAP 132 (H) 10/21/2021   GLUCAP 129 (H) 10/02/2021   GLUCAP 168 (H) 10/02/2021    POCT Glucose     Row Name 09/30/21 1151 10/14/21 1150 10/28/21 1225 11/11/21 1520       POCT Blood Glucose   Pre-Exercise 275 mg/dL 139 mg/dL 151 mg/dL 141 mg/dL    Post-Exercise 194 mg/dL 91 mg/dL 75 mg/dL  Gave pt ginger ale to drink 78 mg/dL  Gave ginger ale to drink    Pre-Exercise #2 -- -- 106 mg/dL --             Pulmonary Assessment Scores:  Pulmonary Assessment Scores     Row Name 09/19/21 1422         ADL UCSD   ADL Phase Entry     SOB Score total 35       CAT Score   CAT Score 20       mMRC Score   mMRC Score 4             UCSD: Self-administered rating of dyspnea associated with activities of daily living (ADLs) 6-point scale (0 = "not at all" to 5 = "maximal or unable to do because of breathlessness")  Scoring Scores range from 0 to 120.  Minimally important difference is 5 units  CAT: CAT can identify the health impairment of COPD patients and is better correlated with disease progression.  CAT has a  scoring range of zero  to 40. The CAT score is classified into four groups of low (less than 10), medium (10 - 20), high (21-30) and very high (31-40) based on the impact level of disease on health status. A CAT score over 10 suggests significant symptoms.  A worsening CAT score could be explained by an exacerbation, poor medication adherence, poor inhaler technique, or progression of COPD or comorbid conditions.  CAT MCID is 2 points  mMRC: mMRC (Modified Medical Research Council) Dyspnea Scale is used to assess the degree of baseline functional disability in patients of respiratory disease due to dyspnea. No minimal important difference is established. A decrease in score of 1 point or greater is considered a positive change.   Pulmonary Function Assessment:  Pulmonary Function Assessment - 09/19/21 1425       Breath   Bilateral Breath Sounds Clear    Shortness of Breath No             Exercise Target Goals: Exercise Program Goal: Individual exercise prescription set using results from initial 6 min walk test and THRR while considering  patients activity barriers and safety.   Exercise Prescription Goal: Initial exercise prescription builds to 30-45 minutes a day of aerobic activity, 2-3 days per week.  Home exercise guidelines will be given to patient during program as part of exercise prescription that the participant will acknowledge.  Activity Barriers & Risk Stratification:  Activity Barriers & Cardiac Risk Stratification - 09/19/21 1105       Activity Barriers & Cardiac Risk Stratification   Activity Barriers Arthritis;Deconditioning;Muscular Weakness;Shortness of Breath    Cardiac Risk Stratification Low             6 Minute Walk:  6 Minute Walk     Row Name 09/19/21 1351         6 Minute Walk   Phase Initial     Distance 1518 feet     Walk Time 6 minutes     # of Rest Breaks 0     MPH 2.88     METS 2.91     RPE 11     Perceived Dyspnea  1     VO2  Peak 10.2     Symptoms No     Resting HR 86 bpm     Resting BP 120/66     Resting Oxygen Saturation  98 %     Exercise Oxygen Saturation  during 6 min walk 98 %     Max Ex. HR 109 bpm     Max Ex. BP 138/70     2 Minute Post BP 130/70       Interval HR   1 Minute HR 92     2 Minute HR 99     3 Minute HR 105     4 Minute HR 108     5 Minute HR 106     6 Minute HR 109     2 Minute Post HR 89     Interval Heart Rate? Yes       Interval Oxygen   Interval Oxygen? Yes     Baseline Oxygen Saturation % 98 %     1 Minute Oxygen Saturation % 99 %     1 Minute Liters of Oxygen 0 L     2 Minute Oxygen Saturation % 98 %     2 Minute Liters of Oxygen 0 L     3 Minute Oxygen Saturation % 98 %  3 Minute Liters of Oxygen 0 L     4 Minute Oxygen Saturation % 99 %     4 Minute Liters of Oxygen 0 L     5 Minute Oxygen Saturation % 99 %     5 Minute Liters of Oxygen 0 L     6 Minute Oxygen Saturation % 98 %     6 Minute Liters of Oxygen 0 L     2 Minute Post Oxygen Saturation % 99 %     2 Minute Post Liters of Oxygen 0 L              Oxygen Initial Assessment:  Oxygen Initial Assessment - 09/19/21 1108       Home Oxygen   Home Oxygen Device None    Sleep Oxygen Prescription None    Home Exercise Oxygen Prescription None    Home Resting Oxygen Prescription None      Initial 6 min Walk   Oxygen Used None      Program Oxygen Prescription   Program Oxygen Prescription None      Intervention   Short Term Goals To learn and exhibit compliance with exercise, home and travel O2 prescription;To learn and understand importance of monitoring SPO2 with pulse oximeter and demonstrate accurate use of the pulse oximeter.;To learn and understand importance of maintaining oxygen saturations>88%;To learn and demonstrate proper pursed lip breathing techniques or other breathing techniques. ;To learn and demonstrate proper use of respiratory medications    Long  Term Goals Exhibits compliance  with exercise, home  and travel O2 prescription;Verbalizes importance of monitoring SPO2 with pulse oximeter and return demonstration;Maintenance of O2 saturations>88%;Exhibits proper breathing techniques, such as pursed lip breathing or other method taught during program session;Compliance with respiratory medication;Demonstrates proper use of MDIs             Oxygen Re-Evaluation:  Oxygen Re-Evaluation     Row Name 10/13/21 0920 11/12/21 0940           Program Oxygen Prescription   Program Oxygen Prescription None None        Home Oxygen   Home Oxygen Device None None      Sleep Oxygen Prescription None None      Home Exercise Oxygen Prescription None None      Home Resting Oxygen Prescription None None        Goals/Expected Outcomes   Short Term Goals To learn and exhibit compliance with exercise, home and travel O2 prescription;To learn and understand importance of monitoring SPO2 with pulse oximeter and demonstrate accurate use of the pulse oximeter.;To learn and understand importance of maintaining oxygen saturations>88%;To learn and demonstrate proper pursed lip breathing techniques or other breathing techniques. ;To learn and demonstrate proper use of respiratory medications To learn and exhibit compliance with exercise, home and travel O2 prescription;To learn and understand importance of monitoring SPO2 with pulse oximeter and demonstrate accurate use of the pulse oximeter.;To learn and understand importance of maintaining oxygen saturations>88%;To learn and demonstrate proper pursed lip breathing techniques or other breathing techniques. ;To learn and demonstrate proper use of respiratory medications      Long  Term Goals Exhibits compliance with exercise, home  and travel O2 prescription;Verbalizes importance of monitoring SPO2 with pulse oximeter and return demonstration;Maintenance of O2 saturations>88%;Exhibits proper breathing techniques, such as pursed lip breathing or  other method taught during program session;Compliance with respiratory medication;Demonstrates proper use of MDIs Exhibits compliance with exercise, home  and travel O2 prescription;Verbalizes  importance of monitoring SPO2 with pulse oximeter and return demonstration;Maintenance of O2 saturations>88%;Exhibits proper breathing techniques, such as pursed lip breathing or other method taught during program session;Compliance with respiratory medication;Demonstrates proper use of MDIs      Goals/Expected Outcomes Compliance and understanding of oxygen saturation monitoring and breathing techniques to decrease shortness of breath. Compliance and understanding of oxygen saturation monitoring and breathing techniques to decrease shortness of breath.               Oxygen Discharge (Final Oxygen Re-Evaluation):  Oxygen Re-Evaluation - 11/12/21 0940       Program Oxygen Prescription   Program Oxygen Prescription None      Home Oxygen   Home Oxygen Device None    Sleep Oxygen Prescription None    Home Exercise Oxygen Prescription None    Home Resting Oxygen Prescription None      Goals/Expected Outcomes   Short Term Goals To learn and exhibit compliance with exercise, home and travel O2 prescription;To learn and understand importance of monitoring SPO2 with pulse oximeter and demonstrate accurate use of the pulse oximeter.;To learn and understand importance of maintaining oxygen saturations>88%;To learn and demonstrate proper pursed lip breathing techniques or other breathing techniques. ;To learn and demonstrate proper use of respiratory medications    Long  Term Goals Exhibits compliance with exercise, home  and travel O2 prescription;Verbalizes importance of monitoring SPO2 with pulse oximeter and return demonstration;Maintenance of O2 saturations>88%;Exhibits proper breathing techniques, such as pursed lip breathing or other method taught during program session;Compliance with respiratory  medication;Demonstrates proper use of MDIs    Goals/Expected Outcomes Compliance and understanding of oxygen saturation monitoring and breathing techniques to decrease shortness of breath.             Initial Exercise Prescription:  Initial Exercise Prescription - 09/19/21 1300       Date of Initial Exercise RX and Referring Provider   Date 09/19/21    Referring Provider [name]    Expected Discharge Date 11/20/21      Treadmill   MPH 2.5    Grade 0    Minutes 15    METs 2.91      NuStep   Level 1    SPM 70    Minutes 15      Prescription Details   Frequency (times per week) 2    Duration Progress to 30 minutes of continuous aerobic without signs/symptoms of physical distress      Intensity   THRR 40-80% of Max Heartrate 76-115    Ratings of Perceived Exertion 11-13    Perceived Dyspnea 0-4      Progression   Progression Continue progressive overload as per policy without signs/symptoms or physical distress.      Resistance Training   Training Prescription Yes    Weight Blue bands    Reps 10-15             Perform Capillary Blood Glucose checks as needed.  Exercise Prescription Changes:   Exercise Prescription Changes     Row Name 09/30/21 1100 10/14/21 1100 10/28/21 1200 11/11/21 1500       Response to Exercise   Blood Pressure (Admit) 142/66 122/66 130/60 122/60    Blood Pressure (Exercise) 140/60 130/56 164/60 150/62    Blood Pressure (Exit) 124/60 120/50 120/50 134/50    Heart Rate (Admit) 90 bpm 85 bpm 71 bpm 81 bpm    Heart Rate (Exercise) 110 bpm 100 bpm 102 bpm 105 bpm  Heart Rate (Exit) 100 bpm 82 bpm 85 bpm 85 bpm    Oxygen Saturation (Admit) 98 % 100 % 100 % 99 %    Oxygen Saturation (Exercise) 94 % 97 % 95 % 96 %    Oxygen Saturation (Exit) 95 % 98 % 97 % 96 %    Rating of Perceived Exertion (Exercise) 9 13 9 13     Perceived Dyspnea (Exercise) 1 3 1 2     Duration Continue with 30 min of aerobic exercise without signs/symptoms of  physical distress. Continue with 30 min of aerobic exercise without signs/symptoms of physical distress. Continue with 30 min of aerobic exercise without signs/symptoms of physical distress. Continue with 30 min of aerobic exercise without signs/symptoms of physical distress.    Intensity Other (comment)  40-80% of HRR THRR unchanged THRR unchanged THRR unchanged      Progression   Progression Continue to progress workloads to maintain intensity without signs/symptoms of physical distress. Continue to progress workloads to maintain intensity without signs/symptoms of physical distress. Continue to progress workloads to maintain intensity without signs/symptoms of physical distress. Continue to progress workloads to maintain intensity without signs/symptoms of physical distress.      Resistance Training   Training Prescription Yes Yes Yes Yes    Weight Blue bands Blue bands Blue bands Bliue bands    Reps 10-15 10-15 10-15 10-15    Time 12 Minutes 10 Minutes 10 Minutes 10 Minutes      Treadmill   MPH 2.2 1.8 2.6 2.9    Grade 0 0 0 0    Minutes 15 15 15 15       NuStep   Level 4 3 5 4     SPM 80 60 75 80    Minutes 15 15 15 15     METs 2.4 1.5 2.8 2.5      Home Exercise Plan   Plans to continue exercise at -- -- -- Home (comment)  Plans to use Silver Sneakers    Frequency -- -- -- Add 1 additional day to program exercise sessions.    Initial Home Exercises Provided -- -- -- 10/30/21             Exercise Comments:   Exercise Comments     Row Name 09/25/21 1158 10/30/21 1459         Exercise Comments Pt completed 1st day of exercise. She exercised for 15 min on the treadmill and 15 min on the NuStep. She tolerated both exercise modes well. Kihanna completed the warm up and cool down exercises standing without limitations. Pt is excited to start exercising. Completed home exercise plan. Kaely is currently walking outside 3 non-rehab days/wk for 5-10 min/day. I discussed with her  increasing time and decreasing frequency. I explained to her that I wanted each non-rehab session to be valuable. I mentioned doing 2x15 min sessions or 1x30 min session. She agreed with my recommendations. I told her how 30 min/day would meet physical activity guidelines. She stated that she would try increasing time. I also discussed with her exercising outside since it is cold. Baily stated that she does have access to a gym and would try going there.               Exercise Goals and Review:   Exercise Goals     Row Name 09/19/21 1357 10/13/21 0909           Exercise Goals   Increase Physical Activity Yes Yes  Intervention Provide advice, education, support and counseling about physical activity/exercise needs.;Develop an individualized exercise prescription for aerobic and resistive training based on initial evaluation findings, risk stratification, comorbidities and participant's personal goals. Provide advice, education, support and counseling about physical activity/exercise needs.;Develop an individualized exercise prescription for aerobic and resistive training based on initial evaluation findings, risk stratification, comorbidities and participant's personal goals.      Expected Outcomes Short Term: Attend rehab on a regular basis to increase amount of physical activity.;Long Term: Add in home exercise to make exercise part of routine and to increase amount of physical activity.;Long Term: Exercising regularly at least 3-5 days a week. Short Term: Attend rehab on a regular basis to increase amount of physical activity.;Long Term: Add in home exercise to make exercise part of routine and to increase amount of physical activity.;Long Term: Exercising regularly at least 3-5 days a week.      Increase Strength and Stamina Yes Yes      Intervention Provide advice, education, support and counseling about physical activity/exercise needs.;Develop an individualized exercise prescription  for aerobic and resistive training based on initial evaluation findings, risk stratification, comorbidities and participant's personal goals. Provide advice, education, support and counseling about physical activity/exercise needs.;Develop an individualized exercise prescription for aerobic and resistive training based on initial evaluation findings, risk stratification, comorbidities and participant's personal goals.      Expected Outcomes Short Term: Increase workloads from initial exercise prescription for resistance, speed, and METs.;Short Term: Perform resistance training exercises routinely during rehab and add in resistance training at home;Long Term: Improve cardiorespiratory fitness, muscular endurance and strength as measured by increased METs and functional capacity (6MWT) Short Term: Increase workloads from initial exercise prescription for resistance, speed, and METs.;Short Term: Perform resistance training exercises routinely during rehab and add in resistance training at home;Long Term: Improve cardiorespiratory fitness, muscular endurance and strength as measured by increased METs and functional capacity (6MWT)      Able to understand and use rate of perceived exertion (RPE) scale Yes Yes      Intervention Provide education and explanation on how to use RPE scale Provide education and explanation on how to use RPE scale      Expected Outcomes Short Term: Able to use RPE daily in rehab to express subjective intensity level;Long Term:  Able to use RPE to guide intensity level when exercising independently Short Term: Able to use RPE daily in rehab to express subjective intensity level;Long Term:  Able to use RPE to guide intensity level when exercising independently      Able to understand and use Dyspnea scale Yes Yes      Intervention Provide education and explanation on how to use Dyspnea scale Provide education and explanation on how to use Dyspnea scale      Expected Outcomes Short Term:  Able to use Dyspnea scale daily in rehab to express subjective sense of shortness of breath during exertion;Long Term: Able to use Dyspnea scale to guide intensity level when exercising independently Short Term: Able to use Dyspnea scale daily in rehab to express subjective sense of shortness of breath during exertion;Long Term: Able to use Dyspnea scale to guide intensity level when exercising independently      Knowledge and understanding of Target Heart Rate Range (THRR) Yes Yes      Intervention Provide education and explanation of THRR including how the numbers were predicted and where they are located for reference Provide education and explanation of THRR including how the numbers were  predicted and where they are located for reference      Expected Outcomes Short Term: Able to state/look up THRR;Short Term: Able to use daily as guideline for intensity in rehab;Long Term: Able to use THRR to govern intensity when exercising independently Short Term: Able to state/look up THRR;Short Term: Able to use daily as guideline for intensity in rehab;Long Term: Able to use THRR to govern intensity when exercising independently      Understanding of Exercise Prescription Yes Yes      Intervention Provide education, explanation, and written materials on patient's individual exercise prescription Provide education, explanation, and written materials on patient's individual exercise prescription      Expected Outcomes Short Term: Able to explain program exercise prescription;Long Term: Able to explain home exercise prescription to exercise independently Short Term: Able to explain program exercise prescription;Long Term: Able to explain home exercise prescription to exercise independently               Exercise Goals Re-Evaluation :  Exercise Goals Re-Evaluation     Row Name 10/13/21 0909 11/12/21 0934           Exercise Goal Re-Evaluation   Exercise Goals Review Increase Physical Activity;Increase  Strength and Stamina;Able to understand and use rate of perceived exertion (RPE) scale;Able to understand and use Dyspnea scale;Knowledge and understanding of Target Heart Rate Range (THRR);Understanding of Exercise Prescription Increase Physical Activity;Increase Strength and Stamina;Able to understand and use rate of perceived exertion (RPE) scale;Able to understand and use Dyspnea scale;Knowledge and understanding of Target Heart Rate Range (THRR);Understanding of Exercise Prescription      Comments Lexxi has completed 3 exericses sessions. She exercises 15 min on the treadmill and Nustep. Telicia averages 2.76 METs at 2.3 mph on the treadmill and 2.8 METs at level 4 on the Nustep. She has progressed for both exercise modes and tolerates progressions well. Jacquelyne is very motivated to exercise. She knows she is deconditioned and wants to improve her functional capacity. She enjoys exercising in Pulmonary Rehab as she has a positive attitude. Kassidie has recently missed class due to illness. Will continue to monitor and progress as able. Vendela has completed 11 exercise sessions. She has missed a few sessions due to illness. He exercise for 15 min on the treadmill and Nustep. She averages 3.2 METs at 2.9 MPH and 2.5 METs at level 4 on the Nustep. She performs the warmup and cooldown standing without limitations. Dorita has a bright attitude towards rehab and is motivated to exercise. We have discussed home exercise. 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 She understands the importance of increasing METs and exercising at home. Will continue to monitor and progress as able.      Expected Outcomes Through exercise at rehab and home, the patient will decrease shortness of breath with daily activities and feel confiden in carrying out an exercise regimen at home. Through exercise at rehab and home, the patient will decrease shortness of breath with daily activities and feel confiden  in carrying out an exercise regimen at home.               Discharge Exercise Prescription (Final Exercise Prescription Changes):  Exercise Prescription Changes - 11/11/21 1500       Response to Exercise   Blood Pressure (Admit) 122/60    Blood Pressure (Exercise) 150/62    Blood Pressure (Exit) 134/50    Heart Rate (Admit) 81 bpm  Heart Rate (Exercise) 105 bpm    Heart Rate (Exit) 85 bpm    Oxygen Saturation (Admit) 99 %    Oxygen Saturation (Exercise) 96 %    Oxygen Saturation (Exit) 96 %    Rating of Perceived Exertion (Exercise) 13    Perceived Dyspnea (Exercise) 2    Duration Continue with 30 min of aerobic exercise without signs/symptoms of physical distress.    Intensity THRR unchanged      Progression   Progression Continue to progress workloads to maintain intensity without signs/symptoms of physical distress.      Resistance Training   Training Prescription Yes    Weight Bliue bands    Reps 10-15    Time 10 Minutes      Treadmill   MPH 2.9    Grade 0    Minutes 15      NuStep   Level 4    SPM 80    Minutes 15    METs 2.5      Home Exercise Plan   Plans to continue exercise at Home (comment)   Plans to use Silver Sneakers   Frequency Add 1 additional day to program exercise sessions.    Initial Home Exercises Provided 10/30/21             Nutrition:  Target Goals: Understanding of nutrition guidelines, daily intake of sodium <1567m, cholesterol <2060m calories 30% from fat and 7% or less from saturated fats, daily to have 5 or more servings of fruits and vegetables.  Biometrics:  Pre Biometrics - 09/19/21 1129       Pre Biometrics   Grip Strength 18 kg              Nutrition Therapy Plan and Nutrition Goals:   Nutrition Assessments:  MEDIFICTS Score Key: ?70 Need to make dietary changes  40-70 Heart Healthy Diet ? 40 Therapeutic Level Cholesterol Diet   Picture Your Plate Scores: <4<54nhealthy dietary pattern with  much room for improvement. 41-50 Dietary pattern unlikely to meet recommendations for good health and room for improvement. 51-60 More healthful dietary pattern, with some room for improvement.  >60 Healthy dietary pattern, although there may be some specific behaviors that could be improved.    Nutrition Goals Re-Evaluation:   Nutrition Goals Discharge (Final Nutrition Goals Re-Evaluation):   Psychosocial: Target Goals: Acknowledge presence or absence of significant depression and/or stress, maximize coping skills, provide positive support system. Participant is able to verbalize types and ability to use techniques and skills needed for reducing stress and depression.  Initial Review & Psychosocial Screening:  Initial Psych Review & Screening - 09/19/21 1111       Initial Review   Current issues with None Identified      Family Dynamics   Good Support System? Yes   Has sister, children, and friends     Barriers   Psychosocial barriers to participate in program There are no identifiable barriers or psychosocial needs.      Screening Interventions   Interventions Encouraged to exercise             Quality of Life Scores:  Scores of 19 and below usually indicate a poorer quality of life in these areas.  A difference of  2-3 points is a clinically meaningful difference.  A difference of 2-3 points in the total score of the Quality of Life Index has been associated with significant improvement in overall quality of life, self-image, physical symptoms, and general health  in studies assessing change in quality of life.  PHQ-9: Recent Review Flowsheet Data     Depression screen [facility] 2/9 09/19/2021   Decreased Interest 0   Down, Depressed, Hopeless 0   PHQ - 2 Score 0   Altered sleeping 1    Tired, decreased energy 1    Change in appetite 0   Feeling bad or failure about yourself  0   Trouble concentrating 0   Moving slowly or fidgety/restless 0   Suicidal thoughts 0    PHQ-9 Score 2   Difficult doing work/chores Not difficult at all      Interpretation of Total Score  Total Score Depression Severity:  1-4 = Minimal depression, 5-9 = Mild depression, 10-14 = Moderate depression, 15-19 = Moderately severe depression, 20-27 = Severe depression   Psychosocial Evaluation and Intervention:   Psychosocial Re-Evaluation:  Psychosocial Re-Evaluation     Row Name 10/14/21 0930 11/19/21 1432           Psychosocial Re-Evaluation   Current issues with None Identified None Identified      Comments For Alicyn to be able to continue participating in PR without any psychosocial barriers. She is progressing well with exercise. Phil is very motivated and is enjoying PR. Kathrine is doing well in the PR program.She seems to be enjoying the program and making connections with others in her class. That she continues with no psychosocial barriers or concerns.      Expected Outcomes That Shamara will continue to progress with her exercise levels and enjoy PR. That she will remain motivated to exercise and and handle her health and stress in a healthy way. For Enda to continue to in [place] with no psychosocial issues or concerns.      Interventions Encouraged to attend Pulmonary Rehabilitation for the exercise;Encouraged to attend Cardiac Rehabilitation for the exercise Encouraged to attend Pulmonary Rehabilitation for the exercise      Continue Psychosocial Services  -- No Follow up required               Psychosocial Discharge (Final Psychosocial Re-Evaluation):  Psychosocial Re-Evaluation - 11/19/21 1432       Psychosocial Re-Evaluation   Current issues with None Identified    Comments Naeemah is doing well in the PR program.She seems to be enjoying the program and making connections with others in her class. That she continues with no psychosocial barriers or concerns.    Expected Outcomes For Aasha to continue to in PR with no psychosocial issues or concerns.     Interventions Encouraged to attend Pulmonary Rehabilitation for the exercise    Continue Psychosocial Services  No Follow up required             Education: Education Goals: Education classes will be provided on a weekly basis, covering required topics. Participant will state understanding/return demonstration of topics presented.  Learning Barriers/Preferences:  Learning Barriers/Preferences - 09/19/21 1114       Learning Barriers/Preferences   Learning Barriers None    Learning Preferences Audio;Computer/Internet;Group Instruction;Individual Instruction;Pictoral;Skilled Demonstration;Verbal Instruction;Video;Written Material             Education Topics: Risk Factor Reduction:  -Group instruction that is supported by a PowerPoint presentation. Instructor discusses the definition of a risk factor, different risk factors for pulmonary disease, and how the heart and lungs work together.   Flowsheet Row PULMONARY REHAB CHRONIC OBSTRUCTIVE PULMONARY DISEASE from 11/13/2021 in [place]  Date 11/11/21  Educator Donnetta Simpers  [  Know your numbers handout]       Nutrition for Pulmonary Patient:  -Group instruction provided by PowerPoint slides, verbal discussion, and written materials to support subject matter. The instructor gives an explanation and review of healthy diet recommendations, which includes a discussion on weight management, recommendations for fruit and vegetable consumption, as well as protein, fluid, caffeine, fiber, sodium, sugar, and alcohol. Tips for eating when patients are short of breath are discussed.   Pursed Lip Breathing:  -Group instruction that is supported by demonstration and informational handouts. Instructor discusses the benefits of pursed lip and diaphragmatic breathing and detailed demonstration on how to preform both.     Oxygen Safety:  -Group instruction provided by PowerPoint, verbal discussion, and written  material to support subject matter. There is an overview of What is Oxygen and Why do we need it.  Instructor also reviews how to create a safe environment for oxygen use, the importance of using oxygen as prescribed, and the risks of noncompliance. There is a brief discussion on traveling with oxygen and resources the patient may utilize. Flowsheet Row PULMONARY REHAB CHRONIC OBSTRUCTIVE PULMONARY DISEASE from 11/13/2021 in [place]  Date 10/02/21  Educator Donnetta Simpers  [handout]  Instruction Review Code 1- Verbalizes Understanding       Oxygen Equipment:  -Group instruction provided by [employer] Staff utilizing handouts, written materials, and equipment demonstrations. Flowsheet Row PULMONARY REHAB CHRONIC OBSTRUCTIVE PULMONARY DISEASE from 11/13/2021 in [place]  Date 09/25/21  Educator Donnetta Simpers  [Handout]  Instruction Review Code 1- Verbalizes Understanding       Signs and Symptoms:  -Group instruction provided by written material and verbal discussion to support subject matter. Warning signs and symptoms of infection, stroke, and heart attack are reviewed and when to call the physician/911 reinforced. Tips for preventing the spread of infection discussed.   Advanced Directives:  -Group instruction provided by verbal instruction and written material to support subject matter. Instructor reviews Advanced Directive laws and proper instruction for filling out document.   Pulmonary Video:  -Group video education that reviews the importance of medication and oxygen compliance, exercise, good nutrition, pulmonary hygiene, and pursed lip and diaphragmatic breathing for the pulmonary patient.   Exercise for the Pulmonary Patient:  -Group instruction that is supported by a PowerPoint presentation. Instructor discusses benefits of exercise, core components of exercise, frequency, duration, and intensity of an exercise  routine, importance of utilizing pulse oximetry during exercise, safety while exercising, and options of places to exercise outside of rehab.   Flowsheet Row PULMONARY REHAB CHRONIC OBSTRUCTIVE PULMONARY DISEASE from 11/13/2021 in [place]  Date 11/13/21  Educator Donnetta Simpers  [Handout]       Pulmonary Medications:  -Verbally interactive group education provided by instructor with focus on inhaled medications and proper administration.   Anatomy and Physiology of the Respiratory System and Intimacy:  -Group instruction provided by PowerPoint, verbal discussion, and written material to support subject matter. Instructor reviews respiratory cycle and anatomical components of the respiratory system and their functions. Instructor also reviews differences in obstructive and restrictive respiratory diseases with examples of each. Intimacy, Sex, and Sexuality differences are reviewed with a discussion on how relationships can change when diagnosed with pulmonary disease. Common sexual concerns are reviewed.   MD DAY -A group question and answer session with a medical doctor that allows participants to ask questions that relate to their pulmonary disease state.   OTHER EDUCATION -  Group or individual verbal, written, or video instructions that support the educational goals of the pulmonary rehab program. [place] from 11/13/2021 in [place]  Date 10/30/21  Educator Serita Sheller up and cooldown h/o]  Instruction Review Code 1- Verbalizes Understanding       Holiday Eating Survival Tips:  -Group instruction provided by PowerPoint slides, verbal discussion, and written materials to support subject matter. The instructor gives patients tips, tricks, and techniques to help them not only survive but enjoy the holidays despite the onslaught of food that accompanies the  holidays.   Knowledge Questionnaire Score:  Knowledge Questionnaire Score - 09/19/21 1132       Knowledge Questionnaire Score   Pre Score 14/18             Core Components/Risk Factors/Patient Goals at Admission:  Personal Goals and Risk Factors at Admission - 09/19/21 1116       Core Components/Risk Factors/Patient Goals on Admission    Weight Management Weight Loss    Improve shortness of breath with ADL's Yes    Intervention Provide education, individualized exercise plan and daily activity instruction to help decrease symptoms of SOB with activities of daily living.    Expected Outcomes Short Term: Improve cardiorespiratory fitness to achieve a reduction of symptoms when performing ADLs;Long Term: Be able to perform more ADLs without symptoms or delay the onset of symptoms             Core Components/Risk Factors/Patient Goals Review:   Goals and Risk Factor Review     Row Name 10/14/21 0948 10/14/21 0950 11/19/21 1437         Core Components/Risk Factors/Patient Goals Review   Personal Goals Review Weight Management/Obesity;Improve shortness of breath with ADL's;Develop more efficient breathing techniques such as purse lipped breathing and diaphragmatic breathing and practicing self-pacing with activity.;Increase knowledge of respiratory medications and ability to use respiratory devices properly. Weight Management/Obesity;Improve shortness of breath with ADL's;Develop more efficient breathing techniques such as purse lipped breathing and diaphragmatic breathing and practicing self-pacing with activity.;Increase knowledge of respiratory medications and ability to use respiratory devices properly.;Diabetes;Hypertension;Lipids;Stress Weight Management/Obesity;Improve shortness of breath with ADL's;Develop more efficient breathing techniques such as purse lipped breathing and diaphragmatic breathing and practicing self-pacing with activity.;Increase knowledge of respiratory  medications and ability to use respiratory devices properly.;Diabetes     Review -- Miamor has attended 3 exercise sessions and had a slight drop in her weight. She voices that she wants to continue to lose weight and get active with exercising. Her blood sugars have been doing well along with her BP. Daijah is very mindful of her diet and states that she following her diabetic diet.Surena's husband was a former PR patient and this has motivated her to want to participate in the program. Even though she has lost her husband, she has used his experience here to continue to motivate her and handle the stress of losong him in a postive way.She has moved to a condo and she is considering what  she will do when she needs more assistances in the future with her living situation and her health. Donnarae has wanted to lose weight but her weight has remained the same.She has progressed with her exercise on the treadmill and nu step. Her workloads and METS levels have increased. She is on room air with oxygen saturations being 95-98%. She reports her SOB to be mild to moderately difficult with exercise.  She has had several times that her blood sugar has dropped after exercise and we had to treat her with ginger ale.She has an appointment with her PCP on 11/24/21. She is going to discuss this with them to see if she can reduce her diabetic medication since she has become more active.     Expected Outcomes -- see initial core components See admission goals. For [name] to contine to progress with her exercise levels and METS with control of her SOB, and maintaining oxygen saturations in normal limits.Blood sugars in normal range.              Core Components/Risk Factors/Patient Goals at Discharge (Final Review):   Goals and Risk Factor Review - 11/19/21 1437       Core Components/Risk Factors/Patient Goals Review   Personal Goals Review Weight Management/Obesity;Improve shortness of breath with ADL's;Develop more  efficient breathing techniques such as purse lipped breathing and diaphragmatic breathing and practicing self-pacing with activity.;Increase knowledge of respiratory medications and ability to use respiratory devices properly.;Diabetes    Review Jazzelle has wanted to lose weight but her weight has remained the same.She has progressed with her exercise on the treadmill and nu step. Her workloads and METS levels have increased. She is on room air with oxygen saturations being 95-98%. She reports her SOB to be mild to moderately difficult with exercise. She has had several times that her blood sugar has dropped after exercise and we had to treat her with ginger ale.She has an appointment with her PCP on 11/24/21. She is going to discuss this with them to see if she can reduce her diabetic medication since she has become more active.    Expected Outcomes See admission goals. For [name] to contine to progress with her exercise levels and METS with control of her SOB, and maintaining oxygen saturations in normal limits.Blood sugars in normal range.             ITP Comments:   Comments: ITP REVIEW Pt is making expected progress toward pulmonary rehab goals after completing 13 sessions. Recommend continued exercise, life style modification, education, and utilization of breathing techniques to increase stamina and strength and decrease shortness of breath with exertion.  Dr. Rodman Pickle is Medical Director for Pulmonary Rehab at [facility].

## 2021-11-20 ENCOUNTER — Other Ambulatory Visit: Payer: Self-pay

## 2021-11-20 ENCOUNTER — Encounter (HOSPITAL_COMMUNITY)
Admission: RE | Admit: 2021-11-20 | Discharge: 2021-11-20 | Disposition: A | Discharge status: Home / Self Care | Payer: Medicare Other | Source: Ambulatory Visit | Attending: Internal Medicine | Admitting: Internal Medicine

## 2021-11-20 DIAGNOSIS — J449 Chronic obstructive pulmonary disease, unspecified: Secondary | ICD-10-CM

## 2021-11-20 DIAGNOSIS — J439 Emphysema, unspecified: Secondary | ICD-10-CM | POA: Diagnosis not present

## 2021-11-20 LAB — GLUCOSE, CAPILLARY: Glucose-Capillary: 213 mg/dL — ABNORMAL HIGH (ref 70–99)

## 2021-11-20 NOTE — Progress Notes (Signed)
Daily Session Note  Patient Details  Name: Kelly Mooney MRN: [id number] Date of Birth: Oct 27, 1946 Referring Provider:   April Mooney Pulmonary Rehab Walk Test from 09/19/2021 in [place]  Referring Provider Kelly Mooney       Encounter Date: 11/20/2021  Check In:  Session Check In - 11/20/21 1422       Check-In   Supervising physician immediately available to respond to emergencies [facility] immediately available    Physician(s) Dr. Florencia Mooney    Location [facility]    Staff Present Kelly Mooney BS, ACSM EP-C, Exercise Physiologist;Kelly Brislin Clarisa Schools, MS, ACSM-CEP, Exercise Physiologist    Virtual Visit No    Medication changes reported     No    Fall or balance concerns reported    No    Tobacco Cessation No Change    Warm-up and Cool-down Performed as group-led instruction    Resistance Training Performed Yes    VAD Patient? No    PAD/SET Patient? No      Pain Assessment   Currently in Pain? No/denies    Multiple Pain Sites No             Capillary Blood Glucose: Results for orders placed or performed during the hospital encounter of 11/20/21 (from the past 24 hour(s))  Glucose, capillary     Status: Abnormal   Collection Time: 11/20/21  2:16 PM  Result Value Ref Range   Glucose-Capillary 213 (H) 70 - 99 mg/dL      Social History   Tobacco Use  Smoking Status Former   Packs/day: 1.50   Years: 10.00   Pack years: 15.00   Types: Cigarettes   Quit date: 10/02/1996   Years since quitting: 25.1  Smokeless Tobacco Never  Tobacco Comments   quit 1997    Goals Met:  Exercise tolerated well No report of concerns or symptoms today Strength training completed today  Goals Unmet:  Not Applicable  Comments: Service time is from 1318 to [place]    Dr. Rodman Mooney is Medical Director for Pulmonary Rehab at [facility].

## 2021-11-21 ENCOUNTER — Telehealth: Payer: Self-pay | Admitting: Internal Medicine

## 2021-11-24 NOTE — Telephone Encounter (Signed)
Call returned to patient, confirmed DOB. Patient made aware of MD recommendations. She wants to know why we are reluctant to give her the CT stating if she can't depend on [place] to follow through on her health she will have to find another provider. I made her aware that is her choice however we can set up an appt to discuss in further detail. She still wants to know why she cant have the CT prior to the appt. Declined sooner appt stating she was leaving to go out of town.   ND please advise pt wants to know why she can't get the CT. We did make an appt for 1/26. Thanks

## 2021-11-24 NOTE — Telephone Encounter (Signed)
Call made to patient, confirmed DOB. Patient requesting CT scan stating she is not happy with DX of COPD and Asthma and Chronic Bronchitis. She states she has not had a CT to confirm these diagnosis. She has had pneumonia 3 times in 2022 and wants to be sure she is being treated for correct Dx. States these DX cannot be confirmed without a CT.   ND please advise. Thanks

## 2021-11-24 NOTE — Telephone Encounter (Signed)
I've reviewed her chest xrays and pulmonary function testing and she does have chronic bronchitis. This is the correct diagnosi based on existing guidelines.  I am happy to discuss further in an appointment.

## 2021-11-25 ENCOUNTER — Encounter (HOSPITAL_COMMUNITY)
Admission: RE | Admit: 2021-11-25 | Discharge: 2021-11-25 | Disposition: A | Discharge status: Home / Self Care | Payer: Medicare Other | Source: Ambulatory Visit | Attending: Internal Medicine | Admitting: Internal Medicine

## 2021-11-25 ENCOUNTER — Other Ambulatory Visit: Payer: Self-pay

## 2021-11-25 VITALS — Wt 160.3 lb

## 2021-11-25 DIAGNOSIS — J439 Emphysema, unspecified: Secondary | ICD-10-CM | POA: Diagnosis not present

## 2021-11-25 DIAGNOSIS — J449 Chronic obstructive pulmonary disease, unspecified: Secondary | ICD-10-CM

## 2021-11-25 NOTE — Progress Notes (Signed)
Daily Session Note  Patient Details  Name: Kelly Mooney MRN: [id number] Date of Birth: [birth year] Referring Provider:   [name] Pulmonary Rehab Walk Test from 09/19/2021 in [place]  Referring Provider [name]       Encounter Date: 11/25/2021  Check In:  Session Check In - 11/25/21 1424       Check-In   Supervising physician immediately available to respond to emergencies [facility] immediately available    Physician(s) Dr. Cyndia Skeeters    Location [facility]    Staff Present Rosebud Poles, RN, Quentin Ore, MS, ACSM-CEP, Exercise Physiologist;Miasha Emmons Ysidro Evert, RN    Virtual Visit No    Medication changes reported     No    Fall or balance concerns reported    No    Tobacco Cessation No Change    Warm-up and Cool-down Performed as group-led instruction    Resistance Training Performed Yes    VAD Patient? No    PAD/SET Patient? No      Pain Assessment   Currently in Pain? No/denies    Multiple Pain Sites No             Capillary Blood Glucose: No results found for this or any previous visit (from the past 24 hour(s)).  POCT Glucose - 11/25/21 1520       POCT Blood Glucose   Pre-Exercise 150 mg/dL    Post-Exercise 119 mg/dL             Exercise Prescription Changes - 11/25/21 1500       Response to Exercise   Blood Pressure (Admit) 130/56    Blood Pressure (Exercise) 136/80    Blood Pressure (Exit) 118/56    Heart Rate (Admit) 97 bpm    Heart Rate (Exercise) 96 bpm    Heart Rate (Exit) 96 bpm    Oxygen Saturation (Admit) 96 %    Oxygen Saturation (Exercise) 94 %    Oxygen Saturation (Exit) 96 %    Rating of Perceived Exertion (Exercise) 13    Perceived Dyspnea (Exercise) 2    Duration Continue with 30 min of aerobic exercise without signs/symptoms of physical distress.    Intensity THRR unchanged      Progression   Progression Continue to progress workloads to maintain intensity without  signs/symptoms of physical distress.      Resistance Training   Training Prescription Yes    Weight Blue bands    Reps 10-15    Time 10 Minutes      Treadmill   MPH 2.2    Grade 0    Minutes 15      NuStep   Level 4    SPM 80    Minutes 15    METs 2.1             Social History   Tobacco Use  Smoking Status Former   Packs/day: 1.50   Years: 10.00   Pack years: 15.00   Types: Cigarettes   Quit date: 10/02/1996   Years since quitting: 25.1  Smokeless Tobacco Never  Tobacco Comments   quit 1997    Goals Met:  Exercise tolerated well No report of concerns or symptoms today Strength training completed today  Goals Unmet:  Not Applicable  Comments: Service time is from 1320 to [place]    Dr. Rodman Pickle is Medical Director for Pulmonary Rehab at [facility].

## 2021-11-27 ENCOUNTER — Encounter (HOSPITAL_COMMUNITY): Payer: Medicare Other

## 2021-11-27 ENCOUNTER — Telehealth (HOSPITAL_COMMUNITY): Payer: Self-pay | Admitting: Family Medicine

## 2021-12-02 ENCOUNTER — Encounter (HOSPITAL_COMMUNITY)
Admission: RE | Admit: 2021-12-02 | Discharge: 2021-12-02 | Disposition: A | Discharge status: Home / Self Care | Payer: Medicare Other | Source: Ambulatory Visit | Attending: Internal Medicine | Admitting: Internal Medicine

## 2021-12-02 ENCOUNTER — Other Ambulatory Visit: Payer: Self-pay

## 2021-12-02 DIAGNOSIS — J439 Emphysema, unspecified: Secondary | ICD-10-CM | POA: Diagnosis not present

## 2021-12-02 DIAGNOSIS — J449 Chronic obstructive pulmonary disease, unspecified: Secondary | ICD-10-CM

## 2021-12-02 NOTE — Progress Notes (Signed)
Daily Session Note  Patient Details  Name: Kelly Mooney MRN: [id number] Date of Birth: 1945-11-27 Referring Provider:   [name] Pulmonary Rehab Walk Test from 09/19/2021 in [place]  Referring Provider [name]       Encounter Date: 12/02/2021  Check In:  Session Check In - 12/02/21 1426       Check-In   Supervising physician immediately available to respond to emergencies [facility] immediately available    Physician(s) Dr. Tana Coast    Staff Present Rodney Langton, Cathleen Fears, MS, ACSM-CEP, Exercise Physiologist    Virtual Visit No    Medication changes reported     No    Fall or balance concerns reported    No    Tobacco Cessation No Change    Warm-up and Cool-down Performed as group-led instruction    Resistance Training Performed Yes    VAD Patient? No    PAD/SET Patient? No      Pain Assessment   Currently in Pain? No/denies    Multiple Pain Sites No             Capillary Blood Glucose: No results found for this or any previous visit (from the past 24 hour(s)).    Social History   Tobacco Use  Smoking Status Former   Packs/day: 1.50   Years: 10.00   Pack years: 15.00   Types: Cigarettes   Quit date: 10/02/1996   Years since quitting: 25.1  Smokeless Tobacco Never  Tobacco Comments   quit 1997    Goals Met:  Exercise tolerated well No report of concerns or symptoms today Strength training completed today  Goals Unmet:  Not Applicable  Comments: Service time is from 1314 to 1430    Dr. Rodman Pickle is Medical Director for Pulmonary Rehab at [facility].

## 2021-12-04 ENCOUNTER — Encounter (HOSPITAL_COMMUNITY)
Admission: RE | Admit: 2021-12-04 | Discharge: 2021-12-04 | Disposition: A | Discharge status: Home / Self Care | Payer: Medicare Other | Source: Ambulatory Visit | Attending: Internal Medicine | Admitting: Internal Medicine

## 2021-12-04 ENCOUNTER — Other Ambulatory Visit: Payer: Self-pay

## 2021-12-04 DIAGNOSIS — J439 Emphysema, unspecified: Secondary | ICD-10-CM | POA: Diagnosis not present

## 2021-12-04 DIAGNOSIS — J449 Chronic obstructive pulmonary disease, unspecified: Secondary | ICD-10-CM

## 2021-12-04 NOTE — Progress Notes (Signed)
Daily Session Note  Patient Details  Name: Kelly Mooney MRN: [id number] Date of Birth: [birth year] Referring Provider:   [name] Pulmonary Rehab Walk Test from 09/19/2021 in [place]  Referring Provider [name]       Encounter Date: 12/04/2021  Check In:  Session Check In - 12/04/21 1505       Check-In   Supervising physician immediately available to respond to emergencies [facility] immediately available    Physician(s) Dr. Kurtis Bushman    Location [facility]    Staff Present Rosebud Poles, RN, BSN;Lisa Ysidro Evert, Felipe Drone, RN, MHA    Virtual Visit No    Medication changes reported     No    Fall or balance concerns reported    No    Tobacco Cessation No Change    Warm-up and Cool-down Performed as group-led instruction    Resistance Training Performed Yes    VAD Patient? No    PAD/SET Patient? No      Pain Assessment   Currently in Pain? No/denies    Multiple Pain Sites No             Capillary Blood Glucose: No results found for this or any previous visit (from the past 24 hour(s)).    Social History   Tobacco Use  Smoking Status Former   Packs/day: 1.50   Years: 10.00   Pack years: 15.00   Types: Cigarettes   Quit date: 10/02/1996   Years since quitting: 25.1  Smokeless Tobacco Never  Tobacco Comments   quit 1997    Goals Met:  Proper associated with RPD/PD & O2 Sat Exercise tolerated well No report of concerns or symptoms today Strength training completed today  Goals Unmet:  Not Applicable  Comments: Service time is from 1320 to 1430    Dr. Rodman Pickle is Medical Director for Pulmonary Rehab at [facility].

## 2021-12-09 ENCOUNTER — Encounter (HOSPITAL_COMMUNITY)
Admission: RE | Admit: 2021-12-09 | Discharge: 2021-12-09 | Disposition: A | Discharge status: Home / Self Care | Payer: Medicare Other | Source: Ambulatory Visit | Attending: Internal Medicine | Admitting: Internal Medicine

## 2021-12-09 ENCOUNTER — Other Ambulatory Visit: Payer: Self-pay

## 2021-12-09 VITALS — Wt 164.0 lb

## 2021-12-09 DIAGNOSIS — J449 Chronic obstructive pulmonary disease, unspecified: Secondary | ICD-10-CM

## 2021-12-09 DIAGNOSIS — J439 Emphysema, unspecified: Secondary | ICD-10-CM | POA: Diagnosis not present

## 2021-12-09 NOTE — Progress Notes (Addendum)
Daily Session Note  Patient Details  Name: Kelly Mooney MRN: [id number] Date of Birth: 02/21/1946 Referring Provider:   [name] Pulmonary Rehab Walk Test from 09/19/2021 in [place]  Referring Provider [name]       Encounter Date: 12/09/2021  Check In:  Session Check In - 12/09/21 1429       Check-In   Supervising physician immediately available to respond to emergencies [facility] immediately available    Physician(s) Dr. Kurtis Bushman    [facility]    Staff Present Rodney Langton, Cathleen Fears, MS, ACSM-CEP, Exercise Physiologist;David Lilyan Punt, MS, ACSM-CEP, CCRP, Exercise Physiologist    Virtual Visit No    Medication changes reported     No    Fall or balance concerns reported    No    Tobacco Cessation No Change    Warm-up and Cool-down Performed as group-led instruction    Resistance Training Performed Yes    VAD Patient? No    PAD/SET Patient? No      Pain Assessment   Currently in Pain? No/denies    Pain Score 0-No pain    Multiple Pain Sites No             Capillary Blood Glucose: No results found for this or any previous visit (from the past 24 hour(s)).  POCT Glucose - 12/09/21 1502       POCT Blood Glucose   Pre-Exercise 111 mg/dL    Post-Exercise 96 mg/dL             Exercise Prescription Changes - 12/09/21 1400       Response to Exercise   Blood Pressure (Admit) 142/60    Blood Pressure (Exercise) 142/62    Blood Pressure (Exit) 130/60    Heart Rate (Admit) 91 bpm    Heart Rate (Exercise) 112 bpm    Heart Rate (Exit) 91 bpm    Oxygen Saturation (Admit) 97 %    Oxygen Saturation (Exercise) 95 %    Oxygen Saturation (Exit) 97 %    Rating of Perceived Exertion (Exercise) 7    Perceived Dyspnea (Exercise) 1    Duration Continue with 30 min of aerobic exercise without signs/symptoms of physical distress.    Intensity THRR unchanged      Progression   Progression  Continue to progress workloads to maintain intensity without signs/symptoms of physical distress.      Resistance Training   Training Prescription Yes    Weight Blue bands    Reps 10-15    Time 10 Minutes      Treadmill   MPH --   Did not do TM today     NuStep   Level 4    SPM 90    Minutes 15    METs 2.9             Social History   Tobacco Use  Smoking Status Former   Packs/day: 1.50   Years: 10.00   Pack years: 15.00   Types: Cigarettes   Quit date: 10/02/1996   Years since quitting: 25.2  Smokeless Tobacco Never  Tobacco Comments   quit 1997    Goals Met:  Proper associated with RPD/PD & O2 Sat Exercise tolerated well No report of concerns or symptoms today Strength training completed today  Goals Unmet:  Not Applicable  Comments: Service time is from 1321 to 1435. Completed 6 MWT.   Dr. Rodman Pickle is Medical Director for  Pulmonary Rehab at [facility].

## 2021-12-10 ENCOUNTER — Telehealth: Payer: Self-pay | Admitting: Internal Medicine

## 2021-12-10 NOTE — Telephone Encounter (Signed)
Called and spoke with patient.  Patient is requesting to change providers.  Patient is currently being followed by Dr. [name], but patient stated she would like to change to Dr. Lamonte Sakai.   Message routed to Dr. [name] and Dr. Lamonte Sakai to advise

## 2021-12-11 ENCOUNTER — Telehealth: Payer: Medicare Other | Admitting: Internal Medicine

## 2021-12-11 ENCOUNTER — Other Ambulatory Visit: Payer: Self-pay

## 2021-12-11 ENCOUNTER — Encounter (HOSPITAL_COMMUNITY)
Admission: RE | Admit: 2021-12-11 | Discharge: 2021-12-11 | Disposition: A | Discharge status: Home / Self Care | Payer: Medicare Other | Source: Ambulatory Visit | Attending: Internal Medicine | Admitting: Internal Medicine

## 2021-12-11 DIAGNOSIS — J449 Chronic obstructive pulmonary disease, unspecified: Secondary | ICD-10-CM

## 2021-12-11 DIAGNOSIS — J439 Emphysema, unspecified: Secondary | ICD-10-CM | POA: Diagnosis not present

## 2021-12-11 LAB — GLUCOSE, CAPILLARY
Glucose-Capillary: 76 mg/dL (ref 70–99)
Glucose-Capillary: 124 mg/dL — ABNORMAL HIGH (ref 70–99)
Glucose-Capillary: 86 mg/dL (ref 70–99)

## 2021-12-11 NOTE — Progress Notes (Signed)
[id number] Pulmonary Rehab Pt came in for her final exercise session today. Her blood sugar was 76. Kelly Mooney states that she did not eat breakfast but, she did drink a protein shake before coming. She also said that she took only her Glipizide today and not her other blood sugar medication. I gave her some ginger ale to drink. Rechecked her blood sugar in 15 minutes it was 86. Then gave pt graham crackers and peanut butter. 15 minutes later her blood sugar was 124. Pt did not exercise today due to the low blood sugar.She denied any symptoms of low blood sugar. Kelly Mooney was discharged in stable condition without any complaints.

## 2021-12-11 NOTE — Telephone Encounter (Signed)
Ok to change if dr byrum is ok

## 2021-12-11 NOTE — Telephone Encounter (Signed)
Dr. Lamonte Sakai, please advise if you are okay with pt switching to you.

## 2021-12-11 NOTE — Progress Notes (Signed)
Discharge Progress Report  Patient Details  Name: Kelly Mooney MRN: [id number] Date of Birth: 07-16-1946 Referring Provider:   [name] Pulmonary Rehab Walk Test from 09/19/2021 in [place]  Referring Provider [name]        Number of Visits: 39  Reason for Discharge:  Patient has met program and personal goals.  Smoking History:  Social History   Tobacco Use  Smoking Status Former   Packs/day: 1.50   Years: 10.00   Pack years: 15.00   Types: Cigarettes   Quit date: 10/02/1996   Years since quitting: 25.2  Smokeless Tobacco Never  Tobacco Comments   quit 1997    Diagnosis:  COPD with chronic bronchitis and emphysema ([place])  ADL UCSD:  Pulmonary Assessment Scores     Row Name 09/19/21 1422 11/25/21 1628       ADL UCSD   ADL Phase Entry Exit    SOB Score total 35 45      CAT Score   CAT Score 20 30      mMRC Score   mMRC Score 4 --             Initial Exercise Prescription:  Initial Exercise Prescription - 09/19/21 1300       Date of Initial Exercise RX and Referring Provider   Date 09/19/21    Referring Provider [name]    Expected Discharge Date 11/20/21      Treadmill   MPH 2.5    Grade 0    Minutes 15    METs 2.91      NuStep   Level 1    SPM 70    Minutes 15      Prescription Details   Frequency (times per week) 2    Duration Progress to 30 minutes of continuous aerobic without signs/symptoms of physical distress      Intensity   THRR 40-80% of Max Heartrate 76-115    Ratings of Perceived Exertion 11-13    Perceived Dyspnea 0-4      Progression   Progression Continue progressive overload as per policy without signs/symptoms or physical distress.      Resistance Training   Training Prescription Yes    Weight Blue bands    Reps 10-15             Discharge Exercise Prescription (Final Exercise Prescription Changes):  Exercise Prescription Changes - 12/09/21 1400       Response  to Exercise   Blood Pressure (Admit) 142/60    Blood Pressure (Exercise) 142/62    Blood Pressure (Exit) 130/60    Heart Rate (Admit) 91 bpm    Heart Rate (Exercise) 112 bpm    Heart Rate (Exit) 91 bpm    Oxygen Saturation (Admit) 97 %    Oxygen Saturation (Exercise) 95 %    Oxygen Saturation (Exit) 97 %    Rating of Perceived Exertion (Exercise) 7    Perceived Dyspnea (Exercise) 1    Duration Continue with 30 min of aerobic exercise without signs/symptoms of physical distress.    Intensity THRR unchanged      Progression   Progression Continue to progress workloads to maintain intensity without signs/symptoms of physical distress.      Resistance Training   Training Prescription Yes    Weight Blue bands    Reps 10-15    Time 10 Minutes      Treadmill   MPH --   Did not do TM  today     NuStep   Level 4    SPM 90    Minutes 15    METs 2.9             Functional Capacity:  6 Minute Walk     Row Name 09/19/21 1351 12/09/21 1453       6 Minute Walk   Phase Initial Discharge    Distance 1518 feet 1810 feet    Distance % Change -- 19.24 %    Distance Feet Change -- 292 ft    Walk Time 6 minutes 6 minutes    # of Rest Breaks 0 0    MPH 2.88 3.43    METS 2.91 3.6    RPE 11 13    Perceived Dyspnea  1 1    VO2 Peak 10.2 12.6    Symptoms No No    Resting HR 86 bpm 91 bpm    Resting BP 120/66 142/60    Resting Oxygen Saturation  98 % 97 %    Exercise Oxygen Saturation  during 6 min walk 98 % 93 %    Max Ex. HR 109 bpm 123 bpm    Max Ex. BP 138/70 146/78    2 Minute Post BP 130/70 130/60      Interval HR   1 Minute HR 92 106    2 Minute HR 99 114    3 Minute HR 105 115    4 Minute HR 108 121    5 Minute HR 106 121    6 Minute HR 109 123    2 Minute Post HR 89 108    Interval Heart Rate? Yes Yes      Interval Oxygen   Interval Oxygen? Yes Yes    Baseline Oxygen Saturation % 98 % 97 %    1 Minute Oxygen Saturation % 99 % 97 %    1 Minute Liters of  Oxygen 0 L 0 L    2 Minute Oxygen Saturation % 98 % 97 %    2 Minute Liters of Oxygen 0 L 0 L    3 Minute Oxygen Saturation % 98 % 97 %    3 Minute Liters of Oxygen 0 L 0 L    4 Minute Oxygen Saturation % 99 % 96 %    4 Minute Liters of Oxygen 0 L 0 L    5 Minute Oxygen Saturation % 99 % 95 %    5 Minute Liters of Oxygen 0 L 0 L    6 Minute Oxygen Saturation % 98 % 93 %    6 Minute Liters of Oxygen 0 L 0 L    2 Minute Post Oxygen Saturation % 99 % 97 %    2 Minute Post Liters of Oxygen 0 L 0 L             Psychological, QOL, Others - Outcomes: PHQ 2/9: Depression screen [facility] 2/9 12/09/2021 09/19/2021  Decreased Interest 0 0  Down, Depressed, Hopeless 0 0  PHQ - 2 Score 0 0  Altered sleeping 0 1  Tired, decreased energy 1 1  Change in appetite 0 0  Feeling bad or failure about yourself  0 0  Trouble concentrating 0 0  Moving slowly or fidgety/restless 0 0  Suicidal thoughts 0 0  PHQ-9 Score 1 2  Difficult doing work/chores Not difficult at all Not difficult at all    Quality of Life:   Personal Goals: Goals  established at orientation with interventions provided to work toward goal.  Personal Goals and Risk Factors at Admission - 09/19/21 1116       Core Components/Risk Factors/Patient Goals on Admission    Weight Management Weight Loss    Improve shortness of breath with ADL's Yes    Intervention Provide education, individualized exercise plan and daily activity instruction to help decrease symptoms of SOB with activities of daily living.    Expected Outcomes Short Term: Improve cardiorespiratory fitness to achieve a reduction of symptoms when performing ADLs;Long Term: Be able to perform more ADLs without symptoms or delay the onset of symptoms              Personal Goals Discharge:  Goals and Risk Factor Review     Row Name 10/14/21 0948 10/14/21 0950 11/19/21 1437         Core Components/Risk Factors/Patient Goals Review   Personal Goals Review Weight  Management/Obesity;Improve shortness of breath with ADL's;Develop more efficient breathing techniques such as purse lipped breathing and diaphragmatic breathing and practicing self-pacing with activity.;Increase knowledge of respiratory medications and ability to use respiratory devices properly. Weight Management/Obesity;Improve shortness of breath with ADL's;Develop more efficient breathing techniques such as purse lipped breathing and diaphragmatic breathing and practicing self-pacing with activity.;Increase knowledge of respiratory medications and ability to use respiratory devices properly.;Diabetes;Hypertension;Lipids;Stress Weight Management/Obesity;Improve shortness of breath with ADL's;Develop more efficient breathing techniques such as purse lipped breathing and diaphragmatic breathing and practicing self-pacing with activity.;Increase knowledge of respiratory medications and ability to use respiratory devices properly.;Diabetes     Review -- Lenna has attended 3 exercise sessions and had a slight drop in her weight. She voices that she wants to continue to lose weight and get active with exercising. Her blood sugars have been doing well along with her BP. Tiare is very mindful of her diet and states that she following her diabetic diet.Chanin's husband was a former [facility] patient and this has motivated her to want to participate in the program. Even though she has lost her husband, she has used his experience here to continue to motivate her and handle the stress of losong him in a postive way.She has moved to a condo and she is considering what  she will do when she needs more assistances in the future with her living situation and her health. Yitzel has wanted to lose weight but her weight has remained the same.She has progressed with her exercise on the treadmill and nu step. Her workloads and METS levels have increased. She is on room air with oxygen saturations being 95-98%. She reports her SOB to be mild  to moderately difficult with exercise. She has had several times that her blood sugar has dropped after exercise and we had to treat her with ginger ale.She has an appointment with her PCP on 11/24/21. She is going to discuss this with them to see if she can reduce her diabetic medication since she has become more active.     Expected Outcomes -- see initial core components See admission goals. For [name] to contine to progress with her exercise levels and METS with control of her SOB, and maintaining oxygen saturations in normal limits.Blood sugars in normal range.              Exercise Goals and Review:  Exercise Goals     Row Name 09/19/21 1357 10/13/21 0909           Exercise Goals   Increase Physical Activity Yes  Yes      Intervention Provide advice, education, support and counseling about physical activity/exercise needs.;Develop an individualized exercise prescription for aerobic and resistive training based on initial evaluation findings, risk stratification, comorbidities and participant's personal goals. Provide advice, education, support and counseling about physical activity/exercise needs.;Develop an individualized exercise prescription for aerobic and resistive training based on initial evaluation findings, risk stratification, comorbidities and participant's personal goals.      Expected Outcomes Short Term: Attend rehab on a regular basis to increase amount of physical activity.;Long Term: Add in home exercise to make exercise part of routine and to increase amount of physical activity.;Long Term: Exercising regularly at least 3-5 days a week. Short Term: Attend rehab on a regular basis to increase amount of physical activity.;Long Term: Add in home exercise to make exercise part of routine and to increase amount of physical activity.;Long Term: Exercising regularly at least 3-5 days a week.      Increase Strength and Stamina Yes Yes      Intervention Provide advice, education,  support and counseling about physical activity/exercise needs.;Develop an individualized exercise prescription for aerobic and resistive training based on initial evaluation findings, risk stratification, comorbidities and participant's personal goals. Provide advice, education, support and counseling about physical activity/exercise needs.;Develop an individualized exercise prescription for aerobic and resistive training based on initial evaluation findings, risk stratification, comorbidities and participant's personal goals.      Expected Outcomes Short Term: Increase workloads from initial exercise prescription for resistance, speed, and METs.;Short Term: Perform resistance training exercises routinely during rehab and add in resistance training at home;Long Term: Improve cardiorespiratory fitness, muscular endurance and strength as measured by increased METs and functional capacity (6MWT) Short Term: Increase workloads from initial exercise prescription for resistance, speed, and METs.;Short Term: Perform resistance training exercises routinely during rehab and add in resistance training at home;Long Term: Improve cardiorespiratory fitness, muscular endurance and strength as measured by increased METs and functional capacity (6MWT)      Able to understand and use rate of perceived exertion (RPE) scale Yes Yes      Intervention Provide education and explanation on how to use RPE scale Provide education and explanation on how to use RPE scale      Expected Outcomes Short Term: Able to use RPE daily in rehab to express subjective intensity level;Long Term:  Able to use RPE to guide intensity level when exercising independently Short Term: Able to use RPE daily in rehab to express subjective intensity level;Long Term:  Able to use RPE to guide intensity level when exercising independently      Able to understand and use Dyspnea scale Yes Yes      Intervention Provide education and explanation on how to use  Dyspnea scale Provide education and explanation on how to use Dyspnea scale      Expected Outcomes Short Term: Able to use Dyspnea scale daily in rehab to express subjective sense of shortness of breath during exertion;Long Term: Able to use Dyspnea scale to guide intensity level when exercising independently Short Term: Able to use Dyspnea scale daily in rehab to express subjective sense of shortness of breath during exertion;Long Term: Able to use Dyspnea scale to guide intensity level when exercising independently      Knowledge and understanding of Target Heart Rate Range (THRR) Yes Yes      Intervention Provide education and explanation of THRR including how the numbers were predicted and where they are located for reference Provide education and explanation of  THRR including how the numbers were predicted and where they are located for reference      Expected Outcomes Short Term: Able to state/look up THRR;Short Term: Able to use daily as guideline for intensity in rehab;Long Term: Able to use THRR to govern intensity when exercising independently Short Term: Able to state/look up THRR;Short Term: Able to use daily as guideline for intensity in rehab;Long Term: Able to use THRR to govern intensity when exercising independently      Understanding of Exercise Prescription Yes Yes      Intervention Provide education, explanation, and written materials on patient's individual exercise prescription Provide education, explanation, and written materials on patient's individual exercise prescription      Expected Outcomes Short Term: Able to explain program exercise prescription;Long Term: Able to explain home exercise prescription to exercise independently Short Term: Able to explain program exercise prescription;Long Term: Able to explain home exercise prescription to exercise independently               Exercise Goals Re-Evaluation:  Exercise Goals Re-Evaluation     Row Name 10/13/21 0909 11/12/21  0934           Exercise Goal Re-Evaluation   Exercise Goals Review Increase Physical Activity;Increase Strength and Stamina;Able to understand and use rate of perceived exertion (RPE) scale;Able to understand and use Dyspnea scale;Knowledge and understanding of Target Heart Rate Range (THRR);Understanding of Exercise Prescription Increase Physical Activity;Increase Strength and Stamina;Able to understand and use rate of perceived exertion (RPE) scale;Able to understand and use Dyspnea scale;Knowledge and understanding of Target Heart Rate Range (THRR);Understanding of Exercise Prescription      Comments Malene has completed 3 exericses sessions. She exercises 15 min on the treadmill and Nustep. Nastasia averages 2.76 METs at 2.3 mph on the treadmill and 2.8 METs at level 4 on the Nustep. She has progressed for both exercise modes and tolerates progressions well. Keonia is very motivated to exercise. She knows she is deconditioned and wants to improve her functional capacity. She enjoys exercising in Pulmonary Rehab as she has a positive attitude. Shaneeka has recently missed class due to illness. Will continue to monitor and progress as able. Linnae has completed 11 exercise sessions. She has missed a few sessions due to illness. He exercise for 15 min on the treadmill and Nustep. She averages 3.2 METs at 2.9 MPH and 2.5 METs at level 4 on the Nustep. She performs the warmup and cooldown standing without limitations. Nickia has a bright attitude towards rehab and is motivated to exercise. We have discussed home exercise. Alazay has [employer], international aid and is planning to use that to join a fitness center. She understands the importance of increasing METs and exercising at home. Will continue to monitor and progress as able.      Expected Outcomes Through exercise at rehab and home, the patient will decrease shortness of breath with daily activities and feel confiden in carrying out an exercise regimen at home. Through  exercise at rehab and home, the patient will decrease shortness of breath with daily activities and feel confiden in carrying out an exercise regimen at home.               Nutrition & Weight - Outcomes:  Pre Biometrics - 09/19/21 1129       Pre Biometrics   Grip Strength 18 kg             Post Biometrics - 12/09/21 1357  Post  Biometrics   Weight 74.4 kg    Grip Strength 17 kg             Nutrition:   Nutrition Discharge:   Education Questionnaire Score:  Knowledge Questionnaire Score - 11/25/21 1629       Knowledge Questionnaire Score   Post Score 17/18             Goals reviewed with patient; copy given to patient.

## 2021-12-12 NOTE — Telephone Encounter (Signed)
ATC patient to notify. LMTCB

## 2021-12-12 NOTE — Telephone Encounter (Signed)
Yes, Ok with me

## 2021-12-16 NOTE — Telephone Encounter (Signed)
Spoke with the pt and she has already been scheduled for appt with Dr Lamonte Sakai for 01/07/22. Nothing further needed.

## 2022-01-02 ENCOUNTER — Ambulatory Visit
Admission: RE | Admit: 2022-01-02 | Discharge: 2022-01-02 | Disposition: A | Discharge status: Home / Self Care | Payer: Medicare Other | Source: Ambulatory Visit | Attending: Family Medicine | Admitting: Family Medicine

## 2022-01-02 DIAGNOSIS — Z1231 Encounter for screening mammogram for malignant neoplasm of breast: Secondary | ICD-10-CM

## 2022-01-02 DIAGNOSIS — E2839 Other primary ovarian failure: Secondary | ICD-10-CM

## 2022-01-07 ENCOUNTER — Ambulatory Visit (INDEPENDENT_AMBULATORY_CARE_PROVIDER_SITE_OTHER): Payer: Medicare Other | Admitting: Emergency Medicine

## 2022-01-07 ENCOUNTER — Encounter: Payer: Self-pay | Admitting: Emergency Medicine

## 2022-01-07 ENCOUNTER — Other Ambulatory Visit: Payer: Self-pay

## 2022-01-07 VITALS — BP 132/68 | HR 79 | Temp 98.1°F | Ht 61.0 in | Wt 165.8 lb

## 2022-01-07 DIAGNOSIS — J301 Allergic rhinitis due to pollen: Secondary | ICD-10-CM

## 2022-01-07 DIAGNOSIS — K219 Gastro-esophageal reflux disease without esophagitis: Secondary | ICD-10-CM

## 2022-01-07 DIAGNOSIS — R053 Chronic cough: Secondary | ICD-10-CM | POA: Diagnosis not present

## 2022-01-07 DIAGNOSIS — J479 Bronchiectasis, uncomplicated: Secondary | ICD-10-CM | POA: Diagnosis not present

## 2022-01-07 DIAGNOSIS — J449 Chronic obstructive pulmonary disease, unspecified: Secondary | ICD-10-CM

## 2022-01-07 DIAGNOSIS — J309 Allergic rhinitis, unspecified: Secondary | ICD-10-CM | POA: Insufficient documentation

## 2022-01-07 MED ORDER — BREZTRI AEROSPHERE 160-9-4.8 MCG/ACT IN AERO: 2.0000 | INHALATION_SPRAY | Freq: Two times a day (BID) | RESPIRATORY_TRACT | 0 refills | Status: DC

## 2022-01-07 NOTE — Assessment & Plan Note (Signed)
Continue Protonix as you have been taking it 

## 2022-01-07 NOTE — Patient Instructions (Signed)
We reviewed your pulmonary function testing today. Please temporarily stop Trelegy.  We will try starting Breztri 2 puffs twice a day.  Rinse and gargle after using.  Keep track of whether you feel this medication helps you.  If so we can talk about continuing it going forward. Keep albuterol available to use 2 puffs or 1 nebulizer treatment up to every 4 hours if needed for shortness of breath, chest tightness, wheezing. Continue your Singulair 10 mg each evening Please continue fluticasone nasal spray 2 sprays each nostril as needed for congestion.  You could consider taking this medication more reliably to see if it helps your overall symptoms, breathing Continue Protonix as you have been taking it. We will arrange for a high-resolution CT scan of the chest to evaluate for airways inflammation or bronchiectasis. Follow with Dr Lamonte Sakai next available after your CT scan so we can review the results together.

## 2022-01-07 NOTE — Assessment & Plan Note (Signed)
We reviewed your pulmonary function testing today. Please temporarily stop Trelegy.  We will try starting Breztri 2 puffs twice a day.  Rinse and gargle after using.  Keep track of whether you feel this medication helps you.  If so we can talk about continuing it going forward. Keep albuterol available to use 2 puffs or 1 nebulizer treatment up to every 4 hours if needed for shortness of breath, chest tightness, wheezing. Follow with Kelly Mooney next available after your CT scan so we can review the results together.

## 2022-01-07 NOTE — Assessment & Plan Note (Signed)
Continue your Singulair 10 mg each evening Please continue fluticasone nasal spray 2 sprays each nostril as needed for congestion.  You could consider taking this medication more reliably to see if it helps your overall symptoms, breathing

## 2022-01-07 NOTE — Progress Notes (Signed)
Subjective:    Patient ID: Kelly Mooney, female    DOB: 10/17/1946, 76 y.o.   MRN: [id number]  HPI 76 year old former smoker (15 pack years) with a history of childhood asthma and COPD, diabetes, GERD with esophageal strictures, hypertension, hypothyroidism, chronic rhinitis and congestion.  Currently managed on Trelegy 200, Singulair, Protonix daily.  She has albuterol which she uses 2x a day. She has good functional capacity. She brings up colored mucous every day, usually green.   Pulmonary function testing performed 04/21/2021 shows moderately severe obstruction with a borderline bronchodilator response, hyperinflated lung volumes and a normal diffusion capacity.  Cardiac scoring CT chest 10/15/2021 reviewed by me showed some scarring at both bases without any opacities or effusions.  No significant emphysematous change.  She has a large hiatal hernia without any mediastinal adenopathy.   Review of Systems As per HPI  Past Medical History:  Diagnosis Date   Agatston coronary artery calcium score greater than 400    462 on coronary calcium score 09/2021   Arthritis    Asthma    since age 72   Cataract    Colon polyps    Complication of anesthesia    slow to wake up    COPD (chronic obstructive pulmonary disease) ([place])    COVID-19 09/2019   Diabetes mellitus    Diverticulosis    Dyspnea    with exertoin due to asthma    Esophageal stricture    Family history of adverse reaction to anesthesia    mother , daughter and son slow to wake up and  problems with N/V    GERD (gastroesophageal reflux disease)    Hemorrhoids    Hiatal hernia    History of kidney stones    Hypercholesteremia    Hypertension    Hypothyroidism    IBS (irritable bowel syndrome)    Paraesophageal hernia    PONV (postoperative nausea and vomiting)    Stroke (HCC)    no deficits    Tubular adenoma of colon      Family History  Problem Relation Age of Onset   Diabetes Mellitus I Mother     Hyperlipidemia Mother    Asthma Mother    Heart attack Father    Heart attack Brother    Asthma Brother    Heart attack Brother    Colon cancer Neg Hx    Colon polyps Neg Hx    Esophageal cancer Neg Hx    Rectal cancer Neg Hx    Stomach cancer Neg Hx      Social History   Socioeconomic History   Marital status: Widowed    Spouse name: Not on file   Number of children: 2   Years of education: 60   Highest education level: Not on file  Occupational History   Occupation: Retired  Tobacco Use   Smoking status: Former    Packs/day: 1.50    Years: 10.00    Pack years: 15.00    Types: Cigarettes    Quit date: 10/02/1996    Years since quitting: 25.2   Smokeless tobacco: Never   Tobacco comments:    quit 1997  Vaping Use   Vaping Use: Never used  Substance and Sexual Activity   Alcohol use: No    Alcohol/week: 0.0 standard drinks   Drug use: No   Sexual activity: Not on file  Other Topics Concern   Not on file  Social History Narrative   Not on file  Social Determinants of Health   Financial Resource Strain: Not on file  Food Insecurity: Not on file  Transportation Needs: Not on file  Physical Activity: Not on file  Stress: Not on file  Social Connections: Not on file  Intimate Partner Violence: Not on file     Allergies  Allergen Reactions   Chloramphenicol Anaphylaxis   Ivp Dye [Iodinated Contrast Media] Anaphylaxis, Shortness Of Breath and Other (See Comments)    SEVERE convulsions, too    Crestor [Rosuvastatin Calcium]     Muscle cramps   Iodine Itching   Sulfa Antibiotics Rash     Outpatient Medications Prior to Visit  Medication Sig Dispense Refill   albuterol (PROVENTIL HFA;VENTOLIN HFA) 108 (90 BASE) MCG/ACT inhaler Inhale 2 puffs into the lungs 4 (four) times daily.      albuterol (PROVENTIL) (2.5 MG/3ML) 0.083% nebulizer solution Take 2.5 mg by nebulization every 6 (six) hours as needed.     amLODipine (NORVASC) 10 MG tablet Take 10 mg by  mouth daily with lunch.      aspirin EC 81 MG tablet Take 1 tablet (81 mg total) by mouth daily. Swallow whole. 90 tablet 3   atorvastatin (LIPITOR) 20 MG tablet Take 1 tablet (20 mg total) by mouth daily. 90 tablet 3   benzonatate (TESSALON) 200 MG capsule Take 200 mg by mouth 3 (three) times daily.     chlorthalidone (HYGROTON) 25 MG tablet Take 25 mg by mouth daily.      clobetasol cream (TEMOVATE) 9.89 % Apply 1 application topically daily. Apply a thin layer to affected areas  4   fenofibrate 160 MG tablet Take 160 mg by mouth daily.     fluocinonide (LIDEX) 0.05 % external solution Apply 1 application topically See admin instructions. Apply to affected areas daily as needed for itching     Fluticasone-Umeclidin-Vilant (TRELEGY ELLIPTA) 200-62.5-25 MCG/ACT AEPB Inhale 1 puff into the lungs daily. 180 each 3   glipiZIDE (GLUCOTROL) 5 MG tablet Take 5 mg by mouth 2 (two) times daily.     hydrOXYzine (ATARAX/VISTARIL) 25 MG tablet Take 25 mg by mouth every 8 (eight) hours as needed.     levothyroxine (SYNTHROID, LEVOTHROID) 137 MCG tablet Take 137 mcg by mouth daily before breakfast.     mometasone (ELOCON) 0.1 % cream Apply 1 application topically every other day.      montelukast (SINGULAIR) 10 MG tablet Take 10 mg by mouth daily.   4   Multiple Vitamins-Minerals (MULTIVITAMIN WITH MINERALS) tablet Take 2 tablets by mouth daily. Gummie     pantoprazole (PROTONIX) 40 MG tablet TAKE 1 TABLET (40 MG TOTAL) BY MOUTH DAILY. 30 tablet 3   pioglitazone (ACTOS) 15 MG tablet Take 15 mg by mouth daily.     Soft Lens Products (B & L SENSITIVE EYES SALINE) 0.4 % SOLN Place 1 drop into both eyes daily as needed (Dry eye).     valACYclovir (VALTREX) 1000 MG tablet Take 2 tablets by mouth as needed ([place]).      DULERA 100-5 MCG/ACT AERO Inhale into the lungs.     Fluticasone-Umeclidin-Vilant (TRELEGY ELLIPTA) 200-62.5-25 MCG/ACT AEPB 1 puff     quinapril (ACCUPRIL) 40 MG tablet Take 40 mg by mouth  daily.      No facility-administered medications prior to visit.         Objective:   Physical Exam Vitals:   01/07/22 1131  BP: 132/68  Pulse: 79  Temp: 98.1 F (36.7 C)  TempSrc: Oral  SpO2: 98%  Weight: 165 lb 12.8 oz (75.2 kg)  Height: 5\' 1"  (1.549 m)   Gen: Pleasant, well-nourished, in no distress,  normal affect  ENT: No lesions,  mouth clear,  oropharynx clear, no postnasal drip  Neck: No JVD, no stridor  Lungs: No use of accessory muscles, no crackles or wheezing on normal respiration, no wheeze on forced expiration  Cardiovascular: RRR, heart sounds normal, no murmur or gallops, no peripheral edema  Musculoskeletal: No deformities, no cyanosis or clubbing  Neuro: alert, awake, non focal  Skin: Warm, no lesions or rash      Assessment & Plan:   COPD (chronic obstructive pulmonary disease) (HCC) We reviewed your pulmonary function testing today. Please temporarily stop Trelegy.  We will try starting Breztri 2 puffs twice a day.  Rinse and gargle after using.  Keep track of whether you feel this medication helps you.  If so we can talk about continuing it going forward. Keep albuterol available to use 2 puffs or 1 nebulizer treatment up to every 4 hours if needed for shortness of breath, chest tightness, wheezing. Follow with Dr Lamonte Sakai next available after your CT scan so we can review the results together.  Chronic cough Component of upper airway irritation.  Contributors include GERD, chronic rhinitis.  We will try to treat these as aggressively as possible.  She is at risk for bronchiectasis, does have some purulent sputum every day.  I think it is reasonable for [place] to check a CT chest to evaluate for any evidence of bronchiectasis, atypical mycobacterial disease, etc.  We will arrange for a high-resolution CT scan of the chest to evaluate for airways inflammation or bronchiectasis.  GERD (gastroesophageal reflux disease) Continue Protonix as you have been  taking it.  Allergic rhinitis Continue your Singulair 10 mg each evening Please continue fluticasone nasal spray 2 sprays each nostril as needed for congestion.  You could consider taking this medication more reliably to see if it helps your overall symptoms, breathing   Baltazar Apo, MD, PhD 01/07/2022, 2:06 PM [facility] [phone] or if no answer before 7:00PM call [phone] For any issues after 7:00PM please call eLink [phone]

## 2022-01-07 NOTE — Assessment & Plan Note (Signed)
Component of upper airway irritation.  Contributors include GERD, chronic rhinitis.  We will try to treat these as aggressively as possible.  She is at risk for bronchiectasis, does have some purulent sputum every day.  I think it is reasonable for [place] to check a CT chest to evaluate for any evidence of bronchiectasis, atypical mycobacterial disease, etc.  We will arrange for a high-resolution CT scan of the chest to evaluate for airways inflammation or bronchiectasis.

## 2022-01-08 NOTE — Telephone Encounter (Signed)
Pt seen on 2/22 by Dr. Lamonte Sakai. Will close encounter.

## 2022-01-27 ENCOUNTER — Telehealth: Payer: Self-pay | Admitting: Emergency Medicine

## 2022-01-27 ENCOUNTER — Other Ambulatory Visit: Payer: Medicare Other

## 2022-01-27 MED ORDER — BREZTRI AEROSPHERE 160-9-4.8 MCG/ACT IN AERO: 2.0000 | INHALATION_SPRAY | Freq: Two times a day (BID) | RESPIRATORY_TRACT | 0 refills | Status: DC

## 2022-01-27 MED ORDER — MOLNUPIRAVIR EUA 200MG CAPSULE: 4.0000 | ORAL_CAPSULE | Freq: Two times a day (BID) | ORAL | 0 refills | Status: AC

## 2022-01-27 NOTE — Telephone Encounter (Signed)
Lm x1 for patient.  

## 2022-01-27 NOTE — Telephone Encounter (Signed)
I believe we should start molpuniravir '800mg'$  bid x 5 days ?She needs to call this week to let us know how her breathing is doing. She may need other meds if not responding.  ?

## 2022-01-27 NOTE — Telephone Encounter (Signed)
Spoke to patient.  ?She stated that she tested positive for covid this morning.  ?C/o headache, increased sob, diarrhea and non prod cough. Sx developed last night. ?Denied f/c/s or additional sx.  ?She does not wear supplemental oxygen. ?Fully vaccinated against covid and flu.  ?She is using Breztri BID and albuterol BID. ? ?Dr. Lamonte Sakai, please advise.  ? ? ?

## 2022-01-27 NOTE — Telephone Encounter (Signed)
Patient is returning phone call. Patient phone number is [phone]. ?

## 2022-01-27 NOTE — Telephone Encounter (Signed)
Patient is aware of recommendations and voiced her understanding. ?Molnupiravir and Breztri sent to preferred pharmacy per request. Patient feels that Judithann Sauger is effective.  ?Nothing further needed.  ? ?

## 2022-01-29 ENCOUNTER — Telehealth: Payer: Self-pay | Admitting: Emergency Medicine

## 2022-01-29 MED ORDER — DOXYCYCLINE HYCLATE 100 MG PO TABS: 100.0000 mg | ORAL_TABLET | Freq: Two times a day (BID) | ORAL | 0 refills | Status: DC

## 2022-01-29 NOTE — Telephone Encounter (Signed)
Called and spoke to pt. Informed her of the recs per RB. Rx sent to preferred pharmacy. Pt verbalized understanding and denied any further questions or concerns at this time.  ? ?

## 2022-01-29 NOTE — Telephone Encounter (Signed)
Agree, ok to treat with doxycycline '100mg'$  bid x 7 days ?

## 2022-01-29 NOTE — Telephone Encounter (Signed)
Please refer to 01/27/2022 phone note.  ? ?Patient recently dx with covid and tx with Molnupiravir. She will complete course on Friday.  ?She is experiencing lingering sx and she is concerned that she needs an abx. ?C/o sob with exertion and laying flat, prod cough with yellow sputum and wheezing with laying flat. ?Denied f/c/s or additional sx. ?No supplemental oxygen. Spo2 maintaining around 92%. ? ?Dr. Lamonte Sakai, please advise.  ?

## 2022-02-03 ENCOUNTER — Ambulatory Visit: Payer: Medicare Other | Admitting: Emergency Medicine

## 2022-02-10 ENCOUNTER — Other Ambulatory Visit: Payer: Self-pay

## 2022-02-10 ENCOUNTER — Ambulatory Visit (INDEPENDENT_AMBULATORY_CARE_PROVIDER_SITE_OTHER)
Admission: RE | Admit: 2022-02-10 | Discharge: 2022-02-10 | Disposition: A | Discharge status: Home / Self Care | Payer: Medicare Other | Source: Ambulatory Visit | Attending: Emergency Medicine | Admitting: Emergency Medicine

## 2022-02-10 DIAGNOSIS — J479 Bronchiectasis, uncomplicated: Secondary | ICD-10-CM | POA: Diagnosis not present

## 2022-02-13 ENCOUNTER — Encounter: Payer: Self-pay | Admitting: Emergency Medicine

## 2022-02-13 ENCOUNTER — Ambulatory Visit (INDEPENDENT_AMBULATORY_CARE_PROVIDER_SITE_OTHER): Payer: Medicare Other | Admitting: Emergency Medicine

## 2022-02-13 VITALS — BP 142/78 | HR 84 | Temp 98.4°F | Ht 61.0 in | Wt 169.8 lb

## 2022-02-13 DIAGNOSIS — R932 Abnormal findings on diagnostic imaging of liver and biliary tract: Secondary | ICD-10-CM

## 2022-02-13 DIAGNOSIS — J849 Interstitial pulmonary disease, unspecified: Secondary | ICD-10-CM

## 2022-02-13 DIAGNOSIS — J449 Chronic obstructive pulmonary disease, unspecified: Secondary | ICD-10-CM

## 2022-02-13 DIAGNOSIS — J301 Allergic rhinitis due to pollen: Secondary | ICD-10-CM

## 2022-02-13 DIAGNOSIS — R053 Chronic cough: Secondary | ICD-10-CM

## 2022-02-13 DIAGNOSIS — K219 Gastro-esophageal reflux disease without esophagitis: Secondary | ICD-10-CM

## 2022-02-13 NOTE — Patient Instructions (Signed)
We will refer you to be seen in the interstitial lung disease (ILD) clinic ?Continue Breztri 2 puffs twice a day.  Rinse and gargle after using. ?Keep albuterol available to use 2 puffs when needed for shortness of breath, chest tightness, wheezing. ?Please continue Protonix as you have been taking ?Please continue Singulair as you have been taking it ?We will perform a hepatic ultrasound to evaluate the liver abnormalities that were seen on your CT scan.  Please follow-up with Dr. Harrington Challenger about these results. ? ? ?

## 2022-02-13 NOTE — Assessment & Plan Note (Signed)
Subtle subpleural interstitial disease noted on her high-resolution CT scan of the chest.  This may be sequela from her COVID-19 from 06/2019.  That said I think it would be reasonable to transfer her to the ILD clinic for further evaluation, autoimmune work-up, serial imaging.  She is agreeable and would like to be seen in the ILD clinic. ?

## 2022-02-13 NOTE — Assessment & Plan Note (Signed)
Improved.  Suspect multifactorial with some component of obstructive lung disease, upper airway irritation due to GERD and rhinitis.  She may have also been dealing with some upper airway effects of the powdered Trelegy.  Seems to be better on the nonpowdered bronchodilator. ?

## 2022-02-13 NOTE — Assessment & Plan Note (Signed)
Continue pantoprazole. °

## 2022-02-13 NOTE — Assessment & Plan Note (Signed)
She did benefit from the change from Trelegy to [place].  Less upper airway noise or cough as well.  Plan to continue same. ?

## 2022-02-13 NOTE — Assessment & Plan Note (Signed)
Continue current regimen

## 2022-02-13 NOTE — Progress Notes (Signed)
? ?Subjective:  ? ? Patient ID: Kelly Mooney, female    DOB: [birth year], 76 y.o.   MRN: [id number] ? ?HPI ?76 year old former smoker (15 pack years) with a history of childhood asthma and COPD, diabetes, GERD with esophageal strictures, hypertension, hypothyroidism, chronic rhinitis and congestion. ? ?Currently managed on Trelegy 200, Singulair, Protonix daily.  She has albuterol which she uses 2x a day. She has good functional capacity. She brings up colored mucous every day, usually green.  ? ?Pulmonary function testing performed 04/21/2021 shows moderately severe obstruction with a borderline bronchodilator response, hyperinflated lung volumes and a normal diffusion capacity. ? ?Cardiac scoring CT chest 10/15/2021 reviewed by me showed some scarring at both bases without any opacities or effusions.  No significant emphysematous change.  She has a large hiatal hernia without any mediastinal adenopathy. ? ? ?ROV 02/13/22 --follow-up visit 76 year old woman with a history of childhood asthma and now COPD (moderately severe obstruction).  Also with a history of diabetes, GERD with a large hiatal hernia, hypertension, hypothyroidism, chronic rhinitis and congestion.  She had COVID 19 in 06/2019.  She deals with chronic cough.  We checked a CT chest to evaluate for evidence of bronchiectasis, atypical mycobacterial disease as below.  At her last visit I tried changing her Trelegy to [place]. She believes that she has benefited. Protonix, singulair.  ? ?High-resolution CT scan of the chest 02/10/2022 reviewed by me shows some irregular peripheral interstitial opacity and septal thickening with an apical to basal gradient and some minimal subpleural bronchiolectasis.  No significant air trapping, probable UIP pattern.  Coarse nodular contour of the liver suggestive of possible cirrhotic change ? ? ?Review of Systems ?As per HPI ? ?Past Medical History:  ?Diagnosis Date  ? Agatston coronary artery calcium score greater than  400   ? 462 on coronary calcium score 09/2021  ? Arthritis   ? Asthma   ? since age 1  ? Cataract   ? Colon polyps   ? Complication of anesthesia   ? slow to wake up   ? COPD (chronic obstructive pulmonary disease) ([place])   ? COVID-19 09/2019  ? Diabetes mellitus   ? Diverticulosis   ? Dyspnea   ? with exertoin due to asthma   ? Esophageal stricture   ? Family history of adverse reaction to anesthesia   ? mother , daughter and son slow to wake up and  problems with N/V   ? GERD (gastroesophageal reflux disease)   ? Hemorrhoids   ? Hiatal hernia   ? History of kidney stones   ? Hypercholesteremia   ? Hypertension   ? Hypothyroidism   ? IBS (irritable bowel syndrome)   ? Paraesophageal hernia   ? PONV (postoperative nausea and vomiting)   ? Stroke [facility])   ? no deficits   ? Tubular adenoma of colon   ?  ? ?Family History  ?Problem Relation Age of Onset  ? Diabetes Mellitus I Mother   ? Hyperlipidemia Mother   ? Asthma Mother   ? Heart attack Father   ? Heart attack Brother   ? Asthma Brother   ? Heart attack Brother   ? Colon cancer Neg Hx   ? Colon polyps Neg Hx   ? Esophageal cancer Neg Hx   ? Rectal cancer Neg Hx   ? Stomach cancer Neg Hx   ?  ? ?Social History  ? ?Socioeconomic History  ? Marital status: Widowed  ?  Spouse name: Not on  file  ? Number of children: 2  ? Years of education: 75  ? Highest education level: Not on file  ?Occupational History  ? Occupation: Retired  ?Tobacco Use  ? Smoking status: Former  ?  Packs/day: 1.50  ?  Years: 10.00  ?  Pack years: 15.00  ?  Types: Cigarettes  ?  Quit date: 10/02/1996  ?  Years since quitting: 25.3  ? Smokeless tobacco: Never  ? Tobacco comments:  ?  quit 1997  ?Vaping Use  ? Vaping Use: Never used  ?Substance and Sexual Activity  ? Alcohol use: No  ?  Alcohol/week: 0.0 standard drinks  ? Drug use: No  ? Sexual activity: Not on file  ?Other Topics Concern  ? Not on file  ?Social History Narrative  ? Not on file  ? ?Social Determinants of Health  ? ?Financial  Resource Strain: Not on file  ?Food Insecurity: Not on file  ?Transportation Needs: Not on file  ?Physical Activity: Not on file  ?Stress: Not on file  ?Social Connections: Not on file  ?Intimate Partner Violence: Not on file  ?  ? ?Allergies  ?Allergen Reactions  ? Chloramphenicol Anaphylaxis  ? Ivp Dye [Iodinated Contrast Media] Anaphylaxis, Shortness Of Breath and Other (See Comments)  ?  SEVERE convulsions, too ?  ? Crestor [Rosuvastatin Calcium]   ?  Muscle cramps  ? Iodine Itching  ? Sulfa Antibiotics Rash  ?  ? ?Outpatient Medications Prior to Visit  ?Medication Sig Dispense Refill  ? albuterol (PROVENTIL HFA;VENTOLIN HFA) 108 (90 BASE) MCG/ACT inhaler Inhale 2 puffs into the lungs 4 (four) times daily.     ? albuterol (PROVENTIL) (2.5 MG/3ML) 0.083% nebulizer solution Take 2.5 mg by nebulization every 6 (six) hours as needed.    ? amLODipine (NORVASC) 10 MG tablet Take 10 mg by mouth daily with lunch.     ? aspirin EC 81 MG tablet Take 1 tablet (81 mg total) by mouth daily. Swallow whole. 90 tablet 3  ? atorvastatin (LIPITOR) 20 MG tablet Take 1 tablet (20 mg total) by mouth daily. 90 tablet 3  ? benzonatate (TESSALON) 200 MG capsule Take 200 mg by mouth 3 (three) times daily.    ? Budeson-Glycopyrrol-Formoterol (BREZTRI AEROSPHERE) 160-9-4.8 MCG/ACT AERO Inhale 2 puffs into the lungs in the morning and at bedtime. 32.1 g 0  ? chlorthalidone (HYGROTON) 25 MG tablet Take 25 mg by mouth daily.     ? clobetasol cream (TEMOVATE) 2.97 % Apply 1 application topically daily. Apply a thin layer to affected areas  4  ? doxycycline (VIBRA-TABS) 100 MG tablet Take 1 tablet (100 mg total) by mouth 2 (two) times daily. 14 tablet 0  ? fenofibrate 160 MG tablet Take 160 mg by mouth daily.    ? glipiZIDE (GLUCOTROL) 5 MG tablet Take 5 mg by mouth 2 (two) times daily.    ? hydrOXYzine (ATARAX/VISTARIL) 25 MG tablet Take 25 mg by mouth every 8 (eight) hours as needed.    ? levothyroxine (SYNTHROID, LEVOTHROID) 137 MCG tablet  Take 137 mcg by mouth daily before breakfast.    ? mometasone (ELOCON) 0.1 % cream Apply 1 application topically every other day.     ? montelukast (SINGULAIR) 10 MG tablet Take 10 mg by mouth daily.   4  ? Multiple Vitamins-Minerals (MULTIVITAMIN WITH MINERALS) tablet Take 2 tablets by mouth daily. Gummie    ? pantoprazole (PROTONIX) 40 MG tablet TAKE 1 TABLET (40 MG TOTAL) BY MOUTH DAILY.  30 tablet 3  ? pioglitazone (ACTOS) 15 MG tablet Take 15 mg by mouth daily.    ? Soft Lens Products (B & L SENSITIVE EYES SALINE) 0.4 % SOLN Place 1 drop into both eyes daily as needed (Dry eye).    ? valACYclovir (VALTREX) 1000 MG tablet Take 2 tablets by mouth as needed (Cold [place]ore).     ? fluocinonide (LIDEX) 0.05 % external solution Apply 1 application topically See admin instructions. Apply to affected areas daily as needed for itching    ? ?No facility-administered medications prior to visit.  ? ? ? ? ?   ?Objective:  ? Physical Exam ?Vitals:  ? 02/13/22 1600  ?BP: (!) 142/78  ?Pulse: 84  ?Temp: 98.4 ?F (36.9 ?C)  ?TempSrc: Oral  ?SpO2: 97%  ?Weight: 169 lb 12.8 oz (77 kg)  ?Height: '5\' 1"'$  (1.549 m)  ? ?Gen: Pleasant, well-nourished, in no distress,  normal affect ? ?ENT: No lesions,  mouth clear,  oropharynx clear, no postnasal drip ? ?Neck: No JVD, no stridor ? ?Lungs: No use of accessory muscles, no crackles or wheezing on normal respiration, no wheeze on forced expiration ? ?Cardiovascular: RRR, heart sounds normal, no murmur or gallops, no peripheral edema ? ?Musculoskeletal: No deformities, no cyanosis or clubbing ? ?Neuro: alert, awake, non focal ? ?Skin: Warm, no lesions or rash ? ? ?   ?Assessment & Plan:  ? ?ILD (interstitial lung disease) ([place]) ?Subtle subpleural interstitial disease noted on her high-resolution CT scan of the chest.  This may be sequela from her COVID-19 from 06/2019.  That said I think it would be reasonable to transfer her to the ILD clinic for further evaluation, autoimmune work-up, serial  imaging.  She is agreeable and would like to be seen in the ILD clinic. ? ?COPD (chronic obstructive pulmonary disease) ([place]) ?She did benefit from the change from Trelegy to [place].  Less upper airway noise or

## 2022-02-19 ENCOUNTER — Ambulatory Visit
Admission: RE | Admit: 2022-02-19 | Discharge: 2022-02-19 | Disposition: A | Discharge status: Home / Self Care | Payer: Medicare Other | Source: Ambulatory Visit | Attending: Emergency Medicine | Admitting: Emergency Medicine

## 2022-02-19 DIAGNOSIS — R932 Abnormal findings on diagnostic imaging of liver and biliary tract: Secondary | ICD-10-CM

## 2022-03-02 ENCOUNTER — Encounter: Payer: Self-pay | Admitting: Emergency Medicine

## 2022-03-12 ENCOUNTER — Other Ambulatory Visit: Payer: Medicare Other

## 2022-03-12 ENCOUNTER — Ambulatory Visit (INDEPENDENT_AMBULATORY_CARE_PROVIDER_SITE_OTHER): Payer: Medicare Other | Admitting: Pulmonary Disease

## 2022-03-12 ENCOUNTER — Encounter: Payer: Self-pay | Admitting: Pulmonary Disease

## 2022-03-12 VITALS — BP 142/60 | HR 78 | Temp 98.4°F | Ht 61.0 in | Wt 166.6 lb

## 2022-03-12 DIAGNOSIS — J849 Interstitial pulmonary disease, unspecified: Secondary | ICD-10-CM

## 2022-03-12 MED ORDER — PREDNISONE 20 MG PO TABS: ORAL_TABLET | ORAL | 0 refills | Status: DC

## 2022-03-12 MED ORDER — AZITHROMYCIN 250 MG PO TABS: ORAL_TABLET | ORAL | 0 refills | Status: DC

## 2022-03-12 NOTE — Patient Instructions (Signed)
We will get some labs to further evaluate the mild scarring that we see in your lungs ?Continue the inhalers ?We will call in Z-Pak and prednisone 40 mg a day for 5 days ?Follow-up in 3 months. ?

## 2022-03-12 NOTE — Progress Notes (Signed)
? ?      ?Kelly Mooney    [id number]    05-Sep-1946 ? ?Primary Care Physician:Ross, Dwyane Luo, MD ? ?Referring Physician: Lawerance Cruel, MD ?[facility] ?[place],  [place] [address] ? ?Chief complaint: Consult for interstitial lung disease ? ?HPI: ?76 year old with history of asthma, COPD, hiatal hernia with GERD, esophageal stricture.  Previously followed by Dr. Lamonte Sakai.  Has been referred for evaluation of high-resolution CT showing interstitial lung disease ? ?Reports mild dyspnea on exertion, cough which is nonproductive.  Over the past few days she has increasing cough and congestion with wheezing. ? ?She follows with Dr. Henrene Pastor for esophageal stricture and is on a PPI.  She continues to have difficulty swallowing and symptoms of heartburn.  History also notable for COVID-19 in November 2020 for which she was hospitalized. ?She has joint pains, morning stiffness, ulnar deviation of the digits and a strong family history of rheumatoid arthritis ? ?Imaging so far has also suggested cirrhosis and liver lesions which turned out to be benign cysts on follow-up ultrasound of liver ? ?Pets: No pets ?Occupation: Retired Surveyor, quantity ?Exposures: No mold, hot tub, Jacuzzi.  No feather pillows or comforters ?ILD questionnaire 03/12/22-negative for exposure ?Smoking history: 10-pack-year smoker.  Quit in 1997 ?Travel history: No significant travel history ?Relevant family history: No family history of lung disease ? ?Outpatient Encounter Medications as of 03/12/2022  ?Medication Sig  ? albuterol (PROVENTIL HFA;VENTOLIN HFA) 108 (90 BASE) MCG/ACT inhaler Inhale 2 puffs into the lungs 4 (four) times daily.   ? albuterol (PROVENTIL) (2.5 MG/3ML) 0.083% nebulizer solution Take 2.5 mg by nebulization every 6 (six) hours as needed.  ? amLODipine (NORVASC) 10 MG tablet Take 10 mg by mouth daily with lunch.   ? aspirin EC 81 MG tablet Take 1 tablet (81 mg total) by mouth daily. Swallow whole.  ? atorvastatin  (LIPITOR) 20 MG tablet Take 1 tablet (20 mg total) by mouth daily.  ? benzonatate (TESSALON) 200 MG capsule Take 200 mg by mouth 3 (three) times daily.  ? Budeson-Glycopyrrol-Formoterol (BREZTRI AEROSPHERE) 160-9-4.8 MCG/ACT AERO Inhale 2 puffs into the lungs in the morning and at bedtime.  ? chlorthalidone (HYGROTON) 25 MG tablet Take 25 mg by mouth daily.   ? clobetasol cream (TEMOVATE) 8.18 % Apply 1 application topically daily. Apply a thin layer to affected areas  ? fenofibrate 160 MG tablet Take 160 mg by mouth daily.  ? glipiZIDE (GLUCOTROL) 5 MG tablet Take 5 mg by mouth 2 (two) times daily.  ? hydrOXYzine (ATARAX/VISTARIL) 25 MG tablet Take 25 mg by mouth every 8 (eight) hours as needed.  ? irbesartan (AVAPRO) 300 MG tablet   ? levothyroxine (SYNTHROID, LEVOTHROID) 137 MCG tablet Take 137 mcg by mouth daily before breakfast.  ? mometasone (ELOCON) 0.1 % cream Apply 1 application topically every other day.   ? montelukast (SINGULAIR) 10 MG tablet Take 10 mg by mouth daily.   ? Multiple Vitamins-Minerals (MULTIVITAMIN WITH MINERALS) tablet Take 2 tablets by mouth daily. Gummie  ? pantoprazole (PROTONIX) 40 MG tablet TAKE 1 TABLET (40 MG TOTAL) BY MOUTH DAILY.  ? pioglitazone (ACTOS) 15 MG tablet Take 15 mg by mouth daily.  ? Soft Lens Products (B & L SENSITIVE EYES SALINE) 0.4 % SOLN Place 1 drop into both eyes daily as needed (Dry eye).  ? valACYclovir (VALTREX) 1000 MG tablet Take 2 tablets by mouth as needed (Cold Sore).   ? fluocinonide (LIDEX) 0.05 % external solution Apply  1 application topically See admin instructions. Apply to affected areas daily as needed for itching  ? [DISCONTINUED] doxycycline (VIBRA-TABS) 100 MG tablet Take 1 tablet (100 mg total) by mouth 2 (two) times daily.  ? ?No facility-administered encounter medications on file as of 03/12/2022.  ? ? ?Allergies as of 03/12/2022 - Review Complete 03/12/2022  ?Allergen Reaction Noted  ? Chloramphenicol Anaphylaxis 07/05/2018  ? Ivp dye  [iodinated contrast media] Anaphylaxis, Shortness Of Breath, and Other (See Comments) 06/14/2012  ? Crestor [rosuvastatin calcium]  08/04/2018  ? Iodine Itching 06/12/2015  ? Sulfa antibiotics Rash 07/05/2018  ? ? ?Past Medical History:  ?Diagnosis Date  ? Agatston coronary artery calcium score greater than 400   ? 462 on coronary calcium score 09/2021  ? Arthritis   ? Asthma   ? since age 38  ? Cataract   ? Colon polyps   ? Complication of anesthesia   ? slow to wake up   ? COPD (chronic obstructive pulmonary disease) ([place])   ? COVID-19 09/2019  ? Diabetes mellitus   ? Diverticulosis   ? Dyspnea   ? with exertoin due to asthma   ? Esophageal stricture   ? Family history of adverse reaction to anesthesia   ? mother , daughter and son slow to wake up and  problems with N/V   ? GERD (gastroesophageal reflux disease)   ? Hemorrhoids   ? Hiatal hernia   ? History of kidney stones   ? Hypercholesteremia   ? Hypertension   ? Hypothyroidism   ? IBS (irritable bowel syndrome)   ? Paraesophageal hernia   ? PONV (postoperative nausea and vomiting)   ? Stroke [facility])   ? no deficits   ? Tubular adenoma of colon   ? ? ?Past Surgical History:  ?Procedure Laterality Date  ? ABDOMINAL HYSTERECTOMY    ? BUNIONECTOMY Right 10/2012  ? CATARACT EXTRACTION, BILATERAL    ? CHOLECYSTECTOMY  1980  ? ESOPHAGEAL MANOMETRY N/A 07/15/2015  ? Procedure: ESOPHAGEAL MANOMETRY (EM);  Surgeon: Irene Shipper, MD;  Location: [facility];  Service: Endoscopy;  Laterality: N/A;  ? HEMORRHOID SURGERY N/A 08/01/2020  ? Procedure: HEMORRHOIDECTOMY, HEMORRHOIDAL LIGATION /PEXY ANORECTAL EXAMINATION UNDER ANESTHESIA;  Surgeon: Michael Boston, MD;  Location: [facility] ORS;  Service: General;  Laterality: N/A;  LOCAL  ? PARTIAL HYSTERECTOMY    ? RHINOPLASTY    ? nasal polyps removed dr. Barrie Folk  ? ? ?Family History  ?Problem Relation Age of Onset  ? Diabetes Mellitus I Mother   ? Hyperlipidemia Mother   ? Asthma Mother   ? Heart attack Father   ? Heart attack Brother   ?  Asthma Brother   ? Heart attack Brother   ? Colon cancer Neg Hx   ? Colon polyps Neg Hx   ? Esophageal cancer Neg Hx   ? Rectal cancer Neg Hx   ? Stomach cancer Neg Hx   ? ? ?Social History  ? ?Socioeconomic History  ? Marital status: Widowed  ?  Spouse name: Not on file  ? Number of children: 2  ? Years of education: 49  ? Highest education level: Not on file  ?Occupational History  ? Occupation: Retired  ?Tobacco Use  ? Smoking status: Former  ?  Packs/day: 1.50  ?  Years: 10.00  ?  Pack years: 15.00  ?  Types: Cigarettes  ?  Quit date: 10/02/1996  ?  Years since quitting: 25.4  ? Smokeless tobacco: Never  ?  Tobacco comments:  ?  quit 1997  ?Vaping Use  ? Vaping Use: Never used  ?Substance and Sexual Activity  ? Alcohol use: No  ?  Alcohol/week: 0.0 standard drinks  ? Drug use: No  ? Sexual activity: Not on file  ?Other Topics Concern  ? Not on file  ?Social History Narrative  ? Not on file  ? ?Social Determinants of Health  ? ?Financial Resource Strain: Not on file  ?Food Insecurity: Not on file  ?Transportation Needs: Not on file  ?Physical Activity: Not on file  ?Stress: Not on file  ?Social Connections: Not on file  ?Intimate Partner Violence: Not on file  ? ? ?Review of systems: ?Review of Systems  ?Constitutional: Negative for fever and chills.  ?HENT: Negative.   ?Eyes: Negative for blurred vision.  ?Respiratory: as per HPI  ?Cardiovascular: Negative for chest pain and palpitations.  ?Gastrointestinal: Negative for vomiting, diarrhea, blood per rectum. ?Genitourinary: Negative for dysuria, urgency, frequency and hematuria.  ?Musculoskeletal: Negative for myalgias, back pain and joint pain.  ?Skin: Negative for itching and rash.  ?Neurological: Negative for dizziness, tremors, focal weakness, seizures and loss of consciousness.  ?Endo/Heme/Allergies: Negative for environmental allergies.  ?Psychiatric/Behavioral: Negative for depression, suicidal ideas and hallucinations.  ?All other systems reviewed and are  negative. ? ?Physical Exam: ?Blood pressure (!) 142/60, pulse 78, temperature 98.4 ?F (36.9 ?C), temperature source Oral, height '5\' 1"'$  (1.549 m), weight 166 lb 9.6 oz (75.6 kg), SpO2 97 %. ?Gen:      No acute

## 2022-03-15 ENCOUNTER — Encounter: Payer: Self-pay | Admitting: Emergency Medicine

## 2022-03-16 MED ORDER — BREZTRI AEROSPHERE 160-9-4.8 MCG/ACT IN AERO: 2.0000 | INHALATION_SPRAY | Freq: Two times a day (BID) | RESPIRATORY_TRACT | 2 refills | Status: DC

## 2022-03-20 LAB — ANCA SCREEN W REFLEX TITER: ANCA SCREEN: NEGATIVE

## 2022-03-20 LAB — SJOGREN'S SYNDROME ANTIBODS(SSA + SSB)
SSA (Ro) (ENA) Antibody, IgG: <1 AI
SSB (La) (ENA) Antibody, IgG: <1 AI

## 2022-03-20 LAB — ANTI-SCLERODERMA ANTIBODY: Scleroderma (Scl-70) (ENA) Antibody, IgG: <1 AI

## 2022-03-20 LAB — ANA,IFA RA DIAG PNL W/RFLX TIT/PATN
Anti Nuclear Antibody (ANA): NEGATIVE
Cyclic Citrullin Peptide Ab: <16 UNITS
Rheumatoid fact SerPl-aCnc: <14 IU/mL (ref <14)

## 2022-03-25 ENCOUNTER — Encounter: Payer: Self-pay | Admitting: Emergency Medicine

## 2022-03-28 LAB — MYOMARKER 3 PLUS PROFILE (RDL)

## 2022-04-11 LAB — HYPERSENSITIVITY PNEUMONITIS
A. Pullulans Abs: NEGATIVE
A.Fumigatus #1 Abs: NEGATIVE
Micropolyspora faeni, IgG: NEGATIVE
Pigeon Serum Abs: NEGATIVE
Thermoact. Saccharii: NEGATIVE
Thermoactinomyces vulgaris, IgG: NEGATIVE

## 2022-04-11 LAB — MYOMARKER 3 PLUS PROFILE (RDL)

## 2022-05-04 ENCOUNTER — Encounter: Payer: Self-pay | Admitting: Cardiology

## 2022-05-04 DIAGNOSIS — I6523 Occlusion and stenosis of bilateral carotid arteries: Secondary | ICD-10-CM

## 2022-05-13 ENCOUNTER — Encounter: Payer: Self-pay | Admitting: Cardiology

## 2022-05-13 ENCOUNTER — Ambulatory Visit (HOSPITAL_COMMUNITY)
Admission: RE | Admit: 2022-05-13 | Discharge: 2022-05-13 | Disposition: A | Discharge status: Home / Self Care | Payer: Medicare Other | Source: Ambulatory Visit | Attending: Cardiology | Admitting: Cardiology

## 2022-05-13 DIAGNOSIS — I6523 Occlusion and stenosis of bilateral carotid arteries: Secondary | ICD-10-CM | POA: Insufficient documentation

## 2022-06-09 IMAGING — CT CT CARDIAC CORONARY ARTERY CALCIUM SCORE
3 series · 14 of 20 positions shown, 16 images · non-contrast
Comparison: None.
COMPARISON: None.

Addendum:
EXAM:
OVER-READ INTERPRETATION  CT CHEST

The following report is an over-read performed by radiologist Dr.
Km Willams [REDACTED] on 10/15/2021. This
over-read does not include interpretation of cardiac or coronary
anatomy or pathology. The coronary calcium score interpretation by
the cardiologist is attached.
CLINICAL DATA: Cardiovascular Disease Risk stratification
Coronary Calcium Score
TECHNIQUE: A gated, non-contrast computed tomography scan of the heart was
performed using 3mm slice thickness. Axial images were analyzed on a
dedicated workstation. Calcium scoring of the coronary arteries was
performed using the Agatston method.

[Series 2: cascseq 2.0 sa36 (id) (id) · axial · 0.39mm/px · z∈[-228,-148]mm · 4 of 68 slices shown]
[im 14/68  vessel]
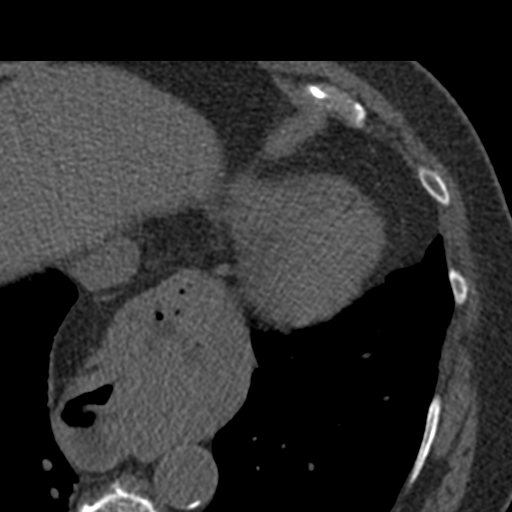
[im 27/68  vessel]
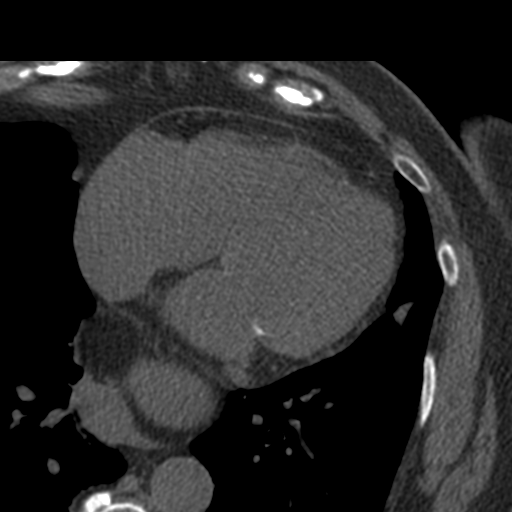
[im 41/68  vessel]
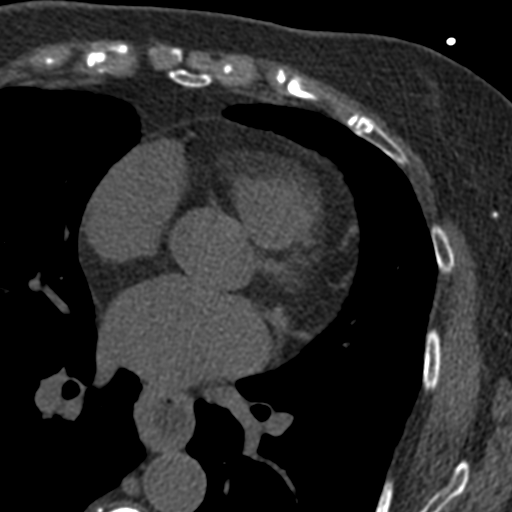
[im 54/68  vessel]
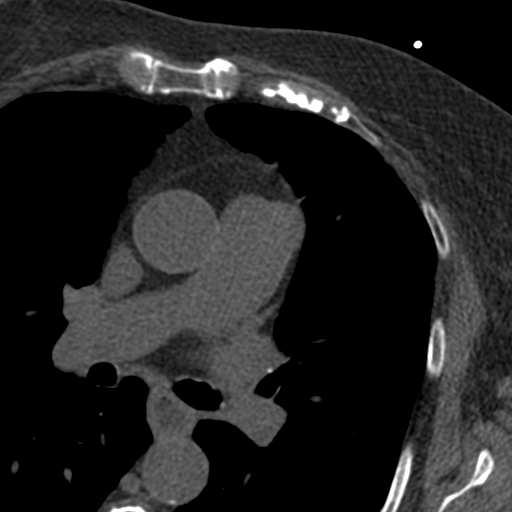

[Series 3: cascseq 2.0 bf37 st · axial · 0.69mm/px · z∈[-232,-144]mm · 5 of 68 slices shown, 7 images]
[im 12/68  vessel]
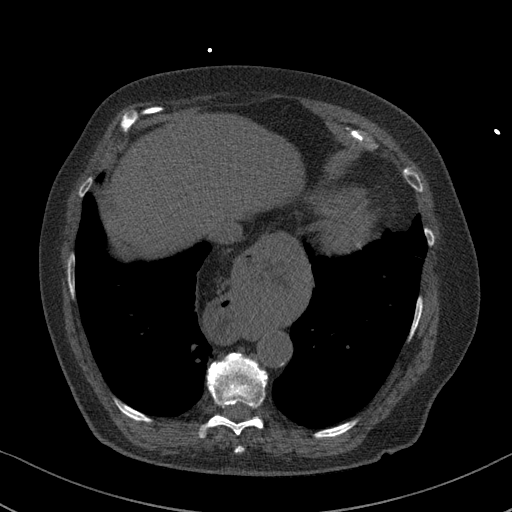
[im 12/68  lung]
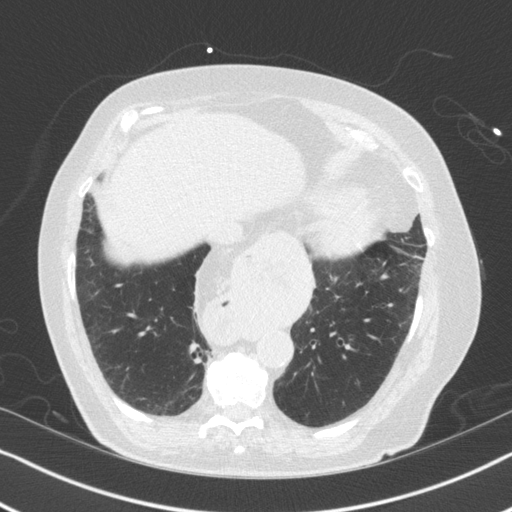
[im 23/68  vessel]
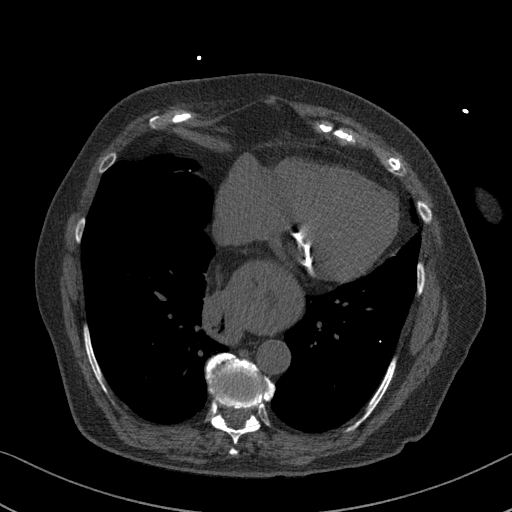
[im 34/68  vessel]
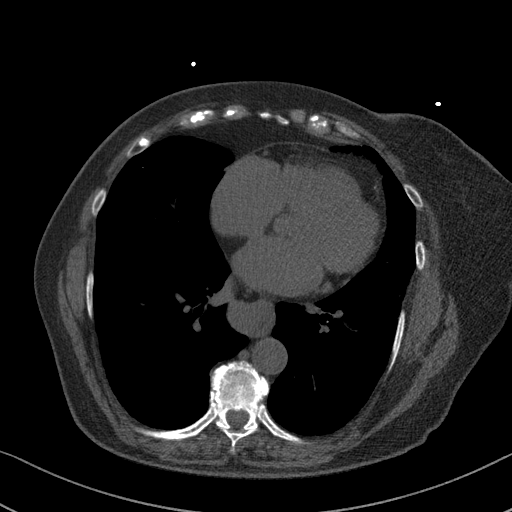
[im 45/68  vessel]
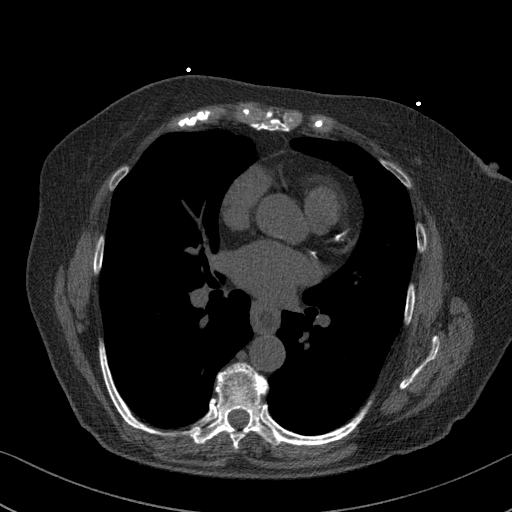
[im 56/68  vessel]
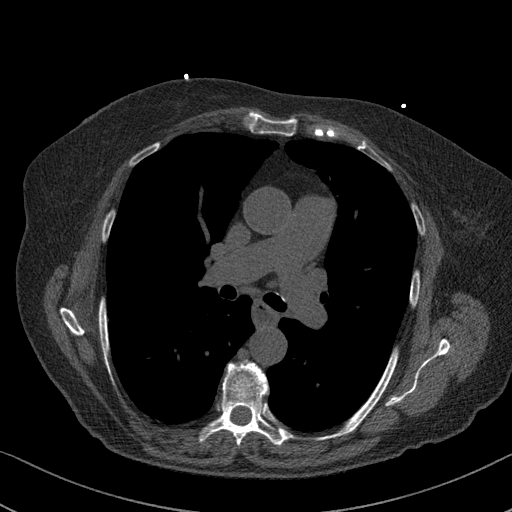
[im 56/68  lung]
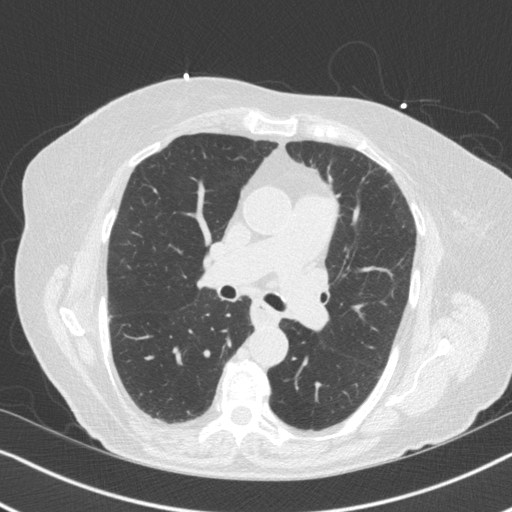

[Series 4: cascseq 2.0 br59 lung · axial · 0.69mm/px · z∈[-232,-144]mm · 5 of 68 slices shown]
[im 12/68  lung]
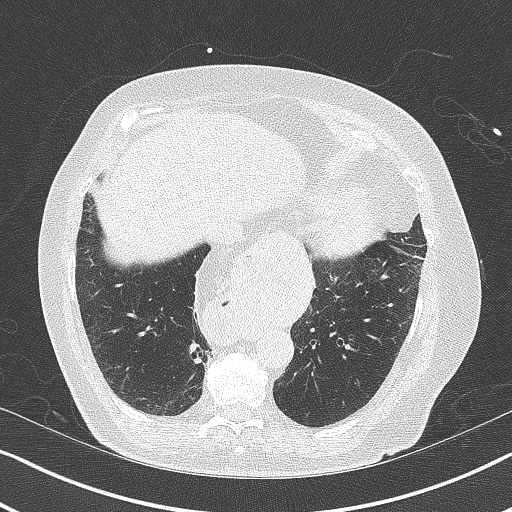
[im 23/68  lung]
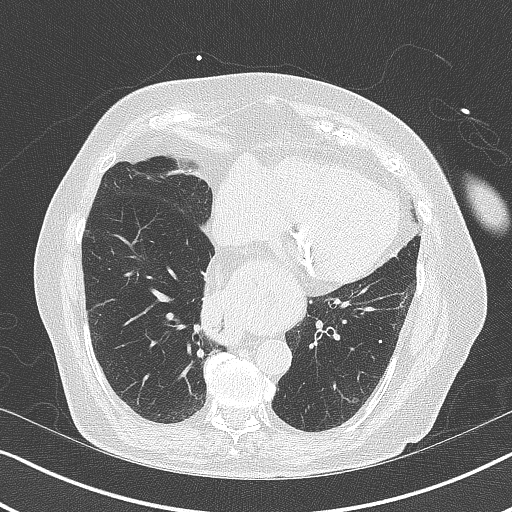
[im 34/68  lung]
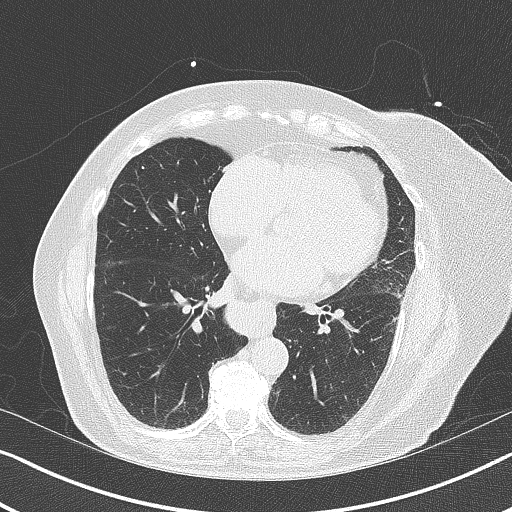
[im 45/68  lung]
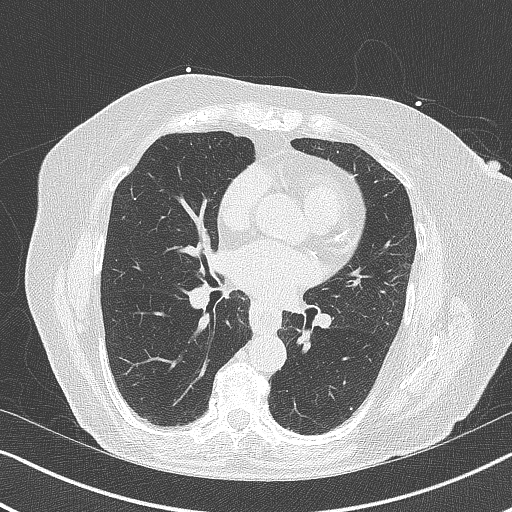
[im 56/68  lung]
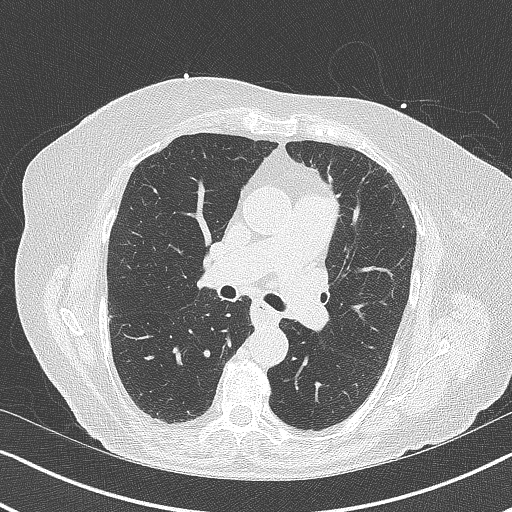

[14 of 20 positions shown; findings below may reference images not displayed]

FINDINGS: Vascular: Heart is normal size. Mitral valve and aortic
calcifications. No aneurysm.

Mediastinum/Nodes: No adenopathy.  Large hiatal hernia.

Lungs/Pleura: Scarring in the lung bases. No confluent opacities or
effusions.

Upper Abdomen: Scattered hypodensities in the liver, difficult to
characterize on this noncontrast study.

Musculoskeletal: Chest wall soft tissues are unremarkable. No acute
bony abnormality.
IMPRESSION: Scattered aortic atherosclerosis.  No aneurysm.

Large hiatal hernia.

Scattered hypodensities in the liver, favor cysts. These could be
further characterized with ultrasound.
FINDINGS: Coronary arteries: Normal origins.

Coronary Calcium Score:

Left main: 0

Left anterior descending artery: 355

Left circumflex artery: 107

Right coronary artery: 0

Total: 462

Percentile: 83rd

Mitral annular calcification noted.

Pericardium: Normal.

Ascending Aorta: Normal caliber.

Non-cardiac: See separate report from [REDACTED].
IMPRESSION: Coronary calcium score of 462 Agatston units. This was 83rd
percentile for age-, race-, and sex-matched controls.



If CAC=0, it is reasonable to withhold statin therapy and reassess
in 5 to 10 years, as long as higher risk conditions are absent
(diabetes mellitus, family history of premature CHD in first degree
relatives (males <55 years; females <65 years), cigarette smoking,
or LDL >=190 mg/dL).

If CAC is 1 to 99, it is reasonable to initiate statin therapy for
patients >=55 years of age.

If CAC is >=100 or >=75th percentile, it is reasonable to initiate
statin therapy at any age.

Cardiology referral should be considered for patients with CAC
scores >=400 or >=75th percentile.

*5024 AHA/ACC/AACVPR/AAPA/ABC/NAZARETH/METAYER/TIGER/[place]/EVGENY/ARNESON/J PANDO
Guideline on the Management of Blood Cholesterol: A Report of the
American College of Cardiology/American Heart Association Task Force
on Clinical Practice Guidelines. J Am Coll Cardiol.
8760;73(24):1161-1383.

*** End of Addendum ***
EXAM:
OVER-READ INTERPRETATION  CT CHEST

The following report is an over-read performed by radiologist Dr.
Km Willams [REDACTED] on 10/15/2021. This
over-read does not include interpretation of cardiac or coronary
anatomy or pathology. The coronary calcium score interpretation by
the cardiologist is attached.
FINDINGS: Vascular: Heart is normal size. Mitral valve and aortic
calcifications. No aneurysm.

Mediastinum/Nodes: No adenopathy.  Large hiatal hernia.

Lungs/Pleura: Scarring in the lung bases. No confluent opacities or
effusions.

Upper Abdomen: Scattered hypodensities in the liver, difficult to
characterize on this noncontrast study.

Musculoskeletal: Chest wall soft tissues are unremarkable. No acute
bony abnormality.
IMPRESSION: Scattered aortic atherosclerosis.  No aneurysm.

Large hiatal hernia.

Scattered hypodensities in the liver, favor cysts. These could be
further characterized with ultrasound.

## 2022-06-12 ENCOUNTER — Encounter: Payer: Self-pay | Admitting: Pulmonary Disease

## 2022-06-12 ENCOUNTER — Ambulatory Visit (INDEPENDENT_AMBULATORY_CARE_PROVIDER_SITE_OTHER): Payer: Medicare Other | Admitting: Pulmonary Disease

## 2022-06-12 VITALS — BP 136/62 | HR 88 | Temp 98.3°F | Ht 62.0 in | Wt 168.8 lb

## 2022-06-12 DIAGNOSIS — J849 Interstitial pulmonary disease, unspecified: Secondary | ICD-10-CM

## 2022-06-12 NOTE — Progress Notes (Signed)
Kelly Mooney    [id number]    1946-03-29  Primary Care Physician:Ross, Dwyane Luo, MD  Referring Physician: Lawerance Cruel, MD [place],  [place] [address]  Chief complaint: Follow up for interstitial lung disease, COPD  HPI: 76 year old with history of asthma, COPD, hiatal hernia with GERD, esophageal stricture.  Previously followed by Dr. Lamonte Sakai.  Has been referred for evaluation of high-resolution CT showing interstitial lung disease  Reports mild dyspnea on exertion, cough which is nonproductive.  Over the past few days she has increasing cough and congestion with wheezing.  She follows with Dr. Henrene Pastor for esophageal stricture and is on a PPI.  She continues to have difficulty swallowing and symptoms of heartburn.  History also notable for COVID-19 in November 2020 for which she was hospitalized. 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  No feather pillows or comforters ILD questionnaire 03/12/22-negative for exposure Smoking history: 10-pack-year smoker.  Quit in 1997 Travel history: No significant travel history Relevant family history: No family history of lung disease  Interim history: She recently had a trip to [facility] where she noted dyspnea on exertion on inclines Continues on breztri inhaler.  Outpatient Encounter Medications as of 06/12/2022  Medication Sig   albuterol (PROVENTIL HFA;VENTOLIN HFA) 108 (90 BASE) MCG/ACT inhaler Inhale 2 puffs into the lungs 4 (four) times daily.    albuterol (PROVENTIL) (2.5 MG/3ML) 0.083% nebulizer solution Take 2.5 mg by nebulization every 6 (six) hours as needed.   amLODipine (NORVASC) 10 MG  tablet Take 10 mg by mouth daily with lunch.    aspirin EC 81 MG tablet Take 1 tablet (81 mg total) by mouth daily. Swallow whole.   atorvastatin (LIPITOR) 20 MG tablet Take 1 tablet (20 mg total) by mouth daily.   azithromycin (ZITHROMAX) 250 MG tablet Take as directed   benzonatate (TESSALON) 200 MG capsule Take 200 mg by mouth 3 (three) times daily.   Budeson-Glycopyrrol-Formoterol (BREZTRI AEROSPHERE) 160-9-4.8 MCG/ACT AERO Inhale 2 puffs into the lungs in the morning and at bedtime.   chlorthalidone (HYGROTON) 25 MG tablet Take 25 mg by mouth daily.    clobetasol cream (TEMOVATE) 0.27 % Apply 1 application topically daily. Apply a thin layer to affected areas   fenofibrate 160 MG tablet Take 160 mg by mouth daily.   glipiZIDE (GLUCOTROL) 5 MG tablet Take 5 mg by mouth 2 (two) times daily.   hydrOXYzine (ATARAX/VISTARIL) 25 MG tablet Take 25 mg by mouth every 8 (eight) hours as needed.   irbesartan (AVAPRO) 300 MG tablet    levothyroxine (SYNTHROID, LEVOTHROID) 137 MCG tablet Take 137 mcg by mouth daily before breakfast.   mometasone (ELOCON) 0.1 % cream Apply 1 application topically every other day.    montelukast (SINGULAIR) 10 MG tablet Take 10 mg by mouth daily.    pantoprazole (PROTONIX) 40 MG tablet TAKE 1 TABLET (40 MG TOTAL) BY MOUTH DAILY.   pioglitazone (ACTOS) 15 MG tablet Take 15 mg by mouth daily.   predniSONE (DELTASONE) 20 MG tablet Take '40mg'$  for 5 days   Soft Lens Products (B & L SENSITIVE EYES SALINE) 0.4 % SOLN Place 1 drop into both  eyes daily as needed (Dry eye).   valACYclovir (VALTREX) 1000 MG tablet Take 2 tablets by mouth as needed (Cold Sore).    [DISCONTINUED] fluocinonide (LIDEX) 0.05 % external solution Apply 1 application topically See admin instructions. Apply to affected areas daily as needed for itching   [DISCONTINUED] Multiple Vitamins-Minerals (MULTIVITAMIN WITH MINERALS) tablet Take 2 tablets by mouth daily. Gummie   No facility-administered encounter  medications on file as of 06/12/2022.    Physical Exam: Blood pressure 136/62, pulse 88, temperature 98.3 F (36.8 C), temperature source Oral, height '5\' 2"'$  (1.575 m), weight 168 lb 12.8 oz (76.6 kg), SpO2 98 %. 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  PFTs: 04/21/2021 FVC 2.18 [84%], FEV1 1.35 [70%], F/F 62, TLC 5.64 [18%], DLCO 18.50 [103%] Moderate restriction with bronchodilator response.  Hyperinflation and air trapping.  Diffusion capacity is normal.  Labs: Autoimmune serologies 03/12/2022-negative  Assessment:  Evaluation for COPD CT shows mild scarring at the base and probable UIP pattern.  This could be from her prior COVID infection.  Other possibilities are ongoing aspiration due to esophageal stricture and hiatal hernia, connective tissue disease as she has a strong family history of rheumatoid arthritis and joint symptoms.  ILD serologies are negative Repeat high-res CT and PFTs in 3  COPD, asthma overlap Continues on breztri inhaler.    Hiatal hernia, esophageal stricture, GERD Continue PPI.  Follows with Dr. Henrene Pastor  Plan/Recommendations: Follow-up high-res CT, PFTs Continue inhalers and PPI  Marshell Garfinkel MD [facility] 06/12/2022, 2:26 PM  CC: Lawerance Cruel, MD

## 2022-06-12 NOTE — Patient Instructions (Signed)
Continue the inhalers as prescribed We will schedule high-res CT and PFTs in 3 months and follow-up in clinic after these tests.

## 2022-08-10 ENCOUNTER — Telehealth: Payer: Self-pay | Admitting: Pulmonary Disease

## 2022-08-10 MED ORDER — PREDNISONE 20 MG PO TABS: 40.0000 mg | ORAL_TABLET | Freq: Every day | ORAL | 0 refills | Status: DC

## 2022-08-10 MED ORDER — AZITHROMYCIN 250 MG PO TABS: 250.0000 mg | ORAL_TABLET | ORAL | 0 refills | Status: DC

## 2022-08-10 NOTE — Telephone Encounter (Signed)
Patient returning call please call back

## 2022-08-10 NOTE — Telephone Encounter (Signed)
Spoke with the pt  She is c/o increased SOB, wheezing, chest tightness, and prod cough with brown sputum x 5 days  Denies any fevers, aches, chills She took a covid test at home which was neg  She is using breztri bid, tessalon pearles, singulair, albuterol inhaler/nebs  She does get minimal relief with albuterol  She asks from pred taper and abx  No appts in clinic until later in the wk  Please advise, thanks!  Allergies  Allergen Reactions   Chloramphenicol Anaphylaxis   Ivp Dye [Iodinated Contrast Media] Anaphylaxis, Shortness Of Breath and Other (See Comments)    SEVERE convulsions, too    Crestor [Rosuvastatin Calcium]     Muscle cramps   Iodine Itching   Sulfa Antibiotics Rash

## 2022-08-10 NOTE — Telephone Encounter (Signed)
Please call in z pack and prednisone '40mg'$ /day for 5 days

## 2022-08-10 NOTE — Telephone Encounter (Signed)
Spoke with the pt and notified of response per Dr Vaughan Browner  She verbalized understanding  Rxs sent to [facility]

## 2022-08-10 NOTE — Telephone Encounter (Signed)
Lm x1 for patient.  

## 2022-08-10 NOTE — Telephone Encounter (Signed)
Patient requesting antibiotics and prednisone. Patient is having chest congestion, yellow/ green mucus, and difficultly breathing which started Thursday. Patient has taken Theraflu which did not really help. Uses [facility].  Please call back at [phone].

## 2022-09-01 ENCOUNTER — Ambulatory Visit
Admission: RE | Admit: 2022-09-01 | Discharge: 2022-09-01 | Disposition: A | Discharge status: Home / Self Care | Payer: Medicare Other | Source: Ambulatory Visit | Attending: Pulmonary Disease | Admitting: Pulmonary Disease

## 2022-09-01 DIAGNOSIS — J849 Interstitial pulmonary disease, unspecified: Secondary | ICD-10-CM

## 2022-09-07 ENCOUNTER — Ambulatory Visit (INDEPENDENT_AMBULATORY_CARE_PROVIDER_SITE_OTHER): Payer: Medicare Other | Admitting: Pulmonary Disease

## 2022-09-07 ENCOUNTER — Encounter: Payer: Self-pay | Admitting: Pulmonary Disease

## 2022-09-07 VITALS — BP 136/60 | HR 74 | Temp 98.1°F | Ht 62.0 in | Wt 174.0 lb

## 2022-09-07 DIAGNOSIS — Z23 Encounter for immunization: Secondary | ICD-10-CM

## 2022-09-07 DIAGNOSIS — J439 Emphysema, unspecified: Secondary | ICD-10-CM | POA: Diagnosis not present

## 2022-09-07 DIAGNOSIS — J4489 Other specified chronic obstructive pulmonary disease: Secondary | ICD-10-CM

## 2022-09-07 DIAGNOSIS — J849 Interstitial pulmonary disease, unspecified: Secondary | ICD-10-CM | POA: Diagnosis not present

## 2022-09-07 LAB — PULMONARY FUNCTION TEST
DL/VA % pred: 95 %
DL/VA: 3.95 ml/min/mmHg/L
DLCO cor % pred: 81 %
DLCO cor: 14.48 ml/min/mmHg
DLCO unc % pred: 81 %
DLCO unc: 14.48 ml/min/mmHg
FEF 25-75 Post: 0.9 L/sec
FEF 25-75 Pre: 0.57 L/sec
FEF2575-%Change-Post: 57 %
FEF2575-%Pred-Post: 62 %
FEF2575-%Pred-Pre: 39 %
FEV1-%Change-Post: 14 %
FEV1-%Pred-Post: 64 %
FEV1-%Pred-Pre: 55 %
FEV1-Post: 1.19 L
FEV1-Pre: 1.04 L
FEV1FVC-%Change-Post: -1 %
FEV1FVC-%Pred-Pre: 84 %
FEV6-%Change-Post: 15 %
FEV6-%Pred-Post: 80 %
FEV6-%Pred-Pre: 69 %
FEV6-Post: 1.91 L
FEV6-Pre: 1.65 L
FEV6FVC-%Change-Post: 0 %
FEV6FVC-%Pred-Post: 105 %
FEV6FVC-%Pred-Pre: 105 %
FVC-%Change-Post: 16 %
FVC-%Pred-Post: 76 %
FVC-%Pred-Pre: 66 %
FVC-Post: 1.92 L
FVC-Pre: 1.65 L
Post FEV1/FVC ratio: 62 %
Post FEV6/FVC ratio: 99 %
Pre FEV1/FVC ratio: 63 %
Pre FEV6/FVC Ratio: 100 %
RV % pred: 103 %
RV: 2.32 L
TLC % pred: 89 %
TLC: 4.28 L

## 2022-09-07 MED ORDER — RSVPREF3 VAC RECOMB ADJUVANTED 120 MCG/0.5ML IM SUSR: 0.5000 mL | Freq: Once | INTRAMUSCULAR | 0 refills | Status: AC

## 2022-09-07 NOTE — Progress Notes (Signed)
Full PFT performed today. °

## 2022-09-07 NOTE — Progress Notes (Signed)
Kelly Mooney    [id number]    [birth year]  Primary Care Physician:Ross, Dwyane Luo, MD  Referring Physician: Lawerance Cruel, MD [place],  [place] [id number]  Chief complaint: Follow up for interstitial lung disease, COPD  HPI: 76 year old with history of asthma, COPD, hiatal hernia with GERD, esophageal stricture.  Previously followed by Dr. Lamonte Sakai.  Has been referred for evaluation of high-resolution CT showing interstitial lung disease  Reports mild dyspnea on exertion, cough which is nonproductive.  Over the past few days she has increasing cough and congestion with wheezing.  She follows with Dr. Henrene Pastor for esophageal stricture and is on a PPI.  She continues to have difficulty swallowing and symptoms of heartburn.  History also notable for COVID-19 in November 2020 for which she was hospitalized. 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  No feather pillows or comforters ILD questionnaire 03/12/22-negative for exposure Smoking history: 10-pack-year smoker.  Quit in 1997 Travel history: No significant travel history Relevant family history: No family history of lung disease  Interim history: Continues on  inhaler.  She is here for review of CT and PFTs. States that breathing is stable with no change since last visit.  Outpatient Encounter Medications as of 09/07/2022  Medication Sig   albuterol (PROVENTIL HFA;VENTOLIN HFA) 108 (90 BASE) MCG/ACT inhaler Inhale 2 puffs into the lungs 4 (four) times daily.    albuterol (PROVENTIL) (2.5 MG/3ML) 0.083% nebulizer solution Take 2.5 mg by nebulization every 6 (six) hours as needed.   amLODipine (NORVASC)  10 MG tablet Take 10 mg by mouth daily with lunch.    aspirin EC 81 MG tablet Take 1 tablet (81 mg total) by mouth daily. Swallow whole.   atorvastatin (LIPITOR) 20 MG tablet Take 1 tablet (20 mg total) by mouth daily.   benzonatate (TESSALON) 200 MG capsule Take 200 mg by mouth 3 (three) times daily.   Budeson-Glycopyrrol-Formoterol (BREZTRI AEROSPHERE) 160-9-4.8 MCG/ACT AERO Inhale 2 puffs into the lungs in the morning and at bedtime.   chlorthalidone (HYGROTON) 25 MG tablet Take 25 mg by mouth daily.    clobetasol cream (TEMOVATE) 2.45 % Apply 1 application topically daily. Apply a thin layer to affected areas   fenofibrate 160 MG tablet Take 160 mg by mouth daily.   glipiZIDE (GLUCOTROL) 5 MG tablet Take 5 mg by mouth 2 (two) times daily.   hydrOXYzine (ATARAX/VISTARIL) 25 MG tablet Take 25 mg by mouth every 8 (eight) hours as needed.   irbesartan (AVAPRO) 300 MG tablet    levothyroxine (SYNTHROID, LEVOTHROID) 137 MCG tablet Take 137 mcg by mouth daily before breakfast.   mometasone (ELOCON) 0.1 % cream Apply 1 application topically every other day.    montelukast (SINGULAIR) 10 MG tablet Take 10 mg by mouth daily.    pantoprazole (PROTONIX) 40 MG tablet TAKE 1 TABLET (40 MG TOTAL) BY MOUTH DAILY.   pioglitazone (ACTOS) 15 MG tablet Take 15 mg by mouth daily.   Soft Lens Products (B & L SENSITIVE EYES SALINE) 0.4 % SOLN Place 1 drop into both eyes daily as needed (Dry eye).   valACYclovir (VALTREX) 1000 MG tablet Take 2 tablets by  mouth as needed (Cold Sore).    [DISCONTINUED] azithromycin (ZITHROMAX) 250 MG tablet Take 1 tablet (250 mg total) by mouth as directed.   [DISCONTINUED] predniSONE (DELTASONE) 20 MG tablet Take 2 tablets (40 mg total) by mouth daily with breakfast.   No facility-administered encounter medications on file as of 09/07/2022.    Physical Exam: Blood pressure 136/60, pulse 74, temperature 98.1 F (36.7 C), temperature source Oral, height '5\' 2"'$  (1.575 m), weight  174 lb (78.9 kg), SpO2 98 %. 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  High-resolution CT chest 09/01/2022-limited by respiratory motion.  Continues to have mild, subtle changes of groundglass attenuation and septal thickening. I have reviewed the images personally   PFTs: 04/21/2021 FVC 2.18 [84%], FEV1 1.35 [70%], F/F 62, TLC 5.64 [118%], DLCO 18.50 [103%]  09/07/2022 FVC 1.92 [76%], FEV1 1.19 [64%], F/F 62, TLC 4.28 [89%], DLCO 14.48 [81%] Moderate obstruction with bronchodilator response, air trapping.    Labs: Autoimmune serologies 03/12/2022-negative  Assessment:  Evaluation for interstitial lung disease CT shows mild scarring at the base and probable UIP pattern.  This could be from her prior COVID infection.  Other possibilities are ongoing aspiration due to esophageal stricture and hiatal hernia, connective tissue disease as she has a strong family history of rheumatoid arthritis and joint symptoms.  ILD serologies are negative We have reviewed the follow-up CT which is stable.  There is reduction in lung volume and diffusion capacity but still remains in the normal range.  After discussion in detail in office today we have decided to manage conservatively and monitor.  COPD, asthma overlap Continues on breztri inhaler.    Hiatal hernia, esophageal  stricture, GERD Continue PPI.  Follows with Dr. Henrene Pastor  Plan/Recommendations: Continue inhalers and PPI Follow-up in 6 months.  Marshell Garfinkel MD [facility] 09/07/2022, 3:17 PM  CC: Lawerance Cruel, MD

## 2022-09-07 NOTE — Patient Instructions (Signed)
I am glad you are stable with your breathing Your CT shows stable changes as to your PFTs Continue the inhaler for COPD Follow-up in 6 months

## 2022-09-07 NOTE — Patient Instructions (Signed)
Full PFT performed today. °

## 2022-10-14 IMAGING — US US ABDOMEN LIMITED
1 series · 14 of 25 positions shown · non-contrast
Comparison: Chest CT of 02/11/2022

CLINICAL DATA: Indeterminate liver lesions on CT

EXAM:
ULTRASOUND ABDOMEN LIMITED RIGHT UPPER QUADRANT

[Series 1: us abdomen limited · 0.28mm/px · 14 of 52 slices shown]
[im 1/52]
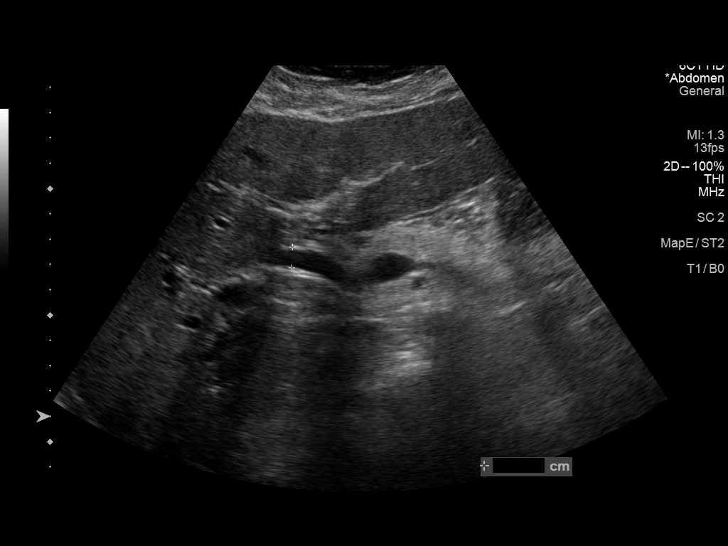
[im 5/52]
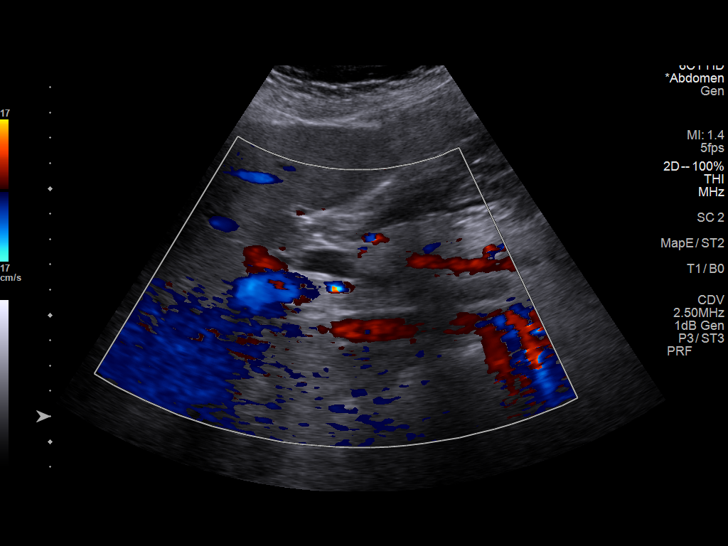
[im 9/52]
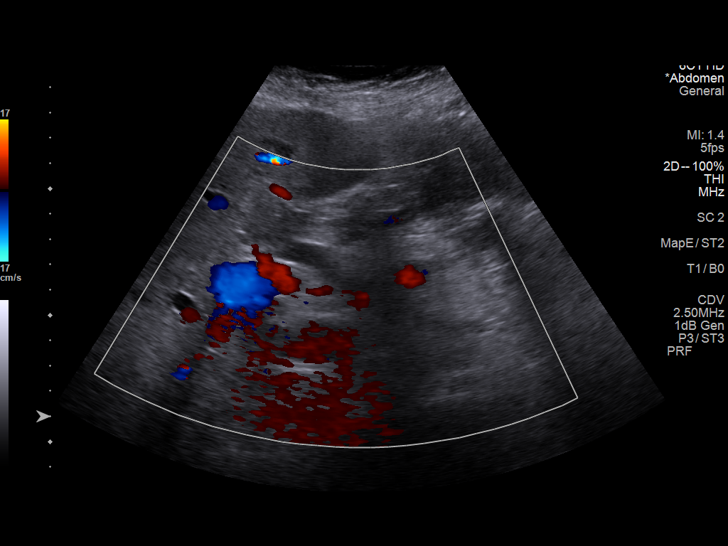
[im 13/52]
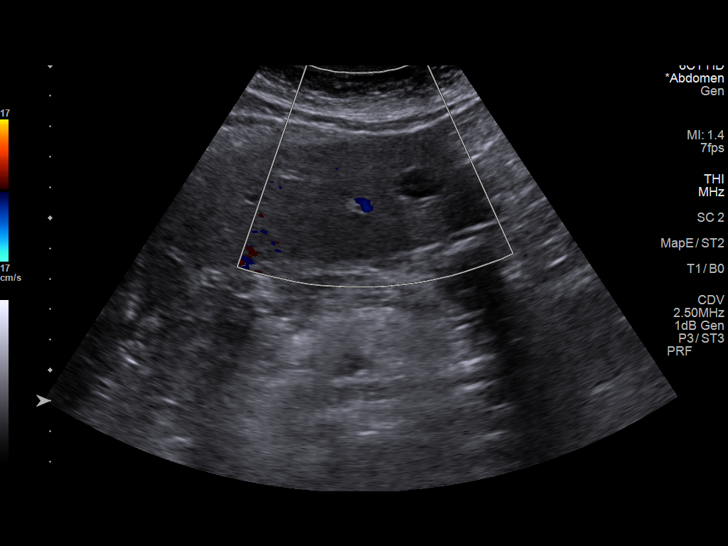
[im 18/52]
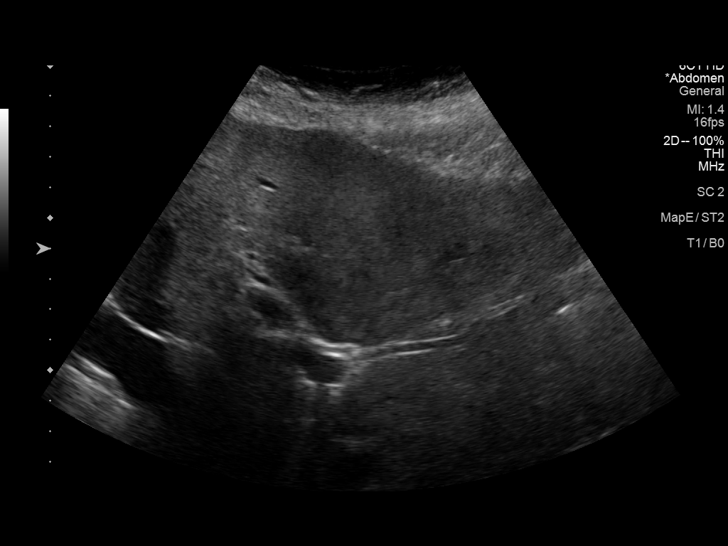
[im 20/52]
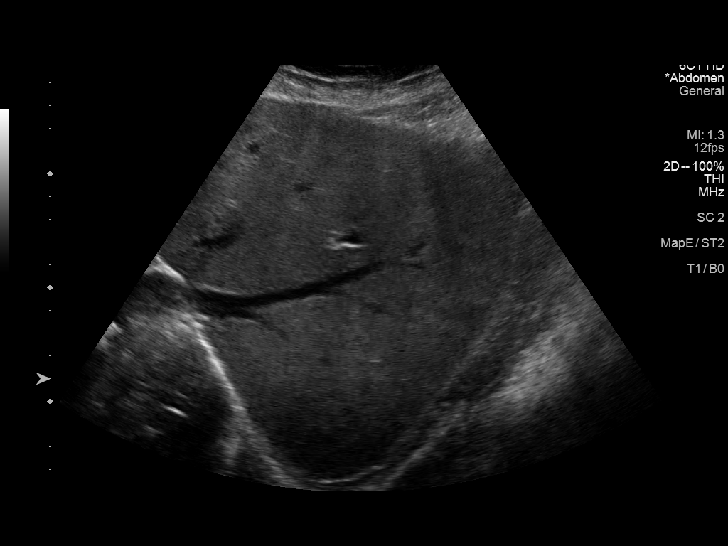
[im 24/52]
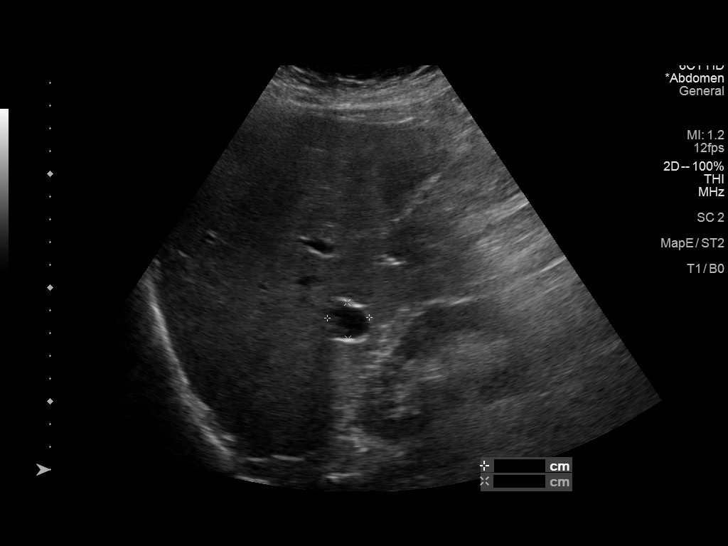
[im 28/52]
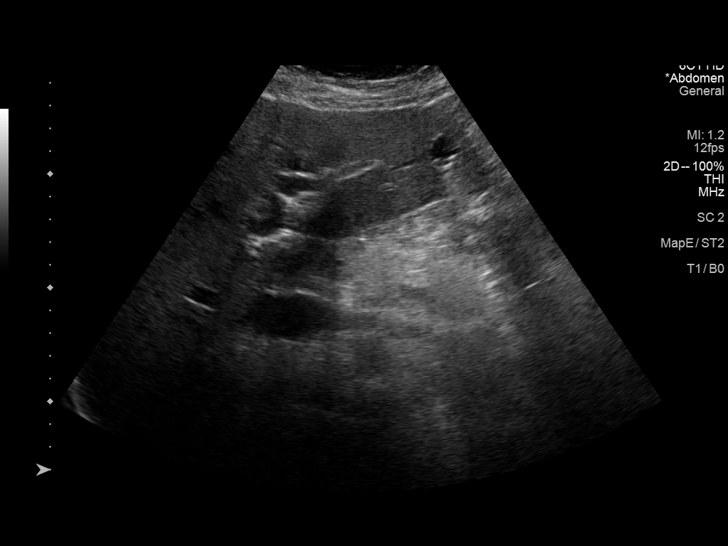
[im 32/52]
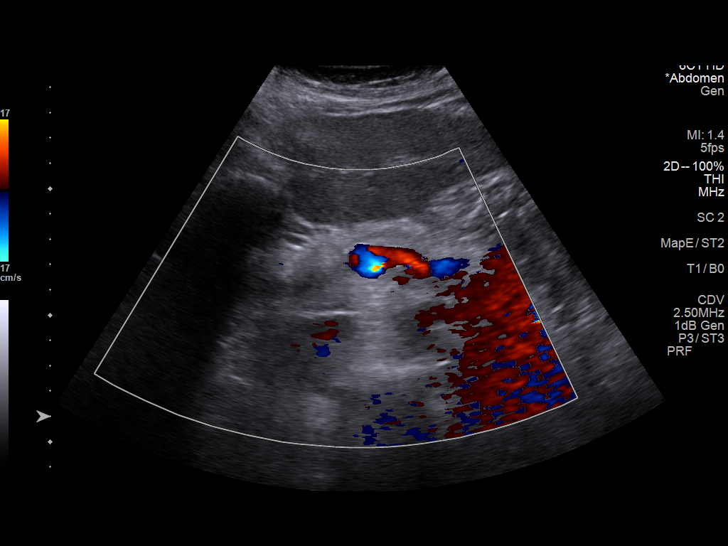
[im 35/52]
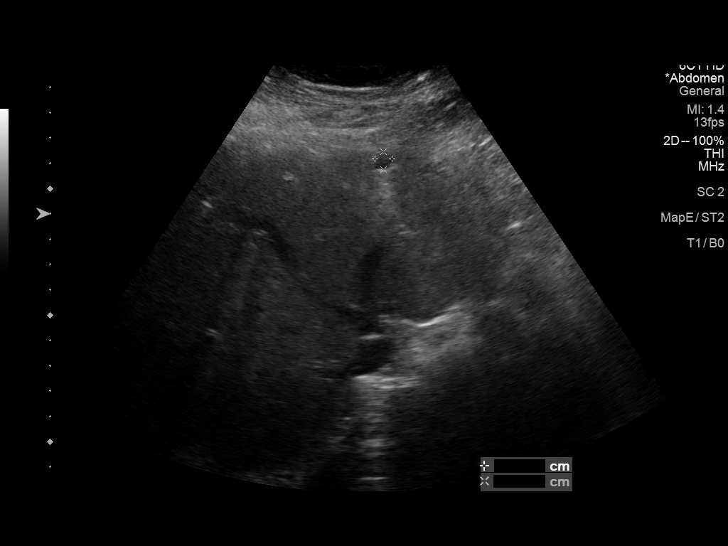
[im 39/52]
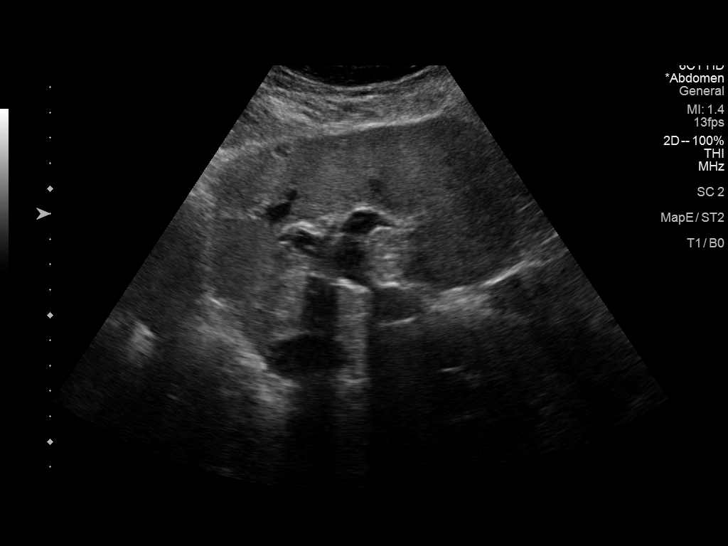
[im 43/52]
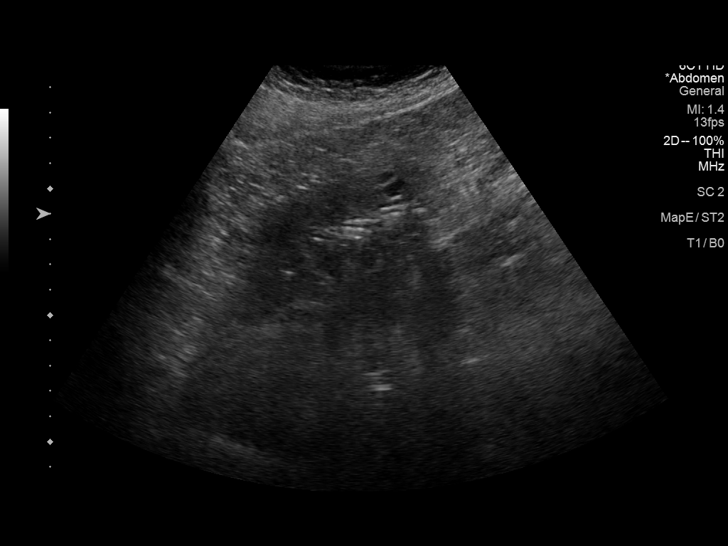
[im 47/52]
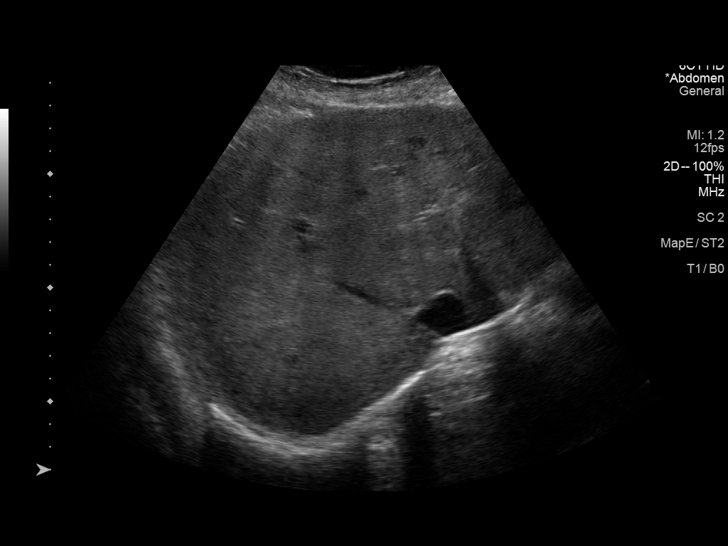
[im 52/52]
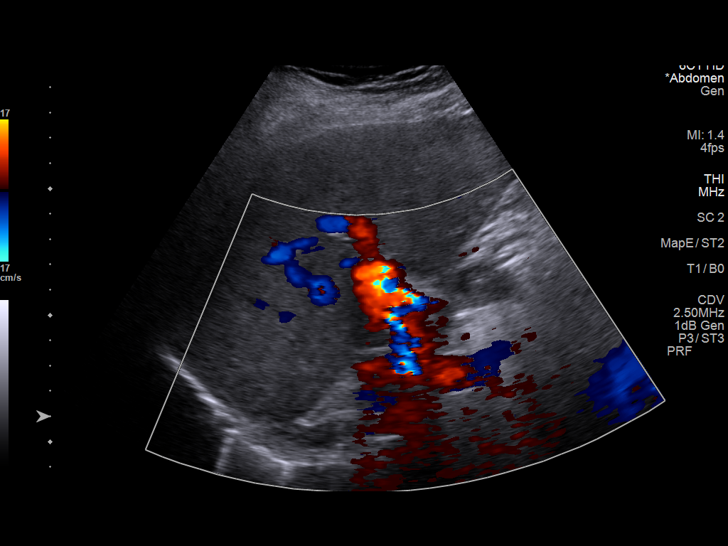

[14 of 25 positions shown; findings below may reference images not displayed]

FINDINGS: Gallbladder:

Surgically absent

Common bile duct:

Diameter: Upper normal, maximally 11 mm.

Liver:

Heterogeneous hepatic echotexture with suggestion of irregular
hepatic capsule. Dominant right liver lobe 2.3 cm lesion is favored
to represent a cyst or minimally complex cyst.

A central left liver lobe 11 mm lesion is also favored to represent
a cyst or minimally complex cyst.

Other liver lesions are too small to characterize, including on the
order of 7 mm in the left hepatic lobe. Portal vein is patent on
color Doppler imaging with normal direction of blood flow towards
the liver.

Other: No ascites.
IMPRESSION: Heterogeneous hepatic echotexture with subtle irregular hepatic
capsule. Findings suspicious for cirrhosis.

The larger liver lesions are favored to represent cysts and complex
cysts. Other lesions are too small to characterize. Given suspicion
of cirrhosis, consider further evaluation with pre and post contrast
abdominal MRI.

## 2022-10-27 ENCOUNTER — Encounter (HOSPITAL_BASED_OUTPATIENT_CLINIC_OR_DEPARTMENT_OTHER): Payer: Self-pay | Admitting: Cardiology

## 2022-10-27 ENCOUNTER — Encounter: Payer: Self-pay | Admitting: Pulmonary Disease

## 2022-10-27 DIAGNOSIS — E7800 Pure hypercholesterolemia, unspecified: Secondary | ICD-10-CM

## 2022-10-27 MED ORDER — AZITHROMYCIN 250 MG PO TABS: ORAL_TABLET | ORAL | 0 refills | Status: DC

## 2022-10-27 MED ORDER — PREDNISONE 10 MG PO TABS: ORAL_TABLET | ORAL | 0 refills | Status: DC

## 2022-10-27 NOTE — Addendum Note (Signed)
Addended by: Collier Salina on: 10/27/2022 01:21 PM   Modules accepted: Orders

## 2022-10-27 NOTE — Telephone Encounter (Signed)
Dr. Melvyn Novas, please advise as Dr. Vaughan Browner is on night shift. Please see pt's message (below). Thanks.     Dr. Vaughan Browner, Please order antibiotic and prednisone medication for at least 5 days to [place] in [facility].   I am experiencing tightness in my chest and coughing up yellowish/greenish gunk for last 3 days.  I took COVID test which is negative.   I get out of breath just walking around in the house, any attempted activities will only get worse without these medications.    Thank  you for your prompt attention. Jendayi Berling [phone]  10/27/2022

## 2022-10-27 NOTE — Telephone Encounter (Signed)
Zpak Prednisone 10 mg take  4 each am x 2 days,   2 each am x 2 days,  1 each am x 2 days and stop  Call by Friday 12/16 if not doing some better

## 2022-11-02 NOTE — Addendum Note (Signed)
Addended by: Vergia Alcon A on: 11/02/2022 02:22 PM   Modules accepted: Orders

## 2022-11-03 ENCOUNTER — Ambulatory Visit: Payer: Medicare Other | Attending: Cardiology | Admitting: Cardiology

## 2022-11-03 ENCOUNTER — Encounter: Payer: Self-pay | Admitting: Cardiology

## 2022-11-03 ENCOUNTER — Ambulatory Visit: Payer: Medicare Other | Admitting: Cardiology

## 2022-11-03 ENCOUNTER — Ambulatory Visit: Payer: Medicare Other

## 2022-11-03 VITALS — BP 130/62 | HR 78 | Ht 61.0 in | Wt 176.0 lb

## 2022-11-03 DIAGNOSIS — E7800 Pure hypercholesterolemia, unspecified: Secondary | ICD-10-CM | POA: Diagnosis not present

## 2022-11-03 DIAGNOSIS — R931 Abnormal findings on diagnostic imaging of heart and coronary circulation: Secondary | ICD-10-CM | POA: Diagnosis not present

## 2022-11-03 DIAGNOSIS — I6523 Occlusion and stenosis of bilateral carotid arteries: Secondary | ICD-10-CM

## 2022-11-03 DIAGNOSIS — I10 Essential (primary) hypertension: Secondary | ICD-10-CM | POA: Diagnosis not present

## 2022-11-03 DIAGNOSIS — R0609 Other forms of dyspnea: Secondary | ICD-10-CM | POA: Diagnosis not present

## 2022-11-03 DIAGNOSIS — R600 Localized edema: Secondary | ICD-10-CM

## 2022-11-03 LAB — LIPID PANEL
Chol/HDL Ratio: 2.6 ratio (ref 0.0–4.4)
Cholesterol, Total: 167 mg/dL (ref 100–199)
HDL: 64 mg/dL (ref >39)
LDL Chol Calc (NIH): 76 mg/dL (ref 0–99)
Triglycerides: 158 mg/dL — ABNORMAL HIGH (ref 0–149)
VLDL Cholesterol Cal: 27 mg/dL (ref 5–40)

## 2022-11-03 LAB — ALT: ALT: 17 IU/L (ref 0–32)

## 2022-11-03 NOTE — Progress Notes (Signed)
Cardiology CONSULT Note    Date:  11/03/2022   ID:  [name], DOB 02/04/1946, MRN [id number]  PCP:  Kelly Cruel, MD  Cardiologist:  Kelly Him, MD   Chief Complaint  Patient presents with   Follow-up    Coronary artery calcifications, HTN, chronic DOE, HLD, Carotid artery stenosis    History of Present Illness:  Kelly Mooney is a 76 y.o. female  with a history of arthritis, asthma, COPD, diabetes mellitus 2, GERD, hypertension and hyperlipidemia.  2D echocardiogram done 07/06/2018 showing normal LV size and systolic function with grade 1 diastolic dysfunction, calcified mitral valve annulus. She has chronic SOB related to her COPD and has been in Pulmonary Rehab in the past.  Her father died at 23 of an MI and her brother died at 20 of an MI and another brother at 59 of MI and another brother with CAD and PCI. Lexiscan myoview 2022 showed no ischemia.  Coronary Ca score was elevated at 462.    She is here today for followup and is doing well.  She denies any chest pain or pressure, PND, orthopnea, dizziness, palpitations or syncope. She has chronic DOE and LE edema  that she thinks is stable.  She is compliant with her meds and is tolerating meds with no SE.    Past Medical History:  Diagnosis Date   Agatston coronary artery calcium score greater than 400    462 on coronary calcium score 09/2021   Arthritis    Asthma    since age 89   Carotid artery stenosis    1-39% bilateral carotid stenosis   Cataract    Colon polyps    Complication of anesthesia    slow to wake up    COPD (chronic obstructive pulmonary disease) ([place])    COVID-19 09/2019   Diabetes mellitus    Diverticulosis    Dyspnea    with exertoin due to asthma    Esophageal stricture    Family history of adverse reaction to anesthesia    mother , daughter and son slow to wake up and  problems with N/V    GERD (gastroesophageal reflux disease)    Hemorrhoids    Hiatal hernia    History of  kidney stones    Hypercholesteremia    Hypertension    Hypothyroidism    IBS (irritable bowel syndrome)    Paraesophageal hernia    PONV (postoperative nausea and vomiting)    Stroke ([place])    no deficits    Tubular adenoma of colon     Past Surgical History:  Procedure Laterality Date   ABDOMINAL HYSTERECTOMY     BUNIONECTOMY Right 10/2012   CATARACT EXTRACTION, BILATERAL     CHOLECYSTECTOMY  1980   ESOPHAGEAL MANOMETRY N/A 07/15/2015   Procedure: ESOPHAGEAL MANOMETRY (EM);  Surgeon: Kelly Shipper, MD;  Location: [facility];  Service: Endoscopy;  Laterality: N/A;   HEMORRHOID SURGERY N/A 08/01/2020   Procedure: HEMORRHOIDECTOMY, HEMORRHOIDAL LIGATION Pollyann Savoy ANORECTAL EXAMINATION UNDER ANESTHESIA;  Surgeon: Kelly Boston, MD;  Location: [facility];  Service: General;  Laterality: N/A;  LOCAL   PARTIAL HYSTERECTOMY     RHINOPLASTY     nasal polyps removed dr. Barrie Mooney    Current Medications: Current Meds  Medication Sig   albuterol (PROVENTIL HFA;VENTOLIN HFA) 108 (90 BASE) MCG/ACT inhaler Inhale 2 puffs into the lungs 4 (four) times daily.    albuterol (PROVENTIL) (2.5 MG/3ML) 0.083% nebulizer solution Take 2.5 mg by nebulization  every 6 (six) hours as needed.   amLODipine (NORVASC) 10 MG tablet Take 10 mg by mouth daily with lunch.    aspirin EC 81 MG tablet Take 1 tablet (81 mg total) by mouth daily. Swallow whole.   atorvastatin (LIPITOR) 20 MG tablet Take 1 tablet (20 mg total) by mouth daily.   azithromycin (ZITHROMAX) 250 MG tablet Take as directed   benzonatate (TESSALON) 200 MG capsule Take 200 mg by mouth 3 (three) times daily.   Budeson-Glycopyrrol-Formoterol (BREZTRI AEROSPHERE) 160-9-4.8 MCG/ACT AERO Inhale 2 puffs into the lungs in the morning and at bedtime.   chlorthalidone (HYGROTON) 25 MG tablet Take 25 mg by mouth daily.    clobetasol cream (TEMOVATE) 9.67 % Apply 1 application topically daily. Apply a thin layer to affected areas   fenofibrate 160 MG tablet Take 160  mg by mouth daily.   glipiZIDE (GLUCOTROL) 5 MG tablet Take 5 mg by mouth 2 (two) times daily.   hydrOXYzine (ATARAX/VISTARIL) 25 MG tablet Take 25 mg by mouth every 8 (eight) hours as needed.   irbesartan (AVAPRO) 300 MG tablet    levothyroxine (SYNTHROID, LEVOTHROID) 137 MCG tablet Take 137 mcg by mouth daily before breakfast.   mometasone (ELOCON) 0.1 % cream Apply 1 application topically every other day.    montelukast (SINGULAIR) 10 MG tablet Take 10 mg by mouth daily.    pantoprazole (PROTONIX) 40 MG tablet TAKE 1 TABLET (40 MG TOTAL) BY MOUTH DAILY.   pioglitazone (ACTOS) 15 MG tablet Take 15 mg by mouth daily.   predniSONE (DELTASONE) 10 MG tablet Take 4 tabs x 2 days, then 2 tabs x 2 days, then 1 tab x 2 days, then stop.   Soft Lens Products (B & L SENSITIVE EYES SALINE) 0.4 % SOLN Place 1 drop into both eyes daily as needed (Dry eye).   valACYclovir (VALTREX) 1000 MG tablet Take 2 tablets by mouth as needed (Cold Sore).     Allergies:   Chloramphenicol, Ivp dye [iodinated contrast media], Crestor [rosuvastatin calcium], Iodine, and Sulfa antibiotics   Social History   Socioeconomic History   Marital status: Widowed    Spouse name: Not on file   Number of children: 2   Years of education: 30   Highest education level: Not on file  Occupational History   Occupation: Retired  Tobacco Use   Smoking status: Former    Packs/day: 1.50    Years: 10.00    Total pack years: 15.00    Types: Cigarettes    Quit date: 10/02/1996    Years since quitting: 26.1   Smokeless tobacco: Never   Tobacco comments:    quit 1997  Vaping Use   Vaping Use: Never used  Substance and Sexual Activity   Alcohol use: No    Alcohol/week: 0.0 standard drinks of alcohol   Drug use: No   Sexual activity: Not on file  Other Topics Concern   Not on file  Social History Narrative   Not on file   Social Determinants of Health   Financial Resource Strain: Not on file  Food Insecurity: Not on file   Transportation Needs: Not on file  Physical Activity: Not on file  Stress: Not on file  Social Connections: Not on file     Family History:  The patient's family history includes Asthma in her brother and mother; Diabetes Mellitus I in her mother; Heart attack in her brother, brother, and father; Hyperlipidemia in her mother.   ROS:  Please see the history of present illness.    ROS All other systems reviewed and are negative.      No data to display          PHYSICAL EXAM:   VS:  BP 130/62   Pulse 78   Ht '5\' 1"'$  (1.549 m)   Wt 176 lb (79.8 kg)   SpO2 99%   BMI 33.25 kg/m    GEN: Well nourished, well developed in no acute distress HEENT: Normal NECK: No JVD; No carotid bruits LYMPHATICS: No lymphadenopathy CARDIAC:RRR, no murmurs, rubs, gallops RESPIRATORY:  Clear to auscultation without rales, wheezing or rhonchi  ABDOMEN: Soft, non-tender, non-distended MUSCULOSKELETAL:  1+ pitting LE edema; No deformity  SKIN: Warm and dry NEUROLOGIC:  Alert and oriented x 3 PSYCHIATRIC:  Normal affect    Wt Readings from Last 3 Encounters:  11/03/22 176 lb (79.8 kg)  09/07/22 174 lb (78.9 kg)  06/12/22 168 lb 12.8 oz (76.6 kg)    Studies/Labs Reviewed:   EKG:  EKG is ordered today and demonstrates NSR with rSR in V1 c/w RV conduction delay  Recent Labs: No results found for requested labs within last 365 days.   Lipid Panel    Component Value Date/Time   CHOL 131 10/21/2021 1159   TRIG 127 10/21/2021 1159   HDL 52 10/21/2021 1159   CHOLHDL 2.5 10/21/2021 1159   CHOLHDL 4.0 07/06/2018 0505   VLDL 53 (H) 07/06/2018 0505   LDLCALC 57 10/21/2021 1159    Additional studies/ records that were reviewed today include:  Coronary Ca score, Lexiscan myoview    ASSESSMENT:    1. Agatston coronary artery calcium score greater than 400   2. DOE (dyspnea on exertion)   3. Essential hypertension   4. Pure hypercholesterolemia   5. Bilateral carotid artery stenosis    6. Leg edema      PLAN:  In order of problems listed above:  Coronary artery calcifications -coronary Ca score 2022 was 462 -Lexiscan myoview 2022 showed no ischemia -she denies any exertional CP -she has chronic SOB which is stable -continue ASA '81mg'$  daily and statin.  2.  Hypertension -BP controlled on exam today -continue prescription drug management with Amlodpine '10mg'$  daily, Chlorthalidone '25mg'$  daily, Irbesartan '300mg'$  daily with PRN refills -I have personally reviewed and interpreted outside labs performed by patient's PCP which showed SCr 0.93 and K+ 4.3 on 01/2022  3.  CHronic DOE/COPD -suspect this is related to COPD>she has no CP -Lexiscan myoview with no ischemia -repeat echo to assess for PHTN  4.  HLD -LDL goal < 70 due to coronary Ca -check FLp and ALT -continue prescription drug management with ATorvastatin '10mg'$  daily with PRN refills  5.  Bilateral Carotid artery stenosis -dopplers 6/23 with 1-39% bilateral stenosis -continue ASA and statin -repeat dopplers 04/2023  6.  LE edema -I recommended that she cut out Na >> she salts her food at home   Time Spent: 20 minutes total time of encounter, including 15 minutes spent in face-to-face patient care on the date of this encounter. This time includes coordination of care and counseling regarding above mentioned problem list. Remainder of non-face-to-face time involved reviewing chart documents/testing relevant to the patient encounter and documentation in the medical record. I have independently reviewed documentation from referring provider  Medication Adjustments/Labs and Tests Ordered: Current medicines are reviewed at length with the patient today.  Concerns regarding medicines are outlined above.  Medication changes, Labs and Tests ordered today  are listed in the Patient Instructions below.  There are no Patient Instructions on file for this visit.   Signed, Kelly Him, MD  11/03/2022 9:16 AM    [facility] [address], [place], [place]  [address] Phone: [phone]; Fax: [phone]

## 2022-11-03 NOTE — Addendum Note (Signed)
Addended by: Thompson Grayer on: 11/03/2022 09:22 AM   Modules accepted: Orders

## 2022-11-03 NOTE — Patient Instructions (Signed)
Medication Instructions:  Your physician recommends that you continue on your current medications as directed. Please refer to the Current Medication list given to you today.  *If you need a refill on your cardiac medications before your next appointment, please call your pharmacy*   Lab Work: Lab work to be done today--Lipids and ALT If you have labs (blood work) drawn today and your tests are completely normal, you will receive your results only by: [place] (if you have MyChart) OR A paper copy in the mail If you have any lab test that is abnormal or we need to change your treatment, we will call you to review the results.   Testing/Procedures: Your physician has requested that you have an echocardiogram. Echocardiography is a painless test that uses sound waves to create images of your heart. It provides your doctor with information about the size and shape of your heart and how well your heart's chambers and valves are working. This procedure takes approximately one hour. There are no restrictions for this procedure. Please do NOT wear cologne, perfume, aftershave, or lotions (deodorant is allowed). Please arrive 15 minutes prior to your appointment time.  Your physician has requested that you have a carotid duplex. This test is an ultrasound of the carotid arteries in your neck. It looks at blood flow through these arteries that supply the brain with blood. Allow one hour for this exam. There are no restrictions or special instructions. To be done in late June 2024   Follow-Up: At [facility], you and your health needs are our priority.  As part of our continuing mission to provide you with exceptional heart care, we have created designated Provider Care Teams.  These Care Teams include your primary Cardiologist (physician) and Advanced Practice Providers (APPs -  Physician Assistants and Nurse Practitioners) who all work together to provide you with the care you need,  when you need it.  We recommend signing up for the patient portal called "MyChart".  Sign up information is provided on this After Visit Summary.  MyChart is used to connect with patients for Virtual Visits (Telemedicine).  Patients are able to view lab/test results, encounter notes, upcoming appointments, etc.  Non-urgent messages can be sent to your provider as well.   To learn more about what you can do with MyChart, go to [email or web address].    Your next appointment:   12 month(s)  The format for your next appointment:   In Person  Provider:   Fransico Him, MD     Other Instructions   Important Information About Sugar

## 2022-11-04 ENCOUNTER — Telehealth: Payer: Self-pay

## 2022-11-04 DIAGNOSIS — E7800 Pure hypercholesterolemia, unspecified: Secondary | ICD-10-CM

## 2022-11-04 MED ORDER — ATORVASTATIN CALCIUM 40 MG PO TABS: 40.0000 mg | ORAL_TABLET | Freq: Every day | ORAL | 3 refills | Status: AC

## 2022-11-04 NOTE — Telephone Encounter (Signed)
Spoke with patient to review lab results and Dr Theodosia Blender recommendations. Patient verbalized understanding.

## 2022-11-18 ENCOUNTER — Encounter: Payer: Self-pay | Admitting: Pulmonary Disease

## 2022-11-19 MED ORDER — PREDNISONE 10 MG PO TABS: ORAL_TABLET | ORAL | 0 refills | Status: AC

## 2022-11-19 MED ORDER — AMOXICILLIN-POT CLAVULANATE 875-125 MG PO TABS: 1.0000 | ORAL_TABLET | Freq: Two times a day (BID) | ORAL | 0 refills | Status: DC

## 2022-11-19 NOTE — Telephone Encounter (Signed)
Mychart message sent by pt: Kelly Mooney [facility] (supporting Praveen Mannam, MD)12 hours ago (7:36 PM)    Will you please prescribe more Antibiotics (higher dose ) and Prednisone for at least 7 days PLEASE!!  PLEASE!!   I am experiencing chest tightness, short of breath, sore throat, slight cough, hoarseness, backache.   No COVID.    Pharmacy:  [facility]   Thank you so much. Kelly Mooney      Dr. Vaughan Browner, please advise.

## 2022-11-19 NOTE — Telephone Encounter (Signed)
Please call in Augmentin 875 mg twice daily for 7 days and prednisone starting at 60 mg/day.  Reduce dose by 10 mg every 2 days

## 2022-11-19 NOTE — Telephone Encounter (Signed)
Meds sent to pharmacy for pt and mychart message sent to pt letting her know this had been done. Nothing further needed.

## 2022-12-02 ENCOUNTER — Other Ambulatory Visit (HOSPITAL_BASED_OUTPATIENT_CLINIC_OR_DEPARTMENT_OTHER): Payer: Self-pay

## 2022-12-02 MED ORDER — MOUNJARO 2.5 MG/0.5ML ~~LOC~~ SOAJ
2.5000 mg | SUBCUTANEOUS | 6 refills | Status: DC
  Filled 2022-12-02: qty 2, 28d supply, fill #0

## 2022-12-07 ENCOUNTER — Ambulatory Visit (HOSPITAL_COMMUNITY): Payer: Medicare Other | Attending: Cardiology

## 2022-12-07 DIAGNOSIS — R0609 Other forms of dyspnea: Secondary | ICD-10-CM | POA: Insufficient documentation

## 2022-12-07 LAB — ECHOCARDIOGRAM COMPLETE
Area-P 1/2: 2.99 cm2
S' Lateral: 2.7 cm

## 2022-12-14 ENCOUNTER — Telehealth: Payer: Self-pay | Admitting: Cardiology

## 2022-12-14 NOTE — Telephone Encounter (Signed)
Patient asking what she was being tested for when she completed labs in December. Per Dr. Theodosia Blender note, Lipid and ALT labs not at goal. Atorvastatin was increased in December with orders for patient to complete repeat labs in 6 weeks to determine if new dose was working. Patient verbalized understanding.

## 2022-12-14 NOTE — Telephone Encounter (Signed)
Patient calling to see if labs on 1/31 is needed since she just had it done in December. Please advise

## 2022-12-16 ENCOUNTER — Other Ambulatory Visit: Payer: Medicare Other

## 2023-01-05 ENCOUNTER — Other Ambulatory Visit: Payer: Medicare Other

## 2023-01-11 ENCOUNTER — Encounter: Payer: Self-pay | Admitting: Pulmonary Disease

## 2023-01-11 MED ORDER — MOLNUPIRAVIR 200 MG PO CAPS: 4.0000 | ORAL_CAPSULE | Freq: Two times a day (BID) | ORAL | 0 refills | Status: AC

## 2023-01-11 MED ORDER — PREDNISONE 10 MG PO TABS: ORAL_TABLET | ORAL | 0 refills | Status: DC

## 2023-01-11 MED ORDER — BENZONATATE 100 MG PO CAPS: 100.0000 mg | ORAL_CAPSULE | Freq: Four times a day (QID) | ORAL | 1 refills | Status: AC | PRN

## 2023-01-11 NOTE — Telephone Encounter (Signed)
Beth can you please advise on the following My Chart message as Dr. Vaughan Browner is unavailable? Thank you

## 2023-01-11 NOTE — Telephone Encounter (Signed)
Pt notified via phone and My Chart. Nothing further needed at this time.

## 2023-01-11 NOTE — Telephone Encounter (Signed)
Ok with sending in [place] (antiviral) and tesslone perles. Would also take prednisone taper d.t hx COPD and ILD. Covid is viral abx not recommended right now. Rx have been sent. Needs follow-up in 1 week.

## 2023-01-11 NOTE — Addendum Note (Signed)
Addended by: Martyn Ehrich on: 01/11/2023 05:24 PM   Modules accepted: Orders

## 2023-01-11 NOTE — Telephone Encounter (Signed)
Spoke  with pt via phone who confirmed she had tested positive for Covid last night with symptoms starting yesterday. Pt c/o hest and head congestion with greenish-yellowish mucus, coughing,  shortness of breath and backache. Pt denies fever or chills. Pt is requesting the following medications. Dr. Vaughan Browner please advise.

## 2023-01-18 ENCOUNTER — Ambulatory Visit: Payer: Medicare Other | Attending: Cardiology

## 2023-01-18 DIAGNOSIS — E7800 Pure hypercholesterolemia, unspecified: Secondary | ICD-10-CM

## 2023-01-18 LAB — LIPID PANEL
Chol/HDL Ratio: 2.7 ratio (ref 0.0–4.4)
Cholesterol, Total: 120 mg/dL (ref 100–199)
HDL: 45 mg/dL (ref >39)
LDL Chol Calc (NIH): 50 mg/dL (ref 0–99)
Triglycerides: 146 mg/dL (ref 0–149)
VLDL Cholesterol Cal: 25 mg/dL (ref 5–40)

## 2023-01-18 LAB — ALT: ALT: 22 IU/L (ref 0–32)

## 2023-01-19 ENCOUNTER — Telehealth: Payer: Self-pay

## 2023-01-19 NOTE — Telephone Encounter (Signed)
-----   Message from Freada Bergeron, MD sent at 01/19/2023  8:19 AM EST ----- Cholesterol looks excellent. Liver function is normal

## 2023-01-19 NOTE — Telephone Encounter (Signed)
Normal results reviewed with patient who verbalizes understanding.

## 2023-03-18 ENCOUNTER — Other Ambulatory Visit: Payer: Self-pay | Admitting: Primary Care

## 2023-03-26 ENCOUNTER — Other Ambulatory Visit: Payer: Self-pay | Admitting: Family Medicine

## 2023-03-26 ENCOUNTER — Ambulatory Visit
Admission: RE | Admit: 2023-03-26 | Discharge: 2023-03-26 | Disposition: A | Discharge status: Home / Self Care | Payer: Medicare Other | Source: Ambulatory Visit | Attending: Family Medicine | Admitting: Family Medicine

## 2023-03-26 DIAGNOSIS — M546 Pain in thoracic spine: Secondary | ICD-10-CM

## 2023-04-21 ENCOUNTER — Ambulatory Visit: Payer: Medicare Other | Admitting: Pulmonary Disease

## 2023-05-10 ENCOUNTER — Encounter (HOSPITAL_COMMUNITY): Payer: Medicare Other

## 2023-05-13 ENCOUNTER — Ambulatory Visit (HOSPITAL_COMMUNITY)
Admission: RE | Admit: 2023-05-13 | Discharge: 2023-05-13 | Disposition: A | Discharge status: Home / Self Care | Payer: Medicare Other | Source: Ambulatory Visit | Attending: Cardiology | Admitting: Cardiology

## 2023-05-13 DIAGNOSIS — I6523 Occlusion and stenosis of bilateral carotid arteries: Secondary | ICD-10-CM | POA: Diagnosis present

## 2023-05-14 ENCOUNTER — Encounter: Payer: Self-pay | Admitting: Cardiology

## 2023-05-26 ENCOUNTER — Ambulatory Visit (INDEPENDENT_AMBULATORY_CARE_PROVIDER_SITE_OTHER): Payer: Medicare Other | Admitting: Pulmonary Disease

## 2023-05-26 ENCOUNTER — Encounter: Payer: Self-pay | Admitting: Pulmonary Disease

## 2023-05-26 VITALS — BP 116/58 | HR 106 | Temp 98.3°F | Ht 60.0 in | Wt 131.6 lb

## 2023-05-26 DIAGNOSIS — J849 Interstitial pulmonary disease, unspecified: Secondary | ICD-10-CM

## 2023-05-26 NOTE — Progress Notes (Signed)
Kelly Mooney    [id number]    01/22/1946  Primary Care Physician:Ross, Darlen Round, MD  Referring Physician: Daisy Floro, MD [address] [place],  [place] [address]  Chief complaint: Follow up for interstitial lung disease, COPD  HPI: 77 y.o. with history of asthma, COPD, hiatal hernia with GERD, esophageal stricture.  Previously followed by Dr. Delton Coombes.  Has been referred for evaluation of high-resolution CT showing interstitial lung disease  Reports mild dyspnea on exertion, cough which is nonproductive.  Over the past few days she has increasing cough and congestion with wheezing.  She follows with Dr. Marina Goodell for esophageal stricture and is on a PPI.  She continues to have difficulty swallowing and symptoms of heartburn.  History also notable for COVID-19 in November 2020 for which she was hospitalized. She has joint pains, morning stiffness, ulnar deviation of the digits and a strong family history of rheumatoid arthritis  Imaging so far has also suggested cirrhosis and liver lesions which turned out to be benign cysts on follow-up ultrasound of liver  Pets: No pets Occupation: Retired Astronomer Exposures: No mold, hot tub, Financial controller.  No feather pillows or comforters ILD questionnaire 03/12/22-negative for exposure Smoking history: 10-pack-year smoker.  Quit in 1997 Travel history: No significant travel history Relevant family history: No family history of lung disease  Interim history: She has been complaining of intermittent back pain and got up x-ray recently which did not show any abnormalities She is due for high-res CT later this year but is requiring an earlier scan and she wants her back evaluated as well. States that she lost some weight and breathing is improved.  Outpatient Encounter Medications as of 05/26/2023  Medication Sig   albuterol (PROVENTIL HFA;VENTOLIN HFA) 108 (90 BASE) MCG/ACT inhaler Inhale 2 puffs into the lungs 4 (four)  times daily.    albuterol (PROVENTIL) (2.5 MG/3ML) 0.083% nebulizer solution Take 2.5 mg by nebulization every 6 (six) hours as needed.   amLODipine (NORVASC) 10 MG tablet Take 10 mg by mouth daily with lunch.    aspirin EC 81 MG tablet Take 1 tablet (81 mg total) by mouth daily. Swallow whole.   atorvastatin (LIPITOR) 40 MG tablet Take 1 tablet (40 mg total) by mouth daily.   benzonatate (TESSALON) 100 MG capsule Take 1 capsule (100 mg total) by mouth every 6 (six) hours as needed for cough.   Budeson-Glycopyrrol-Formoterol (BREZTRI AEROSPHERE) 160-9-4.8 MCG/ACT AERO Inhale 2 puffs into the lungs in the morning and at bedtime.   chlorthalidone (HYGROTON) 25 MG tablet Take 25 mg by mouth daily.    clobetasol cream (TEMOVATE) 0.05 % Apply 1 application topically daily. Apply a thin layer to affected areas   fenofibrate 160 MG tablet Take 160 mg by mouth daily.   glipiZIDE (GLUCOTROL) 5 MG tablet Take 5 mg by mouth 2 (two) times daily.   hydrOXYzine (ATARAX/VISTARIL) 25 MG tablet Take 25 mg by mouth every 8 (eight) hours as needed.   irbesartan (AVAPRO) 300 MG tablet    levothyroxine (SYNTHROID, LEVOTHROID) 137 MCG tablet Take 137 mcg by mouth daily before breakfast.   mometasone (ELOCON) 0.1 % cream Apply 1 application topically every other day.    montelukast (SINGULAIR) 10 MG tablet Take 10 mg by mouth daily.    pantoprazole (PROTONIX) 40 MG tablet TAKE 1 TABLET (40 MG TOTAL) BY MOUTH DAILY.   Soft Lens Products (B & L SENSITIVE EYES SALINE) 0.4 % SOLN  Place 1 drop into both eyes daily as needed (Dry eye).   valACYclovir (VALTREX) 1000 MG tablet Take 2 tablets by mouth as needed (Cold Sore).  (Patient not taking: Reported on 05/26/2023)   [DISCONTINUED] amoxicillin-clavulanate (AUGMENTIN) 875-125 MG tablet Take 1 tablet by mouth 2 (two) times daily.   [DISCONTINUED] azithromycin (ZITHROMAX) 250 MG tablet Take as directed   [DISCONTINUED] benzonatate (TESSALON) 200 MG capsule Take 200 mg by mouth  3 (three) times daily.   [DISCONTINUED] pioglitazone (ACTOS) 15 MG tablet Take 15 mg by mouth daily.   [DISCONTINUED] predniSONE (DELTASONE) 10 MG tablet 4 tabs for 2 days, then 3 tabs for 2 days, 2 tabs for 2 days, then 1 tab for 2 days, then stop   [DISCONTINUED] tirzepatide (MOUNJARO) 2.5 MG/0.5ML Pen Inject 2.5 mg into the skin once a week for 28 days   No facility-administered encounter medications on file as of 05/26/2023.    Physical Exam: Blood pressure (!) 116/58, pulse (!) 106, temperature 98.3 F (36.8 C), temperature source Oral, height 5' (1.524 m), weight 131 lb 9.6 oz (59.7 kg), SpO2 97 %. 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  High-resolution CT chest 09/01/2022-limited by respiratory motion.  Continues to have mild, subtle changes of groundglass attenuation and septal thickening. I have reviewed the images personally   PFTs: 04/21/2021 FVC 2.18 [84%], FEV1 1.35 [70%], F/F 62, TLC 5.64 [118%], DLCO 18.50 [103%]  09/07/2022 FVC 1.92 [76%], FEV1 1.19 [64%], F/F 62, TLC 4.28 [89%], DLCO 14.48 [81%] Moderate obstruction with bronchodilator response, air trapping.    Labs: Autoimmune serologies 03/12/2022-negative  Assessment:  Evaluation for interstitial lung disease CT shows mild scarring at the base and probable UIP pattern.  This could be from  her prior COVID infection.  Other possibilities are ongoing aspiration due to esophageal stricture and hiatal hernia, connective tissue disease as she has a strong family history of rheumatoid arthritis and joint symptoms.  ILD serologies are negative We have reviewed the follow-up CT which is stable.  There is reduction in lung volume and diffusion capacity but still remains in the normal range.  After discussion in detail in office today we have decided to manage conservatively and monitor. Order high-res CT for follow-up  COPD, asthma overlap Continues on breztri inhaler.    Hiatal hernia, esophageal stricture, GERD Continue PPI.  Follows with Dr. Marina Goodell  Plan/Recommendations: Continue inhalers and PPI High-res CT follow-up  Chilton Greathouse MD [facility] 05/26/2023, 2:11 PM  CC: Daisy Floro, MD

## 2023-05-26 NOTE — Patient Instructions (Signed)
I am glad you are doing well with your breathing Will order high-resolution CT at next available for evaluation of the lungs Return to clinic in 6 months

## 2023-05-27 ENCOUNTER — Encounter: Payer: Self-pay | Admitting: Pulmonary Disease

## 2023-06-16 ENCOUNTER — Ambulatory Visit
Admission: RE | Admit: 2023-06-16 | Discharge: 2023-06-16 | Disposition: A | Discharge status: Home / Self Care | Payer: Medicare Other | Source: Ambulatory Visit | Attending: Pulmonary Disease | Admitting: Pulmonary Disease

## 2023-06-16 DIAGNOSIS — J849 Interstitial pulmonary disease, unspecified: Secondary | ICD-10-CM

## 2023-06-24 ENCOUNTER — Encounter: Payer: Self-pay | Admitting: Internal Medicine

## 2023-06-30 NOTE — Progress Notes (Deleted)
Cardiology Clinic Note   Date: 06/30/2023 ID: SIMRAN GRZYBEK, DOB 1946/02/07, MRN [id number]  Primary Cardiologist:  Armanda Magic, MD  Patient Profile    Kelly Mooney is a 77 y.o. female who presents to the clinic today for ***    Past medical history significant for: Coronary artery calcification. CT cardiac scoring 12/15/2020: Coronary calcium score 462 (83rd percentile). Nuclear stress test 10/15/2021: Normal, low risk study. Echo 12/07/2022: EF 60 to 65%.  No RWMA.  Normal diastolic parameters.  Normal strain.  Normal RV function.  Mild RVH.  Normal PA pressure no significant valvular abnormalities.  No significant changes from prior echo November 2022. Hypertension. Hyperlipidemia. Lipid panel 01/18/2023: LDL 50, HDL 45, TG 146, total 120.. Carotid artery stenosis.  Carotid duplex 05/13/2023: Bilateral ICA 1 to 39%. TIA. COPD. Interstitial lung disease. GERD. T2DM. Hypothyroidism.     History of Present Illness    Kelly Mooney evaluated by Dr. Mayford Knife on 07/18/2021 for shortness of breath and cardiomegaly at the request of Dr. Tenny Craw.  She had been seen in the emergency room the month prior for shortness of breath.  EKG showed sinus tachycardia at 102 bpm and chest x-ray showed possible early pneumonia and cardiomegaly.  Prior echo from August 2019 showed normal LV size and function with Grade I DD, calcified mitral valve annulus.  She reported chronic shortness of breath related to COPD with dyspnea when walking fast.  She had recently enrolled in pulmonary rehab.  Family history of heart disease; father died of MI at age 84, brother died of MI at age 69, another brother died of MI at age 32, and third brother with CAD/PCI.  Patient continues to be followed for the above outlined history.  Patient was last in the office by Dr. Mayford Knife on 11/03/2022 for routine follow-up.  She was doing well at that time and no changes were made.  Today, patient ***  Coronary artery  calcification.  CT cardiac scoring January 2022 showed calcium score 462.  Normal nuclear stress test November 2022.  Echo January 2024 showed normal LV/RV function, mild RVH, normal PA pressure. Patient *** Continue aspirin, atorvastatin. Hypertension: BP today *** Patient denies headaches, dizziness or vision changes. Continue amlodipine, irbesartan. Carotid artery stenosis.  Carotid duplex June 2024 showed bilateral ICA 1 to 39%.  Patient denies lightheadedness, dizziness, presyncope, syncope, speech difficulty, amaurosis fugax.  Will repeat June 2025. Hyperlipidemia.  LDL March 2024 50, at goal.  Continue atorvastatin.  ROS: All other systems reviewed and are otherwise negative except as noted in History of Present Illness.  Studies Reviewed       ***  Risk Assessment/Calculations    {Does this patient have ATRIAL FIBRILLATION?:[phone]} No BP recorded.  {Refresh Note OR Click here to enter BP  :1}***        Physical Exam    VS:  There were no vitals taken for this visit. , BMI There is no height or weight on file to calculate BMI.  GEN: Well nourished, well developed, in no acute distress. Neck: No JVD or carotid bruits. Cardiac: *** RRR. No murmurs. No rubs or gallops.   Respiratory:  Respirations regular and unlabored. Clear to auscultation without rales, wheezing or rhonchi. GI: Soft, nontender, nondistended. Extremities: Radials/DP/PT 2+ and equal bilaterally. No clubbing or cyanosis. No edema ***  Skin: Warm and dry, no rash. Neuro: Strength intact.  Assessment & Plan   ***  Disposition: ***     {Are you ordering  a CV Procedure (e.g. stress test, cath, DCCV, TEE, etc)?   Press F2        :[id number]}   Signed, Etta Grandchild. , DNP, NP-C

## 2023-07-01 ENCOUNTER — Ambulatory Visit: Payer: Medicare Other | Admitting: Student

## 2023-07-08 ENCOUNTER — Encounter: Payer: Self-pay | Admitting: Pulmonary Disease

## 2023-07-15 NOTE — Progress Notes (Signed)
Cardiology Clinic Note   Date: 07/20/2023 ID: ZINIA FITTIPALDI, DOB November 25, 1945, MRN [id number]  Primary Cardiologist:  Armanda Magic, MD  Patient Profile    Kelly Mooney is a 77 y.o. female who presents to the clinic today for review of CT results.    Past medical history significant for: Coronary artery calcification. CT cardiac scoring 12/15/2020: Coronary calcium score 462 (83rd percentile). Nuclear stress test 10/15/2021: Normal, low risk study. Echo 12/07/2022: EF 60 to 65%.  No RWMA.  Normal diastolic parameters.  Normal strain.  Normal RV function.  Mild RVH.  Normal PA pressure no significant valvular abnormalities.  No significant changes from prior echo November 2022. Hypertension. Hyperlipidemia. Lipid panel 01/18/2023: LDL 50, HDL 45, TG 146, total 120.. Carotid artery stenosis.  Carotid duplex 05/13/2023: Bilateral ICA 1 to 39%. TIA. COPD. Interstitial lung disease. GERD. T2DM. Hypothyroidism.     History of Present Illness    Kelly Mooney evaluated by Dr. Mayford Knife on 07/18/2021 for shortness of breath and cardiomegaly at the request of Dr. Tenny Craw.  She had been seen in the emergency room the month prior for shortness of breath.  EKG showed sinus tachycardia at 102 bpm and chest x-ray showed possible early pneumonia and cardiomegaly.  Prior echo from August 2019 showed normal LV size and function with Grade I DD, calcified mitral valve annulus.  She reported chronic shortness of breath related to COPD with dyspnea when walking fast.  She had recently enrolled in pulmonary rehab.  Family history of heart disease; father died of MI at age 2, brother died of MI at age 65, another brother died of MI at age 68, and third brother with CAD/PCI.  Patient continues to be followed for the above outlined history.  Patient was last in the office by Dr. Mayford Knife on 11/03/2022 for routine follow-up.  She was doing well at that time and no changes were made.  Today, patient is concerned  about findings of CT scan showing coronary artery atherosclerosis. Patient denies shortness of breath or dyspnea on exertion. No chest pain, pressure, or tightness. Denies lower extremity edema, orthopnea, or PND. No palpitations. Her activity is limited by neck pain and "I am lazy." She is starting physical therapy 2 days a week for her neck. She recently lost 51 lb on Mounjaro. She has since stopped taking it about a month ago secondary to not eating and feeling unsteady on her feet. She is concerned about her low BP. Home readings typically 90-110/50-60. Her amlodipine was stopped.    ROS: All other systems reviewed and are otherwise negative except as noted in History of Present Illness.  Studies Reviewed       EKG is not ordered today.       Physical Exam    VS:  BP 110/64   Pulse 81   Ht 5' (1.524 m)   Wt 128 lb 3.2 oz (58.2 kg)   SpO2 96%   BMI 25.04 kg/m  , BMI Body mass index is 25.04 kg/m.  GEN: Well nourished, well developed, in no acute distress. Neck: No JVD or carotid bruits. Cardiac:  RRR. No murmurs. No rubs or gallops.   Respiratory:  Respirations regular and unlabored. Clear to auscultation without rales, wheezing or rhonchi. GI: Soft, nontender, nondistended. Extremities: Radials/DP/PT 2+ and equal bilaterally. No clubbing or cyanosis. No edema.  Skin: Warm and dry, no rash. Neuro: Strength intact.  Assessment & Plan    Coronary artery calcification.  CT cardiac  scoring January 2022 showed calcium score 462.  Normal nuclear stress test November 2022.  Echo January 2024 showed normal LV/RV function, mild RVH, normal PA pressure. Patient is concerned about coronary artery atherosclerosis found on CT Chest. She denies chest pain, pressure or tightness. Ischemic evaluation not indicated at this time. Patient is encouraged to increase physical activity. She is a member of the rec center in [place]. Suggest water aerobics which is something she is interested in  doing. Will also provide information on Mediterranean diet as she is concerned about keeping the weight she has lost off.  Continue aspirin, atorvastatin. Hypertension: BP today 110/64. Patient denies headaches, dizziness or vision changes. Continue chlorthalidone. Will decrease irbesartan to 150 mg daily. She will continue to monitor BP. If trending up to >130/80 she will resume 300 mg daily.  Carotid artery stenosis.  Carotid duplex June 2024 showed bilateral ICA 1 to 39%.  Patient denies lightheadedness, dizziness, presyncope, syncope, speech difficulty, amaurosis fugax.  Will repeat June 2025. Hyperlipidemia.  LDL March 2024 50, at goal.  Continue atorvastatin.  Disposition: Decrease irbesartan to 150 mg daily. Return in 3-4 months or sooner as needed.          Signed, Etta Grandchild. Mihran Lebarron, DNP, NP-C

## 2023-07-20 ENCOUNTER — Ambulatory Visit: Payer: Medicare Other | Attending: Student | Admitting: Student

## 2023-07-20 ENCOUNTER — Encounter: Payer: Self-pay | Admitting: Student

## 2023-07-20 VITALS — BP 110/64 | HR 81 | Ht 60.0 in | Wt 128.2 lb

## 2023-07-20 DIAGNOSIS — I10 Essential (primary) hypertension: Secondary | ICD-10-CM

## 2023-07-20 DIAGNOSIS — I2510 Atherosclerotic heart disease of native coronary artery without angina pectoris: Secondary | ICD-10-CM

## 2023-07-20 DIAGNOSIS — I2584 Coronary atherosclerosis due to calcified coronary lesion: Secondary | ICD-10-CM

## 2023-07-20 DIAGNOSIS — I6523 Occlusion and stenosis of bilateral carotid arteries: Secondary | ICD-10-CM | POA: Diagnosis not present

## 2023-07-20 DIAGNOSIS — E785 Hyperlipidemia, unspecified: Secondary | ICD-10-CM | POA: Diagnosis not present

## 2023-07-20 NOTE — Patient Instructions (Signed)
Medication Instructions:  Your Irbesartan has been decreased to 150mg  daily.  Continue to monitor your blood pressure. Your goal is 130/80.  *If you need a refill on your cardiac medications before your next appointment, please call your pharmacy*   Lab Work: None ordered today If you have labs (blood work) drawn today and your tests are completely normal, you will receive your results only by: MyChart Message (if you have MyChart) OR A paper copy in the mail If you have any lab test that is abnormal or we need to change your treatment, we will call you to review the results.   Testing/Procedures: No testing ordered today   Follow-Up: At [facility], you and your health needs are our priority.  As part of our continuing mission to provide you with exceptional heart care, we have created designated Provider Care Teams.  These Care Teams include your primary Cardiologist (physician) and Advanced Practice Providers (APPs -  Physician Assistants and Nurse Practitioners) who all work together to provide you with the care you need, when you need it.  We recommend signing up for the patient portal called "MyChart".  Sign up information is provided on this After Visit Summary.  MyChart is used to connect with patients for Virtual Visits (Telemedicine).  Patients are able to view lab/test results, encounter notes, upcoming appointments, etc.  Non-urgent messages can be sent to your provider as well.   To learn more about what you can do with MyChart, go to [email or web address].    Your next appointment:   3-4 month(s)  Provider:   Armanda Magic, MD

## 2023-09-15 ENCOUNTER — Ambulatory Visit (INDEPENDENT_AMBULATORY_CARE_PROVIDER_SITE_OTHER): Payer: Medicare Other | Admitting: Internal Medicine

## 2023-09-15 ENCOUNTER — Encounter: Payer: Self-pay | Admitting: Internal Medicine

## 2023-09-15 VITALS — BP 122/70 | HR 74 | Ht 60.0 in | Wt 135.0 lb

## 2023-09-15 DIAGNOSIS — R1310 Dysphagia, unspecified: Secondary | ICD-10-CM

## 2023-09-15 DIAGNOSIS — K222 Esophageal obstruction: Secondary | ICD-10-CM

## 2023-09-15 DIAGNOSIS — K219 Gastro-esophageal reflux disease without esophagitis: Secondary | ICD-10-CM | POA: Diagnosis not present

## 2023-09-15 MED ORDER — PANTOPRAZOLE SODIUM 40 MG PO TBEC: 40.0000 mg | DELAYED_RELEASE_TABLET | Freq: Every day | ORAL | 3 refills | Status: DC

## 2023-09-15 NOTE — Progress Notes (Signed)
HISTORY OF PRESENT ILLNESS:  Kelly Mooney is a 77 y.o. 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  Patient presents today with recurrent and significant intermittent solid food dysphagia.  She was last seen May 23, 2020 when she underwent colonoscopy in anticipation of hemorrhoidectomy as well as upper endoscopy with esophageal dilation for dysphagia.  Colonoscopy revealed a diminutive adenoma and pandiverticulosis with sigmoid stenosis.  No follow-up recommended due to age.  Upper endoscopy revealed a fibrotic distal esophageal stricture and hiatal hernia.  She was balloon dilated to 18 mm.  Patient tells me that she has had recurrent intermittent solid food dysphagia, severe at times, over the past year.  She is been on PPI since losing weight.  No significant reflux symptoms.  She tells me that she lost approximately 51 pounds after she was on Mounjaro.  No longer on Mounjaro.  GI review of systems is otherwise negative  REVIEW OF SYSTEMS:  All non-GI ROS negative unless otherwise stated in the HPI except for sinus allergy trouble, arthritis, back pain, visual change, cough, fatigue, hearing problems, itching, sleeping problems  Past Medical History:  Diagnosis Date   Agatston coronary artery calcium score greater than 400    462 on coronary calcium score 09/2021   Arthritis    Asthma    since age 78   Carotid artery stenosis    1-39% bilateral carotid stenosis by dopplers 04/2023   Cataract    Colon polyps    Complication of anesthesia    slow to wake up    COPD (chronic obstructive pulmonary disease) (HCC)    COVID-19 09/2019   Diabetes mellitus    Diverticulosis    Dyspnea    with exertoin due to asthma     Esophageal stricture    Family history of adverse reaction to anesthesia    mother , daughter and son slow to wake up and  problems with N/V    GERD (gastroesophageal reflux disease)    Hemorrhoids    Hiatal hernia    History of kidney stones    Hypercholesteremia    Hypertension    Hypothyroidism    IBS (irritable bowel syndrome)    Paraesophageal hernia    PONV (postoperative nausea and vomiting)    Stroke (HCC)    no deficits    Tubular adenoma of colon     Past Surgical History:  Procedure Laterality Date   ABDOMINAL HYSTERECTOMY     BUNIONECTOMY Right 10/2012   CATARACT EXTRACTION, BILATERAL     CHOLECYSTECTOMY  1980   ESOPHAGEAL MANOMETRY N/A 07/15/2015   Procedure: ESOPHAGEAL MANOMETRY (EM);  Surgeon: Hilarie Fredrickson, MD;  Location: [facility];  Service: Endoscopy;  Laterality: N/A;   HEMORRHOID SURGERY N/A 08/01/2020   Procedure: HEMORRHOIDECTOMY, HEMORRHOIDAL LIGATION Manus Rudd ANORECTAL EXAMINATION UNDER ANESTHESIA;  Surgeon: Karie Soda, MD;  Location: [facility];  Service: General;  Laterality: N/A;  LOCAL   PARTIAL HYSTERECTOMY     RHINOPLASTY     nasal polyps removed dr. Sherlyn Lick    Social History Kelly Mooney  reports that she quit smoking about 26 years ago. Her smoking use included cigarettes. She started smoking about 36 years ago. She has a 15 pack-year smoking history. She has never used smokeless tobacco. She reports that she does  not drink alcohol and does not use drugs.  family history includes Asthma in her brother and mother; Diabetes Mellitus I in her mother; Heart attack in her brother, brother, and father; Hyperlipidemia in her mother.  Allergies  Allergen Reactions   Chloramphenicol Anaphylaxis   Ivp Dye [Iodinated Contrast Media] Anaphylaxis, Shortness Of Breath and Other (See Comments)    SEVERE convulsions, too    Crestor [Rosuvastatin Calcium]     Muscle cramps   Iodine Itching   Sulfa Antibiotics Rash       PHYSICAL EXAMINATION: Vital  signs: BP 122/70   Pulse 74   Ht 5' (1.524 m)   Wt 135 lb (61.2 kg)   BMI 26.37 kg/m   Constitutional: generally well-appearing, no acute distress Psychiatric: alert and oriented x3, cooperative Eyes: extraocular movements intact, anicteric, conjunctiva pink Mouth: oral pharynx moist, no lesions Neck: supple no lymphadenopathy Cardiovascular: heart regular rate and rhythm, no murmur Lungs: clear to auscultation bilaterally Abdomen: soft, nontender, nondistended, no obvious ascites, no peritoneal signs, normal bowel sounds, no organomegaly Rectal: Omitted Extremities: no clubbing, cyanosis, or lower extremity edema bilaterally Skin: no lesions on visible extremities Neuro: No focal deficits.  Cranial nerves intact  ASSESSMENT:  1.  GERD complicated by peptic stricture.  No longer on PPI.  Recurrent dysphagia. 2.  Dysphagia.  Intermittent solid food.  Likely due to known peptic stricture 3.  History of small paraesophageal hernia 4.  History of diverticulosis and diminutive adenoma on colonoscopy 2021.  Aged out of surveillance 5.  History of bile salt related diarrhea.  No longer on Questran.   PLAN:  1.  Reflux precautions 2.  Prescribe pantoprazole 40 mg daily.  Explained to her the importance of staying on this medication with reflux and do stricturing of the esophagus. 3.  Schedule upper endoscopy with esophageal dilation.  The patient is HIGHER than baseline risk due to her age and comorbidities.The nature of the procedure, as well as the risks, benefits, and alternatives were carefully and thoroughly reviewed with the patient. Ample time for discussion and questions allowed. The patient understood, was satisfied, and agreed to proceed. 4.  High-fiber diet

## 2023-09-15 NOTE — Patient Instructions (Signed)
We have sent the following medications to your pharmacy for you to pick up at your convenience:  Pantoprazole  You have been scheduled for an endoscopy. Please follow written instructions given to you at your visit today.  If you use inhalers (even only as needed), please bring them with you on the day of your procedure.  If you take any of the following medications, they will need to be adjusted prior to your procedure:   DO NOT TAKE 7 DAYS PRIOR TO TEST- Trulicity (dulaglutide) Ozempic, Wegovy (semaglutide) Mounjaro (tirzepatide) Bydureon Bcise (exanatide extended release)  DO NOT TAKE 1 DAY PRIOR TO YOUR TEST Rybelsus (semaglutide) Adlyxin (lixisenatide) Victoza (liraglutide) Byetta (exanatide) ___________________________________________________________________________   _______________________________________________________  If your blood pressure at your visit was 140/90 or greater, please contact your primary care physician to follow up on this.  _______________________________________________________  If you are age 69 or older, your body mass index should be between 23-30. Your Body mass index is 26.37 kg/m. If this is out of the aforementioned range listed, please consider follow up with your Primary Care Provider.  If you are age 57 or younger, your body mass index should be between 19-25. Your Body mass index is 26.37 kg/m. If this is out of the aformentioned range listed, please consider follow up with your Primary Care Provider.   ________________________________________________________  The [facility] providers would like to encourage you to use [facility] to communicate with providers for non-urgent requests or questions.  Due to long hold times on the telephone, sending your provider a message by [facility] may be a faster and more efficient way to get a response.  Please allow 48 business hours for a response.  Please remember that this is for non-urgent requests.   _______________________________________________________

## 2023-10-11 ENCOUNTER — Encounter: Payer: Self-pay | Admitting: Internal Medicine

## 2023-10-17 ENCOUNTER — Encounter: Payer: Self-pay | Admitting: Internal Medicine

## 2023-10-18 ENCOUNTER — Telehealth: Payer: Self-pay

## 2023-10-18 NOTE — Telephone Encounter (Signed)
Reviewed. Nothing overwhelming. If she has questions about anything, I refer her to the ordering physician.

## 2023-10-18 NOTE — Telephone Encounter (Signed)
Please review blood work in care everywhere per patient's request ( she has an upcoming procedure)

## 2023-10-27 ENCOUNTER — Ambulatory Visit (AMBULATORY_SURGERY_CENTER): Payer: Medicare Other | Admitting: Internal Medicine

## 2023-10-27 ENCOUNTER — Encounter: Payer: Self-pay | Admitting: Internal Medicine

## 2023-10-27 VITALS — BP 142/69 | HR 74 | Temp 98.2°F | Resp 21 | Ht 60.0 in | Wt 135.0 lb

## 2023-10-27 DIAGNOSIS — K219 Gastro-esophageal reflux disease without esophagitis: Secondary | ICD-10-CM

## 2023-10-27 DIAGNOSIS — K449 Diaphragmatic hernia without obstruction or gangrene: Secondary | ICD-10-CM

## 2023-10-27 DIAGNOSIS — K222 Esophageal obstruction: Secondary | ICD-10-CM

## 2023-10-27 DIAGNOSIS — Q399 Congenital malformation of esophagus, unspecified: Secondary | ICD-10-CM

## 2023-10-27 DIAGNOSIS — R1310 Dysphagia, unspecified: Secondary | ICD-10-CM

## 2023-10-27 MED ORDER — SODIUM CHLORIDE 0.9 % IV SOLN
500.0000 mL | INTRAVENOUS | Status: DC
Start: 2023-10-27 — End: 2023-10-27

## 2023-10-27 NOTE — Op Note (Signed)
[facility] Patient Name: Kelly Mooney Procedure Date: 10/27/2023 3:26 PM MRN: [id number] Endoscopist: Wilhemina Bonito. Marina Goodell , MD, [id number] Age: 77 Referring MD:  Date of Birth: 11-09-1946 Gender: Female Account #: [id number] Procedure:                Upper GI endoscopy with balloon dilation of the                            esophagus. 20 mm max Indications:              Dysphagia, Esophageal reflux Medicines:                Monitored Anesthesia Care Procedure:                Pre-Anesthesia Assessment:                           - Prior to the procedure, a History and Physical                            was performed, and patient medications and                            allergies were reviewed. The patient's tolerance of                            previous anesthesia was also reviewed. The risks                            and benefits of the procedure and the sedation                            options and risks were discussed with the patient.                            All questions were answered, and informed consent                            was obtained. Prior Anticoagulants: The patient has                            taken no anticoagulant or antiplatelet agents. ASA                            Grade Assessment: II - A patient with mild systemic                            disease. After reviewing the risks and benefits,                            the patient was deemed in satisfactory condition to                            undergo the procedure.  After obtaining informed consent, the endoscope was                            passed under direct vision. Throughout the                            procedure, the patient's blood pressure, pulse, and                            oxygen saturations were monitored continuously. The                            Olympus scope [id number] was introduced through the                            mouth, and advanced to  the second part of duodenum.                            The upper GI endoscopy was accomplished without                            difficulty. The patient tolerated the procedure                            well. Scope In: Scope Out: Findings:                 The esophagus was slightly tortuous and                            foreshortened. There was a ringlike stricture at                            the gastroesophageal junction measuring 15 mm.                            After completing the endoscopic survey, A TTS                            dilator was passed through the scope. Dilation with                            an 18-19-20 mm balloon dilator was performed to 20                            mm. There was slight mucosal disruption of the ring.                           The stomach revealed a large hiatal hernia. No                            other significant abnormalities.                           The examined  duodenum was grossly normal.                           The cardia and gastric fundus were normal on                            retroflexion. Complications:            No immediate complications. Estimated Blood Loss:     Estimated blood loss: none. Impression:               - Normal esophagus. Dilated.                           - Normal stomach with large hiatal hernia.                           - Normal examined duodenum.                           - No specimens collected. Recommendation:           - Patient has a contact number available for                            emergencies. The signs and symptoms of potential                            delayed complications were discussed with the                            patient. Return to normal activities tomorrow.                            Written discharge instructions were provided to the                            patient.                           -Post dilation diet.                           - Continue present  medications.                           -Return to the care of your primary provider. GI                            follow-up as needed Wilhemina Bonito. Marina Goodell, MD 10/27/2023 3:50:18 PM This report has been signed electronically.

## 2023-10-27 NOTE — Progress Notes (Signed)
Called to room to assist during endoscopic procedure.  Patient ID and intended procedure confirmed with present staff. Received instructions for my participation in the procedure from the performing physician.  

## 2023-10-27 NOTE — Progress Notes (Signed)
Vss nad trans to pacu 

## 2023-10-27 NOTE — Progress Notes (Signed)
Pt's states no medical or surgical changes since previsit or office visit. 

## 2023-10-27 NOTE — Patient Instructions (Signed)
    Follow post dilatation diet (given to you today)   Handout on Hiatal Hernia    Continue present medications    Return to care of your primary care physician.GI follow up as needed     YOU HAD AN ENDOSCOPIC PROCEDURE TODAY AT THE [facility]:   Refer to the procedure report that was given to you for any specific questions about what was found during the examination.  If the procedure report does not answer your questions, please call your gastroenterologist to clarify.  If you requested that your care partner not be given the details of your procedure findings, then the procedure report has been included in a sealed envelope for you to review at your convenience later.  YOU SHOULD EXPECT: Some feelings of bloating in the abdomen. Passage of more gas than usual.  Walking can help get rid of the air that was put into your GI tract during the procedure and reduce the bloating. If you had a lower endoscopy (such as a colonoscopy or flexible sigmoidoscopy) you may notice spotting of blood in your stool or on the toilet paper. If you underwent a bowel prep for your procedure, you may not have a normal bowel movement for a few days.  Please Note:  You might notice some irritation and congestion in your nose or some drainage.  This is from the oxygen used during your procedure.  There is no need for concern and it should clear up in a day or so.  SYMPTOMS TO REPORT IMMEDIATELY:   Following upper endoscopy (EGD)  Vomiting of blood or coffee ground material  New chest pain or pain under the shoulder blades  Painful or persistently difficult swallowing  New shortness of breath  Fever of 100F or higher  Black, tarry-looking stools  For urgent or emergent issues, a gastroenterologist can be reached at any hour by calling [phone]. Do not use MyChart messaging for urgent concerns.    DIET:  We do recommend a small meal at first, but then you may proceed to your regular  diet.  Drink plenty of fluids but you should avoid alcoholic beverages for 24 hours.  ACTIVITY:  You should plan to take it easy for the rest of today and you should NOT DRIVE or use heavy machinery until tomorrow (because of the sedation medicines used during the test).    FOLLOW UP: Our staff will call the number listed on your records the next business day following your procedure.  We will call around 7:15- 8:00 am to check on you and address any questions or concerns that you may have regarding the information given to you following your procedure. If we do not reach you, we will leave a message.     If any biopsies were taken you will be contacted by phone or by letter within the next 1-3 weeks.  Please call us at [phone] if you have not heard about the biopsies in 3 weeks.    SIGNATURES/CONFIDENTIALITY: You and/or your care partner have signed paperwork which will be entered into your electronic medical record.  These signatures attest to the fact that that the information above on your After Visit Summary has been reviewed and is understood.  Full responsibility of the confidentiality of this discharge information lies with you and/or your care-partner.

## 2023-10-27 NOTE — Progress Notes (Signed)
Expand All Collapse All HISTORY OF PRESENT ILLNESS:   Kelly Mooney is a 77 y.o. 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  Patient presents today with recurrent and significant intermittent solid food dysphagia.   She was last seen May 23, 2020 when she underwent colonoscopy in anticipation of hemorrhoidectomy as well as upper endoscopy with esophageal dilation for dysphagia.  Colonoscopy revealed a diminutive adenoma and pandiverticulosis with sigmoid stenosis.  No follow-up recommended due to age.  Upper endoscopy revealed a fibrotic distal esophageal stricture and hiatal hernia.  She was balloon dilated to 18 mm.   Patient tells me that she has had recurrent intermittent solid food dysphagia, severe at times, over the past year.  She is been on PPI since losing weight.  No significant reflux symptoms.  She tells me that she lost approximately 51 pounds after she was on Mounjaro.  No longer on Mounjaro.  GI review of systems is otherwise negative   REVIEW OF SYSTEMS:   All non-GI ROS negative unless otherwise stated in the HPI except for sinus allergy trouble, arthritis, back pain, visual change, cough, fatigue, hearing problems, itching, sleeping problems       Past Medical History:  Diagnosis Date   Agatston coronary artery calcium score greater than 400      462 on coronary calcium score 09/2021   Arthritis     Asthma      since age 57   Carotid artery stenosis      1-39% bilateral carotid stenosis by dopplers 04/2023   Cataract     Colon polyps     Complication of anesthesia      slow to wake up    COPD (chronic obstructive pulmonary disease) (HCC)     COVID-19 09/2019   Diabetes mellitus     Diverticulosis      Dyspnea      with exertoin due to asthma    Esophageal stricture     Family history of adverse reaction to anesthesia      mother , daughter and son slow to wake up and  problems with N/V    GERD (gastroesophageal reflux disease)     Hemorrhoids     Hiatal hernia     History of kidney stones     Hypercholesteremia     Hypertension     Hypothyroidism     IBS (irritable bowel syndrome)     Paraesophageal hernia     PONV (postoperative nausea and vomiting)     Stroke (HCC)      no deficits    Tubular adenoma of colon                 Past Surgical History:  Procedure Laterality Date   ABDOMINAL HYSTERECTOMY       BUNIONECTOMY Right 10/2012   CATARACT EXTRACTION, BILATERAL       CHOLECYSTECTOMY   1980   ESOPHAGEAL MANOMETRY N/A 07/15/2015    Procedure: ESOPHAGEAL MANOMETRY (EM);  Surgeon: Hilarie Fredrickson, MD;  Location: [facility];  Service: Endoscopy;  Laterality: N/A;   HEMORRHOID SURGERY N/A 08/01/2020    Procedure: HEMORRHOIDECTOMY, HEMORRHOIDAL LIGATION Manus Rudd ANORECTAL EXAMINATION UNDER ANESTHESIA;  Surgeon: Karie Soda, MD;  Location: [facility];  Service: General;  Laterality: N/A;  LOCAL  PARTIAL HYSTERECTOMY       RHINOPLASTY        nasal polyps removed dr. Sherlyn Lick          Social History [name]  reports that she quit smoking about 26 years ago. Her smoking use included cigarettes. She started smoking about 36 years ago. She has a 15 pack-year smoking history. She has never used smokeless tobacco. She reports that she does not drink alcohol and does not use drugs.   family history includes Asthma in her brother and mother; Diabetes Mellitus I in her mother; Heart attack in her brother, brother, and father; Hyperlipidemia in her mother.   Allergies       Allergies  Allergen Reactions   Chloramphenicol Anaphylaxis   Ivp Dye [Iodinated Contrast Media] Anaphylaxis, Shortness Of Breath and Other (See Comments)      SEVERE convulsions, too     Crestor  [Rosuvastatin Calcium]        Muscle cramps   Iodine Itching   Sulfa Antibiotics Rash            PHYSICAL EXAMINATION: Vital signs: BP 122/70   Pulse 74   Ht 5' (1.524 m)   Wt 135 lb (61.2 kg)   BMI 26.37 kg/m   Constitutional: generally well-appearing, no acute distress Psychiatric: alert and oriented x3, cooperative Eyes: extraocular movements intact, anicteric, conjunctiva pink Mouth: oral pharynx moist, no lesions Neck: supple no lymphadenopathy Cardiovascular: heart regular rate and rhythm, no murmur Lungs: clear to auscultation bilaterally Abdomen: soft, nontender, nondistended, no obvious ascites, no peritoneal signs, normal bowel sounds, no organomegaly Rectal: Omitted Extremities: no clubbing, cyanosis, or lower extremity edema bilaterally Skin: no lesions on visible extremities Neuro: No focal deficits.  Cranial nerves intact   ASSESSMENT:   1.  GERD complicated by peptic stricture.  No longer on PPI.  Recurrent dysphagia. 2.  Dysphagia.  Intermittent solid food.  Likely due to known peptic stricture 3.  History of small paraesophageal hernia 4.  History of diverticulosis and diminutive adenoma on colonoscopy 2021.  Aged out of surveillance 5.  History of bile salt related diarrhea.  No longer on Questran.     PLAN:   1.  Reflux precautions 2.  Prescribe pantoprazole 40 mg daily.  Explained to her the importance of staying on this medication with reflux and do stricturing of the esophagus. 3.  Schedule upper endoscopy with esophageal dilation.  The patient is HIGHER than baseline risk due to her age and comorbidities.The nature of the procedure, as well as the risks, benefits, and alternatives were carefully and thoroughly reviewed with the patient. Ample time for discussion and questions allowed. The patient understood, was satisfied, and agreed to proceed. 4.  High-fiber diet

## 2023-10-28 ENCOUNTER — Telehealth: Payer: Self-pay | Admitting: *Deleted

## 2023-10-28 NOTE — Telephone Encounter (Signed)
Post procedure follow up phone call. No answer at number given.  Left message on voicemail.  

## 2023-11-16 ENCOUNTER — Other Ambulatory Visit: Payer: Self-pay | Admitting: Pulmonary Disease

## 2024-02-21 ENCOUNTER — Encounter: Payer: Self-pay | Admitting: Internal Medicine

## 2024-02-21 MED ORDER — PANTOPRAZOLE SODIUM 40 MG PO TBEC: 40.0000 mg | DELAYED_RELEASE_TABLET | Freq: Every day | ORAL | 2 refills | Status: DC

## 2024-03-19 ENCOUNTER — Observation Stay (HOSPITAL_BASED_OUTPATIENT_CLINIC_OR_DEPARTMENT_OTHER)
Admission: EM | Admit: 2024-03-19 | Discharge: 2024-03-22 | Disposition: A | Discharge status: Home / Self Care | Attending: Family Medicine | Admitting: Family Medicine

## 2024-03-19 ENCOUNTER — Emergency Department (HOSPITAL_BASED_OUTPATIENT_CLINIC_OR_DEPARTMENT_OTHER)

## 2024-03-19 ENCOUNTER — Encounter (HOSPITAL_BASED_OUTPATIENT_CLINIC_OR_DEPARTMENT_OTHER): Payer: Self-pay | Admitting: Emergency Medicine

## 2024-03-19 DIAGNOSIS — Z87891 Personal history of nicotine dependence: Secondary | ICD-10-CM | POA: Diagnosis not present

## 2024-03-19 DIAGNOSIS — Z7984 Long term (current) use of oral hypoglycemic drugs: Secondary | ICD-10-CM | POA: Diagnosis not present

## 2024-03-19 DIAGNOSIS — K209 Esophagitis, unspecified without bleeding: Secondary | ICD-10-CM | POA: Insufficient documentation

## 2024-03-19 DIAGNOSIS — R7989 Other specified abnormal findings of blood chemistry: Secondary | ICD-10-CM | POA: Insufficient documentation

## 2024-03-19 DIAGNOSIS — D649 Anemia, unspecified: Secondary | ICD-10-CM | POA: Insufficient documentation

## 2024-03-19 DIAGNOSIS — E039 Hypothyroidism, unspecified: Secondary | ICD-10-CM | POA: Insufficient documentation

## 2024-03-19 DIAGNOSIS — N179 Acute kidney failure, unspecified: Secondary | ICD-10-CM | POA: Diagnosis not present

## 2024-03-19 DIAGNOSIS — E1122 Type 2 diabetes mellitus with diabetic chronic kidney disease: Secondary | ICD-10-CM | POA: Diagnosis not present

## 2024-03-19 DIAGNOSIS — Z79899 Other long term (current) drug therapy: Secondary | ICD-10-CM | POA: Insufficient documentation

## 2024-03-19 DIAGNOSIS — Z8616 Personal history of COVID-19: Secondary | ICD-10-CM | POA: Diagnosis not present

## 2024-03-19 DIAGNOSIS — Z8673 Personal history of transient ischemic attack (TIA), and cerebral infarction without residual deficits: Secondary | ICD-10-CM | POA: Diagnosis not present

## 2024-03-19 DIAGNOSIS — Z7982 Long term (current) use of aspirin: Secondary | ICD-10-CM | POA: Diagnosis not present

## 2024-03-19 DIAGNOSIS — E876 Hypokalemia: Secondary | ICD-10-CM | POA: Insufficient documentation

## 2024-03-19 DIAGNOSIS — E119 Type 2 diabetes mellitus without complications: Secondary | ICD-10-CM

## 2024-03-19 DIAGNOSIS — J449 Chronic obstructive pulmonary disease, unspecified: Secondary | ICD-10-CM | POA: Insufficient documentation

## 2024-03-19 DIAGNOSIS — K449 Diaphragmatic hernia without obstruction or gangrene: Secondary | ICD-10-CM | POA: Insufficient documentation

## 2024-03-19 DIAGNOSIS — N1832 Chronic kidney disease, stage 3b: Secondary | ICD-10-CM | POA: Diagnosis not present

## 2024-03-19 DIAGNOSIS — D72829 Elevated white blood cell count, unspecified: Secondary | ICD-10-CM | POA: Diagnosis not present

## 2024-03-19 DIAGNOSIS — R079 Chest pain, unspecified: Secondary | ICD-10-CM | POA: Diagnosis present

## 2024-03-19 DIAGNOSIS — I129 Hypertensive chronic kidney disease with stage 1 through stage 4 chronic kidney disease, or unspecified chronic kidney disease: Secondary | ICD-10-CM | POA: Diagnosis not present

## 2024-03-19 DIAGNOSIS — R0602 Shortness of breath: Secondary | ICD-10-CM | POA: Insufficient documentation

## 2024-03-19 DIAGNOSIS — N189 Chronic kidney disease, unspecified: Secondary | ICD-10-CM | POA: Diagnosis present

## 2024-03-19 DIAGNOSIS — I10 Essential (primary) hypertension: Secondary | ICD-10-CM | POA: Diagnosis present

## 2024-03-19 LAB — CBC WITH DIFFERENTIAL/PLATELET
Abs Immature Granulocytes: 0.03 10*3/uL (ref 0.00–0.07)
Basophils Absolute: 0.1 10*3/uL (ref 0.0–0.1)
Basophils Relative: 1 %
Eosinophils Absolute: 0.1 10*3/uL (ref 0.0–0.5)
Eosinophils Relative: 1 %
HCT: 35.1 % — ABNORMAL LOW (ref 36.0–46.0)
Hemoglobin: 11.8 g/dL — ABNORMAL LOW (ref 12.0–15.0)
Immature Granulocytes: 0 %
Lymphocytes Relative: 20 %
Lymphs Abs: 2.1 10*3/uL (ref 0.7–4.0)
MCH: 29.1 pg (ref 26.0–34.0)
MCHC: 33.6 g/dL (ref 30.0–36.0)
MCV: 86.7 fL (ref 80.0–100.0)
Monocytes Absolute: 0.6 10*3/uL (ref 0.1–1.0)
Monocytes Relative: 5 %
Neutro Abs: 8 10*3/uL — ABNORMAL HIGH (ref 1.7–7.7)
Neutrophils Relative %: 73 %
Platelets: 217 10*3/uL (ref 150–400)
RBC: 4.05 MIL/uL (ref 3.87–5.11)
RDW: 13.2 % (ref 11.5–15.5)
WBC: 10.9 10*3/uL — ABNORMAL HIGH (ref 4.0–10.5)
nRBC: 0 % (ref 0.0–0.2)

## 2024-03-19 LAB — COMPREHENSIVE METABOLIC PANEL WITH GFR
ALT: 15 U/L (ref 0–44)
AST: 21 U/L (ref 15–41)
Albumin: 4.2 g/dL (ref 3.5–5.0)
Alkaline Phosphatase: 39 U/L (ref 38–126)
Anion gap: 16 — ABNORMAL HIGH (ref 5–15)
BUN: 31 mg/dL — ABNORMAL HIGH (ref 8–23)
CO2: 20 mmol/L — ABNORMAL LOW (ref 22–32)
Calcium: 10.1 mg/dL (ref 8.9–10.3)
Chloride: 107 mmol/L (ref 98–111)
Creatinine, Ser: 1.47 mg/dL — ABNORMAL HIGH (ref 0.44–1.00)
GFR, Estimated: 36 mL/min — ABNORMAL LOW (ref >60)
Glucose, Bld: 133 mg/dL — ABNORMAL HIGH (ref 70–99)
Potassium: 3.9 mmol/L (ref 3.5–5.1)
Sodium: 143 mmol/L (ref 135–145)
Total Bilirubin: 0.6 mg/dL (ref 0.0–1.2)
Total Protein: 6.5 g/dL (ref 6.5–8.1)

## 2024-03-19 LAB — TROPONIN T, HIGH SENSITIVITY
Troponin T High Sensitivity: 30 ng/L — ABNORMAL HIGH (ref <19)
Troponin T High Sensitivity: 27 ng/L — ABNORMAL HIGH (ref <19)

## 2024-03-19 LAB — PRO BRAIN NATRIURETIC PEPTIDE: Pro Brain Natriuretic Peptide: 519 pg/mL — ABNORMAL HIGH (ref <300.0)

## 2024-03-19 LAB — LIPASE, BLOOD: Lipase: 33 U/L (ref 11–51)

## 2024-03-19 MED ORDER — ONDANSETRON HCL 4 MG/2ML IJ SOLN
4.0000 mg | Freq: Once | INTRAMUSCULAR | Status: AC
  Administered 2024-03-19: 4 mg via INTRAVENOUS
  Filled 2024-03-19: qty 2

## 2024-03-19 MED ORDER — PIPERACILLIN-TAZOBACTAM 3.375 G IVPB 30 MIN
3.3750 g | Freq: Once | INTRAVENOUS | Status: AC
  Administered 2024-03-19: 3.375 g via INTRAVENOUS
  Filled 2024-03-19: qty 50

## 2024-03-19 MED ORDER — NITROGLYCERIN 0.4 MG SL SUBL
0.4000 mg | SUBLINGUAL_TABLET | SUBLINGUAL | Status: DC | PRN
  Administered 2024-03-19 – 2024-03-21 (×4): 0.4 mg via SUBLINGUAL
  Filled 2024-03-19 (×3): qty 1

## 2024-03-19 MED ORDER — MORPHINE SULFATE (PF) 4 MG/ML IV SOLN
4.0000 mg | Freq: Once | INTRAVENOUS | Status: AC
  Administered 2024-03-19: 4 mg via INTRAVENOUS
  Filled 2024-03-19: qty 1

## 2024-03-19 NOTE — Progress Notes (Signed)
 ED Pharmacy Antibiotic Sign Off An antibiotic consult was received from an ED provider for zosyn per pharmacy dosing for intra-abdominal infection. A chart review was completed to assess appropriateness.   The following one time order(s) were placed:  Zosyn 3.375mg  x1   Further antibiotic and/or antibiotic pharmacy consults should be ordered by the admitting provider if indicated.   Thank you for allowing pharmacy to be a part of this patient's care.   Mamie Searles, PharmD, BCCCP  Clinical Pharmacist 03/19/24 8:28 PM

## 2024-03-19 NOTE — ED Provider Notes (Signed)
 [place] [facility] Provider Note   CSN: [id number] Arrival date & time: 03/19/24  1545     History {Add pertinent medical, surgical, social history, OB history to HPI:1} Chief Complaint  Patient presents with   Chest Pain    Kelly Mooney is a 78 y.o. female.  HPI    78 year old female with a history of asthma, COPD, diabetes, hypertension, hyperlipidemia, hypothyroidism, CVA  This afternoon was in the kitchen and suddenly started to get sick, violently vomiting, started and then the chest pain.  Vomited two more times then began having severe spasms in chest, unrelenting, losing breath some because of sharp pains in chest. Family hx of MI.  Took 2 tums as has hx of GERD/reflux but it just kept on.  Still having severe pain 10/10. Sharp, burns, right in the center of chest. Has some shortness of breath, attributes some to COPD.  Cough more today with this going on. No coughing anything up.  No fever.  No diarrhea. No abdominal pain. Does have some pain in back.    Hiatal hernia, esophageal stricture  Past Medical History:  Diagnosis Date   Agatston coronary artery calcium  score greater than 400    462 on coronary calcium  score 09/2021   Arthritis    Asthma    since age 53   Carotid artery stenosis    1-39% bilateral carotid stenosis by dopplers 04/2023   Cataract    Colon polyps    Complication of anesthesia    slow to wake up    COPD (chronic obstructive pulmonary disease) (HCC)    COVID-19 09/2019   Diabetes mellitus    Diverticulosis    Dyspnea    with exertoin due to asthma    Esophageal stricture    Family history of adverse reaction to anesthesia    mother , daughter and son slow to wake up and  problems with N/V    GERD (gastroesophageal reflux disease)    Hemorrhoids    Hiatal hernia    History of kidney stones    Hypercholesteremia    Hypertension    Hypothyroidism    IBS (irritable bowel syndrome)     Paraesophageal hernia    PONV (postoperative nausea and vomiting)    Stroke (HCC)    no deficits    Tubular adenoma of colon      Home Medications Prior to Admission medications   Medication Sig Start Date End Date Taking? Authorizing Provider  albuterol  (PROVENTIL  HFA;VENTOLIN  HFA) 108 (90 BASE) MCG/ACT inhaler Inhale 2 puffs into the lungs 4 (four) times daily.     [provider]  albuterol  (PROVENTIL ) (2.5 MG/3ML) 0.083% nebulizer solution Take 2.5 mg by nebulization every 6 (six) hours as needed. 02/03/21   [provider]  amLODipine  (NORVASC ) 10 MG tablet Take 10 mg by mouth daily with lunch.    [provider]  aspirin  EC 81 MG tablet Take 1 tablet (81 mg total) by mouth daily. Swallow whole. 10/15/21   Jacqueline Matsu, MD  atorvastatin  (LIPITOR) 40 MG tablet Take 1 tablet (40 mg total) by mouth daily. 11/04/22   Jacqueline Matsu, MD  benzonatate  (TESSALON ) 100 MG capsule Take 1 capsule (100 mg total) by mouth every 6 (six) hours as needed for cough. 01/11/23   Antonio Baumgarten, NP  Budeson-Glycopyrrol-Formoterol  (BREZTRI  AEROSPHERE) 160-9-4.8 MCG/ACT AERO INHALE 2 PUFFS BY MOUTH INTO THE LUNGS IN THE MORNING AND AT BEDTIME 11/18/23  Mannam, Praveen, MD  chlorthalidone (HYGROTON) 25 MG tablet Take 25 mg by mouth daily.  09/18/19   [provider]  clobetasol cream (TEMOVATE) 0.05 % Apply 1 application topically daily. Apply a thin layer to affected areas 02/17/16   [provider]  fenofibrate  160 MG tablet Take 160 mg by mouth daily. 07/27/19   [provider]  glipiZIDE (GLUCOTROL) 5 MG tablet Take 5 mg by mouth 2 (two) times daily. 11/12/20   [provider]  hydrOXYzine (ATARAX/VISTARIL) 25 MG tablet Take 25 mg by mouth every 8 (eight) hours as needed. 12/25/20   [provider]  irbesartan (AVAPRO) 150 MG tablet Take 150 mg by mouth daily. 11/19/21   [provider]  levothyroxine  (SYNTHROID ) 125 MCG tablet  Take 125 mcg by mouth daily before breakfast.    [provider]  Magnesium Oxide -Mg Supplement 200 MG TABS Take 400 mg by mouth daily.    [provider]  mometasone  (ELOCON ) 0.1 % cream Apply 1 application topically every other day.  02/27/20   [provider]  montelukast  (SINGULAIR ) 10 MG tablet Take 10 mg by mouth daily.  03/21/15   [provider]  pantoprazole  (PROTONIX ) 40 MG tablet Take 1 tablet (40 mg total) by mouth daily. 02/21/24   Tobin Forts, MD  Soft Lens Products (B & L SENSITIVE EYES SALINE) 0.4 % SOLN Place 1 drop into both eyes daily as needed (Dry eye).    [provider]  valACYclovir (VALTREX) 1000 MG tablet Take 2 tablets by mouth as needed (Cold Sore). 11/28/19   [provider]      Allergies    Chloramphenicol, Ivp dye [iodinated contrast media], Crestor  [rosuvastatin  calcium ], Iodine, and Sulfa antibiotics    Review of Systems   Review of Systems  Physical Exam Updated Vital Signs BP (!) 147/103 (BP Location: Left Arm)   Pulse 98   Temp 98 F (36.7 C)   Resp (!) 28   Ht 5' (1.524 m)   Wt 68 kg   SpO2 100%   BMI 29.29 kg/m  Physical Exam  ED Results / Procedures / Treatments   Labs (all labs ordered are listed, but only abnormal results are displayed) Labs Reviewed - No data to display  EKG None  Radiology No results found.  Procedures Procedures  {Document cardiac monitor, telemetry assessment procedure when appropriate:1}  Medications Ordered in ED Medications - No data to display  ED Course/ Medical Decision Making/ A&P   {   Click here for ABCD2, HEART and other calculatorsREFRESH Note before signing :1}                              Medical Decision Making Amount and/or Complexity of Data Reviewed Labs: ordered. Radiology: ordered.  Risk Prescription drug management.   ***  {Document critical care time when appropriate:1} {Document review of labs and clinical decision  tools ie heart score, Chads2Vasc2 etc:1}  {Document your independent review of radiology images, and any outside records:1} {Document your discussion with family members, caretakers, and with consultants:1} {Document social determinants of health affecting pt's care:1} {Document your decision making why or why not admission, treatments were needed:1} Final Clinical Impression(s) / ED Diagnoses Final diagnoses:  None    Rx / DC Orders ED Discharge Orders     None

## 2024-03-19 NOTE — ED Triage Notes (Signed)
 Pt reports central CP that started 1.5 hrs ago; vomited x 3 since that time; feels like someone is stepping all over her; feels SHOB, hx of COPD

## 2024-03-19 NOTE — ED Notes (Signed)
 Pt advised she has a hiatal hernia "a bad one" and has been violently throwing gup the last few days. Pain is located in same region.

## 2024-03-20 ENCOUNTER — Observation Stay (HOSPITAL_COMMUNITY)

## 2024-03-20 ENCOUNTER — Other Ambulatory Visit: Payer: Self-pay

## 2024-03-20 DIAGNOSIS — E876 Hypokalemia: Secondary | ICD-10-CM | POA: Diagnosis not present

## 2024-03-20 DIAGNOSIS — N189 Chronic kidney disease, unspecified: Secondary | ICD-10-CM

## 2024-03-20 DIAGNOSIS — R0602 Shortness of breath: Secondary | ICD-10-CM | POA: Diagnosis not present

## 2024-03-20 DIAGNOSIS — K449 Diaphragmatic hernia without obstruction or gangrene: Secondary | ICD-10-CM | POA: Diagnosis not present

## 2024-03-20 DIAGNOSIS — R079 Chest pain, unspecified: Secondary | ICD-10-CM

## 2024-03-20 DIAGNOSIS — R7989 Other specified abnormal findings of blood chemistry: Secondary | ICD-10-CM | POA: Diagnosis present

## 2024-03-20 DIAGNOSIS — D72829 Elevated white blood cell count, unspecified: Secondary | ICD-10-CM | POA: Diagnosis not present

## 2024-03-20 DIAGNOSIS — E1122 Type 2 diabetes mellitus with diabetic chronic kidney disease: Secondary | ICD-10-CM | POA: Diagnosis not present

## 2024-03-20 DIAGNOSIS — N1832 Chronic kidney disease, stage 3b: Secondary | ICD-10-CM | POA: Diagnosis not present

## 2024-03-20 DIAGNOSIS — D649 Anemia, unspecified: Secondary | ICD-10-CM | POA: Diagnosis present

## 2024-03-20 DIAGNOSIS — J449 Chronic obstructive pulmonary disease, unspecified: Secondary | ICD-10-CM | POA: Diagnosis not present

## 2024-03-20 DIAGNOSIS — Z87891 Personal history of nicotine dependence: Secondary | ICD-10-CM | POA: Diagnosis not present

## 2024-03-20 DIAGNOSIS — Z7984 Long term (current) use of oral hypoglycemic drugs: Secondary | ICD-10-CM | POA: Diagnosis not present

## 2024-03-20 DIAGNOSIS — K209 Esophagitis, unspecified without bleeding: Secondary | ICD-10-CM | POA: Diagnosis present

## 2024-03-20 DIAGNOSIS — E119 Type 2 diabetes mellitus without complications: Secondary | ICD-10-CM

## 2024-03-20 DIAGNOSIS — N179 Acute kidney failure, unspecified: Secondary | ICD-10-CM

## 2024-03-20 DIAGNOSIS — I129 Hypertensive chronic kidney disease with stage 1 through stage 4 chronic kidney disease, or unspecified chronic kidney disease: Secondary | ICD-10-CM | POA: Diagnosis not present

## 2024-03-20 DIAGNOSIS — Z7982 Long term (current) use of aspirin: Secondary | ICD-10-CM | POA: Diagnosis not present

## 2024-03-20 DIAGNOSIS — Z79899 Other long term (current) drug therapy: Secondary | ICD-10-CM | POA: Diagnosis not present

## 2024-03-20 DIAGNOSIS — Z8673 Personal history of transient ischemic attack (TIA), and cerebral infarction without residual deficits: Secondary | ICD-10-CM | POA: Diagnosis not present

## 2024-03-20 DIAGNOSIS — E039 Hypothyroidism, unspecified: Secondary | ICD-10-CM

## 2024-03-20 DIAGNOSIS — I10 Essential (primary) hypertension: Secondary | ICD-10-CM

## 2024-03-20 DIAGNOSIS — Z8616 Personal history of COVID-19: Secondary | ICD-10-CM | POA: Diagnosis not present

## 2024-03-20 LAB — CBC WITH DIFFERENTIAL/PLATELET
Abs Immature Granulocytes: 0.02 10*3/uL (ref 0.00–0.07)
Basophils Absolute: 0.1 10*3/uL (ref 0.0–0.1)
Basophils Relative: 1 %
Eosinophils Absolute: 0.1 10*3/uL (ref 0.0–0.5)
Eosinophils Relative: 2 %
HCT: 34.1 % — ABNORMAL LOW (ref 36.0–46.0)
Hemoglobin: 11 g/dL — ABNORMAL LOW (ref 12.0–15.0)
Immature Granulocytes: 0 %
Lymphocytes Relative: 28 %
Lymphs Abs: 2.3 10*3/uL (ref 0.7–4.0)
MCH: 29 pg (ref 26.0–34.0)
MCHC: 32.3 g/dL (ref 30.0–36.0)
MCV: 90 fL (ref 80.0–100.0)
Monocytes Absolute: 0.5 10*3/uL (ref 0.1–1.0)
Monocytes Relative: 6 %
Neutro Abs: 5 10*3/uL (ref 1.7–7.7)
Neutrophils Relative %: 63 %
Platelets: 219 10*3/uL (ref 150–400)
RBC: 3.79 MIL/uL — ABNORMAL LOW (ref 3.87–5.11)
RDW: 13.6 % (ref 11.5–15.5)
WBC: 8 10*3/uL (ref 4.0–10.5)
nRBC: 0 % (ref 0.0–0.2)

## 2024-03-20 LAB — BASIC METABOLIC PANEL WITH GFR
Anion gap: 11 (ref 5–15)
BUN: 37 mg/dL — ABNORMAL HIGH (ref 8–23)
CO2: 24 mmol/L (ref 22–32)
Calcium: 9.5 mg/dL (ref 8.9–10.3)
Chloride: 109 mmol/L (ref 98–111)
Creatinine, Ser: 2 mg/dL — ABNORMAL HIGH (ref 0.44–1.00)
GFR, Estimated: 25 mL/min — ABNORMAL LOW (ref >60)
Glucose, Bld: 100 mg/dL — ABNORMAL HIGH (ref 70–99)
Potassium: 4.6 mmol/L (ref 3.5–5.1)
Sodium: 144 mmol/L (ref 135–145)

## 2024-03-20 LAB — URINALYSIS, ROUTINE W REFLEX MICROSCOPIC
Bilirubin Urine: NEGATIVE
Glucose, UA: NEGATIVE mg/dL
Hgb urine dipstick: NEGATIVE
Ketones, ur: NEGATIVE mg/dL
Leukocytes,Ua: NEGATIVE
Nitrite: NEGATIVE
Protein, ur: NEGATIVE mg/dL
Specific Gravity, Urine: 1.017 (ref 1.005–1.030)
pH: 5 (ref 5.0–8.0)

## 2024-03-20 LAB — TSH: TSH: 0.457 u[IU]/mL (ref 0.350–4.500)

## 2024-03-20 MED ORDER — SODIUM CHLORIDE 0.9% FLUSH
3.0000 mL | Freq: Two times a day (BID) | INTRAVENOUS | Status: DC
  Administered 2024-03-20 – 2024-03-22 (×5): 3 mL via INTRAVENOUS

## 2024-03-20 MED ORDER — ALBUTEROL SULFATE (2.5 MG/3ML) 0.083% IN NEBU
2.5000 mg | INHALATION_SOLUTION | Freq: Four times a day (QID) | RESPIRATORY_TRACT | Status: DC
  Administered 2024-03-20: 2.5 mg via RESPIRATORY_TRACT
  Filled 2024-03-20 (×2): qty 3

## 2024-03-20 MED ORDER — ACETAMINOPHEN 325 MG PO TABS: 650.0000 mg | ORAL_TABLET | Freq: Four times a day (QID) | ORAL | Status: DC | PRN

## 2024-03-20 MED ORDER — IPRATROPIUM-ALBUTEROL 0.5-2.5 (3) MG/3ML IN SOLN
3.0000 mL | Freq: Once | RESPIRATORY_TRACT | Status: AC
  Administered 2024-03-20: 3 mL via RESPIRATORY_TRACT

## 2024-03-20 MED ORDER — ACETAMINOPHEN 650 MG RE SUPP: 650.0000 mg | Freq: Four times a day (QID) | RECTAL | Status: DC | PRN

## 2024-03-20 MED ORDER — MORPHINE SULFATE (PF) 4 MG/ML IV SOLN
4.0000 mg | Freq: Once | INTRAVENOUS | Status: AC
  Administered 2024-03-20: 4 mg via INTRAVENOUS
  Filled 2024-03-20: qty 1

## 2024-03-20 MED ORDER — PANTOPRAZOLE SODIUM 40 MG IV SOLR
40.0000 mg | Freq: Two times a day (BID) | INTRAVENOUS | Status: DC
  Administered 2024-03-21 – 2024-03-22 (×3): 40 mg via INTRAVENOUS
  Filled 2024-03-20 (×3): qty 10

## 2024-03-20 MED ORDER — IPRATROPIUM-ALBUTEROL 0.5-2.5 (3) MG/3ML IN SOLN
3.0000 mL | RESPIRATORY_TRACT | Status: DC | PRN
Start: 2024-03-20 — End: 2024-03-22
  Administered 2024-03-21: 3 mL via RESPIRATORY_TRACT
  Filled 2024-03-20: qty 3

## 2024-03-20 MED ORDER — SODIUM CHLORIDE 0.9 % IV SOLN: INTRAVENOUS | Status: DC

## 2024-03-20 MED ORDER — ENOXAPARIN SODIUM 30 MG/0.3ML IJ SOSY
30.0000 mg | PREFILLED_SYRINGE | INTRAMUSCULAR | Status: DC
  Administered 2024-03-20 – 2024-03-21 (×2): 30 mg via SUBCUTANEOUS
  Filled 2024-03-20 (×2): qty 0.3

## 2024-03-20 MED ORDER — PANTOPRAZOLE SODIUM 40 MG IV SOLR
40.0000 mg | INTRAVENOUS | Status: DC
  Administered 2024-03-20: 40 mg via INTRAVENOUS
  Filled 2024-03-20: qty 10

## 2024-03-20 MED ORDER — IPRATROPIUM-ALBUTEROL 0.5-2.5 (3) MG/3ML IN SOLN
RESPIRATORY_TRACT | Status: AC
  Filled 2024-03-20: qty 3

## 2024-03-20 MED ORDER — PIPERACILLIN-TAZOBACTAM 3.375 G IVPB
3.3750 g | Freq: Three times a day (TID) | INTRAVENOUS | Status: DC
  Administered 2024-03-20 – 2024-03-21 (×4): 3.375 g via INTRAVENOUS
  Filled 2024-03-20 (×4): qty 50

## 2024-03-20 MED ORDER — LEVOTHYROXINE SODIUM 25 MCG PO TABS
125.0000 ug | ORAL_TABLET | Freq: Every day | ORAL | Status: DC
  Administered 2024-03-21 – 2024-03-22 (×2): 125 ug via ORAL
  Filled 2024-03-20 (×2): qty 1

## 2024-03-20 MED ORDER — IPRATROPIUM-ALBUTEROL 0.5-2.5 (3) MG/3ML IN SOLN
3.0000 mL | Freq: Once | RESPIRATORY_TRACT | Status: AC
  Administered 2024-03-20: 3 mL via RESPIRATORY_TRACT
  Filled 2024-03-20: qty 3

## 2024-03-20 MED ORDER — MORPHINE SULFATE (PF) 2 MG/ML IV SOLN: 2.0000 mg | INTRAVENOUS | Status: DC | PRN

## 2024-03-20 NOTE — Care Management Obs Status (Signed)
 MEDICARE OBSERVATION STATUS NOTIFICATION   Patient Details  Name: Kelly Mooney MRN: [id number] Date of Birth: July 28, 1946   Medicare Observation Status Notification Given:       Janith Melnick 03/20/2024, 3:23 PM

## 2024-03-20 NOTE — Progress Notes (Signed)
RN gave patient nebulizer treatment. ?

## 2024-03-20 NOTE — H&P (Signed)
 History and Physical    Patient: Kelly Mooney ZOX:[id number] DOB: 03/18/1946 DOA: 03/19/2024 DOS: the patient was seen and examined on 03/20/2024 PCP: Kelly Mould, MD  Patient coming from: Home  Chief Complaint:  Chief Complaint  Patient presents with   Chest Pain   HPI: Kelly Mooney is a 78 y.o. female with medical history significant of hypertension, hyperlipidemia, COPD, diabetes mellitus type 2, and GERD presents with complaints of chest pain and vomiting.  Yesterday around 3 PM, she experienced a sudden onset of severe chest pain accompanied by chest spasms and vomiting. She vomited a total of three times. Initially, she thought it was indigestion and took two Tums, which did not alleviate her symptoms.  Due to a family history of heart attacks and deaths, she decided to seek medical attention and went to the ER in [facility]. A CT scan was performed, revealing inflammation of the esophagus.  She has not eaten or drunk anything since Saturday at 6 PM. Currently, the chest pain is manageable, with some residual tightness and soreness in the center of her chest, but it is not as severe as it was yesterday.  In the emergency department patient was noted to be afebrile, blood pressures elevated up to 167/67, and all other vital signs maintained.  Labs significant for WBC 10.9, hemoglobin 11.8, BUN 31,  and creatinine 1.47.  Chest x-ray showed no acute abnormality with changes consistent with COPD. CT scan of the chest abdomen pelvis without contrast have been obtained which revealed large hiatal hernia containing the proximal stomach, wall thickening of the distal esophagus and gastroesophageal junction with a small amount of free fluid surrounding the distal esophagus worrisome for esophagitis.  Case have been discussed with cardiothoracic surgery who recommended upper GI series.  Patient had been given morphine IV, Zofran , DuoNeb breathing treatment, and  Zosyn.  Review of  Systems: As mentioned in the history of present illness. All other systems reviewed and are negative. Past Medical History:  Diagnosis Date   Agatston coronary artery calcium  score greater than 400    462 on coronary calcium  score 09/2021   Arthritis    Asthma    since age 89   Carotid artery stenosis    1-39% bilateral carotid stenosis by dopplers 04/2023   Cataract    Colon polyps    Complication of anesthesia    slow to wake up    COPD (chronic obstructive pulmonary disease) (HCC)    COVID-19 09/2019   Diabetes mellitus    Diverticulosis    Dyspnea    with exertoin due to asthma    Esophageal stricture    Family history of adverse reaction to anesthesia    mother , daughter and son slow to wake up and  problems with N/V    GERD (gastroesophageal reflux disease)    Hemorrhoids    Hiatal hernia    History of kidney stones    Hypercholesteremia    Hypertension    Hypothyroidism    IBS (irritable bowel syndrome)    Paraesophageal hernia    PONV (postoperative nausea and vomiting)    Stroke (HCC)    no deficits    Tubular adenoma of colon    Past Surgical History:  Procedure Laterality Date   ABDOMINAL HYSTERECTOMY     BUNIONECTOMY Right 10/2012   CATARACT EXTRACTION, BILATERAL     CHOLECYSTECTOMY  1980   ESOPHAGEAL MANOMETRY N/A 07/15/2015   Procedure: ESOPHAGEAL MANOMETRY (EM);  Surgeon: Kelly Legions  Thena Fireman, MD;  Location: [facility];  Service: Endoscopy;  Laterality: N/A;   HEMORRHOID SURGERY N/A 08/01/2020   Procedure: HEMORRHOIDECTOMY, HEMORRHOIDAL LIGATION /PEXY ANORECTAL EXAMINATION UNDER ANESTHESIA;  Surgeon: Kelly Champagne, MD;  Location: [facility];  Service: General;  Laterality: N/A;  LOCAL   PARTIAL HYSTERECTOMY     RHINOPLASTY     nasal polyps removed dr. Delice Mooney   Social History:  reports that she quit smoking about 27 years ago. Her smoking use included cigarettes. She started smoking about 37 years ago. She has a 15 pack-year smoking history. She has never used  smokeless tobacco. She reports that she does not drink alcohol  and does not use drugs.  Allergies  Allergen Reactions   Chloramphenicol Anaphylaxis   Ivp Dye [Iodinated Contrast Media] Anaphylaxis, Shortness Of Breath and Other (See Comments)    SEVERE convulsions, too    Crestor  [Rosuvastatin  Calcium ]     Muscle cramps   Iodine Itching   Sulfa Antibiotics Rash    Family History  Problem Relation Age of Onset   Diabetes Mellitus I Mother    Hyperlipidemia Mother    Asthma Mother    Heart attack Father    Heart attack Brother    Asthma Brother    Heart attack Brother    Colon cancer Neg Hx    Colon polyps Neg Hx    Esophageal cancer Neg Hx    Rectal cancer Neg Hx    Stomach cancer Neg Hx     Prior to Admission medications   Medication Sig Start Date End Date Taking? Authorizing Provider  albuterol  (PROVENTIL  HFA;VENTOLIN  HFA) 108 (90 BASE) MCG/ACT inhaler Inhale 2 puffs into the lungs 4 (four) times daily.     [provider]  albuterol  (PROVENTIL ) (2.5 MG/3ML) 0.083% nebulizer solution Take 2.5 mg by nebulization every 6 (six) hours as needed. 02/03/21   [provider]  amLODipine  (NORVASC ) 10 MG tablet Take 10 mg by mouth daily with lunch.    [provider]  aspirin  EC 81 MG tablet Take 1 tablet (81 mg total) by mouth daily. Swallow whole. 10/15/21   Kelly Matsu, MD  atorvastatin  (LIPITOR) 40 MG tablet Take 1 tablet (40 mg total) by mouth daily. 11/04/22   Kelly Matsu, MD  benzonatate  (TESSALON ) 100 MG capsule Take 1 capsule (100 mg total) by mouth every 6 (six) hours as needed for cough. 01/11/23   Kelly Baumgarten, NP  Budeson-Glycopyrrol-Formoterol  (BREZTRI  AEROSPHERE) 160-9-4.8 MCG/ACT AERO INHALE 2 PUFFS BY MOUTH INTO THE LUNGS IN THE MORNING AND AT BEDTIME 11/18/23   Kelly, Praveen, MD  chlorthalidone (HYGROTON) 25 MG tablet Take 25 mg by mouth daily.  09/18/19   [provider]  clobetasol cream (TEMOVATE) 0.05 % Apply 1  application topically daily. Apply a thin layer to affected areas 02/17/16   [provider]  fenofibrate  160 MG tablet Take 160 mg by mouth daily. 07/27/19   [provider]  glipiZIDE (GLUCOTROL) 5 MG tablet Take 5 mg by mouth 2 (two) times daily. 11/12/20   [provider]  hydrOXYzine (ATARAX/VISTARIL) 25 MG tablet Take 25 mg by mouth every 8 (eight) hours as needed. 12/25/20   [provider]  irbesartan (AVAPRO) 150 MG tablet Take 150 mg by mouth daily. 11/19/21   [provider]  levothyroxine  (SYNTHROID ) 125 MCG tablet Take 125 mcg by mouth daily before breakfast.    [provider]  Magnesium Oxide -Mg Supplement 200 MG TABS Take 400  mg by mouth daily.    [provider]  mometasone  (ELOCON ) 0.1 % cream Apply 1 application topically every other day.  02/27/20   [provider]  montelukast  (SINGULAIR ) 10 MG tablet Take 10 mg by mouth daily.  03/21/15   [provider]  pantoprazole  (PROTONIX ) 40 MG tablet Take 1 tablet (40 mg total) by mouth daily. 02/21/24   Tobin Forts, MD  Soft Lens Products (B & L SENSITIVE EYES SALINE) 0.4 % SOLN Place 1 drop into both eyes daily as needed (Dry eye).    [provider]  valACYclovir (VALTREX) 1000 MG tablet Take 2 tablets by mouth as needed (Cold Sore). 11/28/19   [provider]    Physical Exam: Vitals:   03/20/24 0700 03/20/24 0931 03/20/24 1145 03/20/24 1200  BP: (!) 111/44     Pulse: 64  78 69  Resp: 13  18 19   Temp:  98.4 F (36.9 C)    TempSrc:  Oral    SpO2: 97%  100% 98%  Weight:      Height:       Constitutional: Elderly female who appears to be in some discomfort Eyes: PERRL, lids and conjunctivae normal ENMT: Mucous membranes are moist. Posterior pharynx clear of any exudate or lesions.Normal dentition.  Neck: normal, supple, no masses, no thyromegaly Respiratory: clear to auscultation bilaterally, no wheezing, no crackles. Normal  respiratory effort. No accessory muscle use.  Cardiovascular: Regular rate and rhythm, no murmurs / rubs / gallops. No extremity edema. 2+ pedal pulses. No carotid bruits.  Abdomen: no tenderness, no masses palpated. No hepatosplenomegaly. Bowel sounds positive.  Musculoskeletal: no clubbing / cyanosis. No joint deformity upper and lower extremities. Good ROM, no contractures. Normal muscle tone.  Skin: no rashes, lesions, ulcers. No induration Neurologic: CN 2-12 grossly intact. Sensation intact, DTR normal. Strength 5/5 in all 4.  Psychiatric: Normal judgment and insight. Alert and oriented x 3. Normal mood.   Data Reviewed:  EKG reveals sinus rhythm at 96 bpm with low voltage in the precordial leads.  Reviewed labs, imaging, and pertinent records as documented.  Assessment and Plan:  Chest pain Elevated troponin Esophagitis Patient presents with acute onset of severe chest pain with reports of nausea and vomiting.  High-sensitivity troponin noted to be elevated but trending down 30->27.  EKG without significant ischemic changes.  CT imaging concerning for wall thickening of the distal esophagus and gastroesophageal junction with a small amount of free fluid surrounding the distal esophagus concerning for esophagitis without signs of pneumoperitoneum.  However CT surgery was consulted due to the severity of patient's symptoms and recommended upper GI series to rule out the possibility of small perforation.  Suspecting symptoms likely noncardiac - Admit to a cardiac telemetry bed - Aspiration precautions with elevation head of bed - Check upper GI series - Protonix  IV twice daily - Check echocardiogram  Leukocytosis Resolved.  Initial white blood cell count elevated at 10.9.  Suspect symptoms could be reactive to above.  Repeat WBC noted to be within normal limits at 8. - Continue to monitor  Acute kidney injury superimposed on chronic kidney disease stage IIIb Creatinine elevated from  1.47 to 2 with BUN 37 today.  Baseline creatinine previously noted to be around 1.24-1.57 when checked last year.  Suspect acute elevation secondary to poor p.o. intake with reports of nausea and vomiting. - Check urinalysis - Avoid nephrotoxic agents - Normal saline at 75 mL/h - Recheck kidney function in a.m.  Essential hypertension Blood pressures were noted to be soft today 111/44 to 113/51. - Continue to monitor and determine when medically appropriate to resume  Normocytic anemia Chronic.  Hemoglobin noted to be 11.8->11 which appears around patient's baseline.  No report of bleeding. - Recheck H&H morning  Diabetes mellitus type 2, without long-term use of insulin  On admission glucose noted to be 130.  Last available hemoglobin A1c noted to be 5.8 when checked 3 months ago.  She may no longer being on medications for treatment.  Hypothyroidism TSH last noted to be 0.42 when checked 09/24/2023 - Continue levothyroxine    DVT prophylaxis: Lovenox  Advance Care Planning:   Code Status: Full Code    Consults: None  Family Communication: None  Severity of Illness: The appropriate patient status for this patient is OBSERVATION. Observation status is judged to be reasonable and necessary in order to provide the required intensity of service to ensure the patient's safety. The patient's presenting symptoms, physical exam findings, and initial radiographic and laboratory data in the context of their medical condition is Mooney to place them at decreased risk for further clinical deterioration. Furthermore, it is anticipated that the patient will be medically stable for discharge from the hospital within 2 midnights of admission.   Author: Lena Qualia, MD 03/20/2024 1:42 PM  For on call review [email or web address].

## 2024-03-20 NOTE — ED Notes (Signed)
 Called [facility] for Transport to [facility] @8 :42am.  Spoke with Mariah Shines

## 2024-03-20 NOTE — H&P (Incomplete Revision)
 History and Physical    Patient: Kelly Mooney ZOX:[id number] DOB: 03/18/1946 DOA: 03/19/2024 DOS: the patient was seen and examined on 03/20/2024 PCP: Jimmey Mould, MD  Patient coming from: Home  Chief Complaint:  Chief Complaint  Patient presents with   Chest Pain   HPI: Kelly Mooney is a 78 y.o. female with medical history significant of hypertension, hyperlipidemia, COPD, diabetes mellitus type 2, and GERD presents with complaints of chest pain and vomiting.  Yesterday around 3 PM, she experienced a sudden onset of severe chest pain accompanied by chest spasms and vomiting. She vomited a total of three times. Initially, she thought it was indigestion and took two Tums, which did not alleviate her symptoms.  Due to a family history of heart attacks and deaths, she decided to seek medical attention and went to the ER in [facility]. A CT scan was performed, revealing inflammation of the esophagus.  She has not eaten or drunk anything since Saturday at 6 PM. Currently, the chest pain is manageable, with some residual tightness and soreness in the center of her chest, but it is not as severe as it was yesterday.  In the emergency department patient was noted to be afebrile, blood pressures elevated up to 167/67, and all other vital signs maintained.  Labs significant for WBC 10.9, hemoglobin 11.8, BUN 31,  and creatinine 1.47.  Chest x-ray showed no acute abnormality with changes consistent with COPD. CT scan of the chest abdomen pelvis without contrast have been obtained which revealed large hiatal hernia containing the proximal stomach, wall thickening of the distal esophagus and gastroesophageal junction with a small amount of free fluid surrounding the distal esophagus worrisome for esophagitis.  Case have been discussed with cardiothoracic surgery who recommended upper GI series.  Patient had been given morphine IV, Zofran , DuoNeb breathing treatment, and  Zosyn.  Review of  Systems: As mentioned in the history of present illness. All other systems reviewed and are negative. Past Medical History:  Diagnosis Date   Agatston coronary artery calcium  score greater than 400    462 on coronary calcium  score 09/2021   Arthritis    Asthma    since age 89   Carotid artery stenosis    1-39% bilateral carotid stenosis by dopplers 04/2023   Cataract    Colon polyps    Complication of anesthesia    slow to wake up    COPD (chronic obstructive pulmonary disease) (HCC)    COVID-19 09/2019   Diabetes mellitus    Diverticulosis    Dyspnea    with exertoin due to asthma    Esophageal stricture    Family history of adverse reaction to anesthesia    mother , daughter and son slow to wake up and  problems with N/V    GERD (gastroesophageal reflux disease)    Hemorrhoids    Hiatal hernia    History of kidney stones    Hypercholesteremia    Hypertension    Hypothyroidism    IBS (irritable bowel syndrome)    Paraesophageal hernia    PONV (postoperative nausea and vomiting)    Stroke (HCC)    no deficits    Tubular adenoma of colon    Past Surgical History:  Procedure Laterality Date   ABDOMINAL HYSTERECTOMY     BUNIONECTOMY Right 10/2012   CATARACT EXTRACTION, BILATERAL     CHOLECYSTECTOMY  1980   ESOPHAGEAL MANOMETRY N/A 07/15/2015   Procedure: ESOPHAGEAL MANOMETRY (EM);  Surgeon: Autry Legions  Thena Fireman, MD;  Location: [facility];  Service: Endoscopy;  Laterality: N/A;   HEMORRHOID SURGERY N/A 08/01/2020   Procedure: HEMORRHOIDECTOMY, HEMORRHOIDAL LIGATION /PEXY ANORECTAL EXAMINATION UNDER ANESTHESIA;  Surgeon: Candyce Champagne, MD;  Location: [facility];  Service: General;  Laterality: N/A;  LOCAL   PARTIAL HYSTERECTOMY     RHINOPLASTY     nasal polyps removed dr. Delice Felt   Social History:  reports that she quit smoking about 27 years ago. Her smoking use included cigarettes. She started smoking about 37 years ago. She has a 15 pack-year smoking history. She has never used  smokeless tobacco. She reports that she does not drink alcohol  and does not use drugs.  Allergies  Allergen Reactions   Chloramphenicol Anaphylaxis   Ivp Dye [Iodinated Contrast Media] Anaphylaxis, Shortness Of Breath and Other (See Comments)    SEVERE convulsions, too    Crestor  [Rosuvastatin  Calcium ]     Muscle cramps   Iodine Itching   Sulfa Antibiotics Rash    Family History  Problem Relation Age of Onset   Diabetes Mellitus I Mother    Hyperlipidemia Mother    Asthma Mother    Heart attack Father    Heart attack Brother    Asthma Brother    Heart attack Brother    Colon cancer Neg Hx    Colon polyps Neg Hx    Esophageal cancer Neg Hx    Rectal cancer Neg Hx    Stomach cancer Neg Hx     Prior to Admission medications   Medication Sig Start Date End Date Taking? Authorizing Provider  albuterol  (PROVENTIL  HFA;VENTOLIN  HFA) 108 (90 BASE) MCG/ACT inhaler Inhale 2 puffs into the lungs 4 (four) times daily.     [provider]  albuterol  (PROVENTIL ) (2.5 MG/3ML) 0.083% nebulizer solution Take 2.5 mg by nebulization every 6 (six) hours as needed. 02/03/21   [provider]  amLODipine  (NORVASC ) 10 MG tablet Take 10 mg by mouth daily with lunch.    [provider]  aspirin  EC 81 MG tablet Take 1 tablet (81 mg total) by mouth daily. Swallow whole. 10/15/21   Jacqueline Matsu, MD  atorvastatin  (LIPITOR) 40 MG tablet Take 1 tablet (40 mg total) by mouth daily. 11/04/22   Jacqueline Matsu, MD  benzonatate  (TESSALON ) 100 MG capsule Take 1 capsule (100 mg total) by mouth every 6 (six) hours as needed for cough. 01/11/23   Antonio Baumgarten, NP  Budeson-Glycopyrrol-Formoterol  (BREZTRI  AEROSPHERE) 160-9-4.8 MCG/ACT AERO INHALE 2 PUFFS BY MOUTH INTO THE LUNGS IN THE MORNING AND AT BEDTIME 11/18/23   Mannam, Praveen, MD  chlorthalidone (HYGROTON) 25 MG tablet Take 25 mg by mouth daily.  09/18/19   [provider]  clobetasol cream (TEMOVATE) 0.05 % Apply 1  application topically daily. Apply a thin layer to affected areas 02/17/16   [provider]  fenofibrate  160 MG tablet Take 160 mg by mouth daily. 07/27/19   [provider]  glipiZIDE (GLUCOTROL) 5 MG tablet Take 5 mg by mouth 2 (two) times daily. 11/12/20   [provider]  hydrOXYzine (ATARAX/VISTARIL) 25 MG tablet Take 25 mg by mouth every 8 (eight) hours as needed. 12/25/20   [provider]  irbesartan (AVAPRO) 150 MG tablet Take 150 mg by mouth daily. 11/19/21   [provider]  levothyroxine  (SYNTHROID ) 125 MCG tablet Take 125 mcg by mouth daily before breakfast.    [provider]  Magnesium Oxide -Mg Supplement 200 MG TABS Take 400  mg by mouth daily.    [provider]  mometasone  (ELOCON ) 0.1 % cream Apply 1 application topically every other day.  02/27/20   [provider]  montelukast  (SINGULAIR ) 10 MG tablet Take 10 mg by mouth daily.  03/21/15   [provider]  pantoprazole  (PROTONIX ) 40 MG tablet Take 1 tablet (40 mg total) by mouth daily. 02/21/24   Tobin Forts, MD  Soft Lens Products (B & L SENSITIVE EYES SALINE) 0.4 % SOLN Place 1 drop into both eyes daily as needed (Dry eye).    [provider]  valACYclovir (VALTREX) 1000 MG tablet Take 2 tablets by mouth as needed (Cold Sore). 11/28/19   [provider]    Physical Exam: Vitals:   03/20/24 0700 03/20/24 0931 03/20/24 1145 03/20/24 1200  BP: (!) 111/44     Pulse: 64  78 69  Resp: 13  18 19   Temp:  98.4 F (36.9 C)    TempSrc:  Oral    SpO2: 97%  100% 98%  Weight:      Height:       Constitutional: Elderly female who appears to be in some discomfort Eyes: PERRL, lids and conjunctivae normal ENMT: Mucous membranes are moist. Posterior pharynx clear of any exudate or lesions.Normal dentition.  Neck: normal, supple, no masses, no thyromegaly Respiratory: clear to auscultation bilaterally, no wheezing, no crackles. Normal  respiratory effort. No accessory muscle use.  Cardiovascular: Regular rate and rhythm, no murmurs / rubs / gallops. No extremity edema. 2+ pedal pulses. No carotid bruits.  Abdomen: no tenderness, no masses palpated. No hepatosplenomegaly. Bowel sounds positive.  Musculoskeletal: no clubbing / cyanosis. No joint deformity upper and lower extremities. Good ROM, no contractures. Normal muscle tone.  Skin: no rashes, lesions, ulcers. No induration Neurologic: CN 2-12 grossly intact. Sensation intact, DTR normal. Strength 5/5 in all 4.  Psychiatric: Normal judgment and insight. Alert and oriented x 3. Normal mood.   Data Reviewed:  EKG reveals sinus rhythm at 96 bpm with low voltage in the precordial leads.  Reviewed labs, imaging, and pertinent records as documented.  Assessment and Plan:  Chest pain Elevated troponin Esophagitis Patient presents with acute onset of severe chest pain with reports of nausea and vomiting.  High-sensitivity troponin noted to be elevated but trending down 30->27.  EKG without significant ischemic changes.  CT imaging concerning for wall thickening of the distal esophagus and gastroesophageal junction with a small amount of free fluid surrounding the distal esophagus concerning for esophagitis without signs of pneumoperitoneum.  However CT surgery was consulted due to the severity of patient's symptoms and recommended upper GI series to rule out the possibility of small perforation.  Suspecting symptoms likely noncardiac - Admit to a cardiac telemetry bed - Aspiration precautions with elevation head of bed - Check upper GI series - Protonix  IV twice daily - Check echocardiogram  Leukocytosis Resolved.  Initial white blood cell count elevated at 10.9.  Suspect symptoms could be reactive to above.  Repeat WBC noted to be within normal limits at 8. - Continue to monitor  Acute kidney injury superimposed on chronic kidney disease stage IIIb Creatinine elevated from  1.47 to 2 with BUN 37 today.  Baseline creatinine previously noted to be around 1.24-1.57 when checked last year.  Suspect acute elevation secondary to poor p.o. intake with reports of nausea and vomiting. - Check urinalysis - Avoid nephrotoxic agents - Normal saline at 75 mL/h - Recheck kidney function in a.m.  Essential hypertension Blood pressures were noted to be soft today 111/44 to 113/51. - Continue to monitor and determine when medically appropriate to resume  Normocytic anemia Chronic.  Hemoglobin noted to be 11.8->11 which appears around patient's baseline.  No report of bleeding. - Recheck H&H morning  Diabetes mellitus type 2, without long-term use of insulin  On admission glucose noted to be 130.  Last available hemoglobin A1c noted to be 5.8 when checked 3 months ago.  She may no longer being on medications for treatment.  Hypothyroidism TSH last noted to be 0.42 when checked 09/24/2023 - Continue levothyroxine    DVT prophylaxis: Lovenox  Advance Care Planning:   Code Status: Full Code    Consults: None  Family Communication: None  Severity of Illness: The appropriate patient status for this patient is OBSERVATION. Observation status is judged to be reasonable and necessary in order to provide the required intensity of service to ensure the patient's safety. The patient's presenting symptoms, physical exam findings, and initial radiographic and laboratory data in the context of their medical condition is felt to place them at decreased risk for further clinical deterioration. Furthermore, it is anticipated that the patient will be medically stable for discharge from the hospital within 2 midnights of admission.   Author: Lena Qualia, MD 03/20/2024 1:42 PM  For on call review [email or web address].

## 2024-03-20 NOTE — Plan of Care (Signed)

## 2024-03-21 ENCOUNTER — Telehealth: Payer: Self-pay | Admitting: *Deleted

## 2024-03-21 ENCOUNTER — Observation Stay (HOSPITAL_COMMUNITY)

## 2024-03-21 DIAGNOSIS — N179 Acute kidney failure, unspecified: Secondary | ICD-10-CM | POA: Diagnosis not present

## 2024-03-21 DIAGNOSIS — K449 Diaphragmatic hernia without obstruction or gangrene: Secondary | ICD-10-CM | POA: Diagnosis not present

## 2024-03-21 DIAGNOSIS — N1832 Chronic kidney disease, stage 3b: Secondary | ICD-10-CM | POA: Diagnosis not present

## 2024-03-21 DIAGNOSIS — N183 Chronic kidney disease, stage 3 unspecified: Secondary | ICD-10-CM

## 2024-03-21 DIAGNOSIS — K2100 Gastro-esophageal reflux disease with esophagitis, without bleeding: Secondary | ICD-10-CM | POA: Diagnosis not present

## 2024-03-21 DIAGNOSIS — R112 Nausea with vomiting, unspecified: Secondary | ICD-10-CM

## 2024-03-21 DIAGNOSIS — R079 Chest pain, unspecified: Secondary | ICD-10-CM

## 2024-03-21 DIAGNOSIS — R7989 Other specified abnormal findings of blood chemistry: Secondary | ICD-10-CM | POA: Diagnosis not present

## 2024-03-21 DIAGNOSIS — K222 Esophageal obstruction: Secondary | ICD-10-CM

## 2024-03-21 LAB — BASIC METABOLIC PANEL WITH GFR
Anion gap: 9 (ref 5–15)
BUN: 34 mg/dL — ABNORMAL HIGH (ref 8–23)
CO2: 24 mmol/L (ref 22–32)
Calcium: 8.5 mg/dL — ABNORMAL LOW (ref 8.9–10.3)
Chloride: 109 mmol/L (ref 98–111)
Creatinine, Ser: 1.63 mg/dL — ABNORMAL HIGH (ref 0.44–1.00)
GFR, Estimated: 32 mL/min — ABNORMAL LOW (ref >60)
Glucose, Bld: 111 mg/dL — ABNORMAL HIGH (ref 70–99)
Potassium: 4 mmol/L (ref 3.5–5.1)
Sodium: 142 mmol/L (ref 135–145)

## 2024-03-21 LAB — ECHOCARDIOGRAM COMPLETE
Area-P 1/2: 3.17 cm2
Height: 60 in
S' Lateral: 3 cm
Weight: 2433.88 [oz_av]

## 2024-03-21 LAB — CBC
HCT: 30.9 % — ABNORMAL LOW (ref 36.0–46.0)
Hemoglobin: 10.1 g/dL — ABNORMAL LOW (ref 12.0–15.0)
MCH: 29.3 pg (ref 26.0–34.0)
MCHC: 32.7 g/dL (ref 30.0–36.0)
MCV: 89.6 fL (ref 80.0–100.0)
Platelets: 190 10*3/uL (ref 150–400)
RBC: 3.45 MIL/uL — ABNORMAL LOW (ref 3.87–5.11)
RDW: 13.5 % (ref 11.5–15.5)
WBC: 6.2 10*3/uL (ref 4.0–10.5)
nRBC: 0 % (ref 0.0–0.2)

## 2024-03-21 LAB — GLUCOSE, CAPILLARY
Glucose-Capillary: 153 mg/dL — ABNORMAL HIGH (ref 70–99)
Glucose-Capillary: 146 mg/dL — ABNORMAL HIGH (ref 70–99)

## 2024-03-21 LAB — TROPONIN I (HIGH SENSITIVITY)
Troponin I (High Sensitivity): 8 ng/L (ref <18)
Troponin I (High Sensitivity): 7 ng/L (ref <18)

## 2024-03-21 MED ORDER — IPRATROPIUM-ALBUTEROL 0.5-2.5 (3) MG/3ML IN SOLN
3.0000 mL | Freq: Two times a day (BID) | RESPIRATORY_TRACT | Status: DC
  Administered 2024-03-21: 3 mL via RESPIRATORY_TRACT
  Filled 2024-03-21 (×2): qty 3

## 2024-03-21 NOTE — Plan of Care (Signed)

## 2024-03-21 NOTE — Consult Note (Signed)
 Consultation  Referring Provider:    Dr. Janece Means Primary Care Physician:  Jimmey Mould, MD Primary Gastroenterologist:     Dr. Elvin Hammer    Reason for Consultation:     Chest pain, abnormal imaging         HPI:   Kelly Mooney is a 78 y.o. female with a history of HTN, HLD, COPD, diabetes, GERD, hiatal hernia, presenting with chest pain and nausea/vomiting.  She reports relatively acute onset chest pain in the afternoon on 03/19/2024, described as a feeling of spasm, followed immediately by 3 episodes of nonbloody emesis.  Has not had a nausea/vomiting since then.  No prior similar episodes.  No improvement in the pain/spasm with Tums.  Admission evaluation notable for the following: - CT chest: Thickening of the distal esophagus/GE junction, small amount of free fluid surrounding the distal esophagus, large hiatal hernia - Esophagram: Large hiatal hernia, tortuous lower third of the esophagus, stricture at the GE junction, but no evidence of perforation/extravasation - WBC 8.0, H/H 11/34 --> 10/31 - BUN/creatinine 37/2.0--> 34/1.6 - Troponin negative/normal - Lipase normal, liver enzymes normal - TTE: EF 60-65%, grade 1 diastolic dysfunction, otherwise normal  Again, no emesis since coming to the ER on 5/4.  Pain overall much improving and now described as a discomfort and tenderness in the precordium.  No dysphagia, odynophagia.  She has a known longstanding history of GERD complicated by large hiatal hernia and peptic stricture, requiring esophageal dilation in the past.  Most recent EGD was 10/2023 with large hiatal hernia, stricture at the GE junction dilated with 20 mm TTS balloon.  Prior to that, EGD in 05/2020 with stricture in the distal esophagus, again dilated with 20 mm TTS balloon, along with large hiatal hernia.  Reflux otherwise had been largely well-controlled with Protonix  40 mg daily.  Esophageal manometry 2016 with normal motility.   Past Medical History:   Diagnosis Date   Agatston coronary artery calcium  score greater than 400    462 on coronary calcium  score 09/2021   Arthritis    Asthma    since age 36   Carotid artery stenosis    1-39% bilateral carotid stenosis by dopplers 04/2023   Cataract    Colon polyps    Complication of anesthesia    slow to wake up    COPD (chronic obstructive pulmonary disease) (HCC)    COVID-19 09/2019   Diabetes mellitus    Diverticulosis    Dyspnea    with exertoin due to asthma    Esophageal stricture    Family history of adverse reaction to anesthesia    mother , daughter and son slow to wake up and  problems with N/V    GERD (gastroesophageal reflux disease)    Hemorrhoids    Hiatal hernia    History of kidney stones    Hypercholesteremia    Hypertension    Hypothyroidism    IBS (irritable bowel syndrome)    Paraesophageal hernia    PONV (postoperative nausea and vomiting)    Stroke (HCC)    no deficits    Tubular adenoma of colon     Past Surgical History:  Procedure Laterality Date   ABDOMINAL HYSTERECTOMY     BUNIONECTOMY Right 10/2012   CATARACT EXTRACTION, BILATERAL     CHOLECYSTECTOMY  1980   ESOPHAGEAL MANOMETRY N/A 07/15/2015   Procedure: ESOPHAGEAL MANOMETRY (EM);  Surgeon: Tobin Forts, MD;  Location: [facility];  Service: Endoscopy;  Laterality: N/A;  HEMORRHOID SURGERY N/A 08/01/2020   Procedure: HEMORRHOIDECTOMY, HEMORRHOIDAL LIGATION /PEXY ANORECTAL EXAMINATION UNDER ANESTHESIA;  Surgeon: Candyce Champagne, MD;  Location: [facility];  Service: General;  Laterality: N/A;  LOCAL   PARTIAL HYSTERECTOMY     RHINOPLASTY     nasal polyps removed dr. Delice Felt    Family History  Problem Relation Age of Onset   Diabetes Mellitus I Mother    Hyperlipidemia Mother    Asthma Mother    Heart attack Father    Heart attack Brother    Asthma Brother    Heart attack Brother    Colon cancer Neg Hx    Colon polyps Neg Hx    Esophageal cancer Neg Hx    Rectal cancer Neg Hx    Stomach  cancer Neg Hx      Social History   Tobacco Use   Smoking status: Former    Current packs/day: 0.00    Average packs/day: 1.5 packs/day for 10.0 years (15.0 ttl pk-yrs)    Types: Cigarettes    Start date: 10/02/1986    Quit date: 10/02/1996    Years since quitting: 27.4   Smokeless tobacco: Never   Tobacco comments:    quit 1997  Vaping Use   Vaping status: Never Used  Substance Use Topics   Alcohol  use: No    Alcohol /week: 0.0 standard drinks of alcohol    Drug use: No    Prior to Admission medications   Medication Sig Start Date End Date Taking? Authorizing Provider  albuterol  (PROVENTIL  HFA;VENTOLIN  HFA) 108 (90 BASE) MCG/ACT inhaler Inhale 2 puffs into the lungs 4 (four) times daily.    Yes [provider]  albuterol  (PROVENTIL ) (2.5 MG/3ML) 0.083% nebulizer solution Take 2.5 mg by nebulization every 6 (six) hours as needed for wheezing or shortness of breath. 02/03/21  Yes [provider]  aspirin  EC 81 MG tablet Take 1 tablet (81 mg total) by mouth daily. Swallow whole. Patient taking differently: Take 81 mg by mouth every 7 (seven) days. Swallow whole. 10/15/21  Yes Turner, Rufus Council, MD  atorvastatin  (LIPITOR) 40 MG tablet Take 1 tablet (40 mg total) by mouth daily. Patient taking differently: Take 40 mg by mouth at bedtime. 11/04/22  Yes Turner, Rufus Council, MD  benzonatate  (TESSALON ) 100 MG capsule Take 1 capsule (100 mg total) by mouth every 6 (six) hours as needed for cough. 01/11/23  Yes Antonio Baumgarten, NP  Budeson-Glycopyrrol-Formoterol  (BREZTRI  AEROSPHERE) 160-9-4.8 MCG/ACT AERO INHALE 2 PUFFS BY MOUTH INTO THE LUNGS IN THE MORNING AND AT BEDTIME 11/18/23  Yes Mannam, Praveen, MD  carboxymethylcellulose (ARTIFICIAL TEARS) 1 % ophthalmic solution Place 1 drop into both eyes 3 (three) times daily.   Yes [provider]  chlorthalidone (HYGROTON) 25 MG tablet Take 25 mg by mouth daily.  09/18/19  Yes [provider]  clobetasol cream  (TEMOVATE) 0.05 % Apply 1 application  topically See admin instructions. Apply a thin layer to affected areas one to three times a day 02/17/16  Yes [provider]  fenofibrate  160 MG tablet Take 160 mg by mouth daily. 07/27/19  Yes [provider]  hydrOXYzine (ATARAX/VISTARIL) 25 MG tablet Take 25 mg by mouth every 8 (eight) hours as needed for anxiety. 12/25/20  Yes [provider]  irbesartan (AVAPRO) 300 MG tablet Take 300 mg by mouth daily.   Yes [provider]  levothyroxine  (SYNTHROID ) 125 MCG tablet Take 125 mcg by mouth daily before breakfast.   Yes [provider]  magnesium oxide (MAG-OX) 400 (240 Mg) MG tablet Take 400 mg by mouth 2 (two) times daily.   Yes [provider]  montelukast  (SINGULAIR ) 10 MG tablet Take 10 mg by mouth at bedtime. 03/21/15  Yes [provider]  MOUNJARO  2.5 MG/0.5ML Pen Inject 2.5 mg into the skin every Wednesday. 03/07/24  Yes [provider]  pantoprazole  (PROTONIX ) 40 MG tablet Take 1 tablet (40 mg total) by mouth daily. Patient taking differently: Take 40 mg by mouth at bedtime. 02/21/24  Yes Tobin Forts, MD  valACYclovir (VALTREX) 1000 MG tablet Take 2,000 mg by mouth daily as needed (for cold sores- take as directed). 11/28/19  Yes [provider]    Current Facility-Administered Medications  Medication Dose Route Frequency Provider Last Rate Last Admin   0.9 %  sodium chloride  infusion   Intravenous Continuous Lena Qualia, MD 75 mL/hr at 03/21/24 0617 New Bag at 03/21/24 0617   acetaminophen  (TYLENOL ) tablet 650 mg  650 mg Oral Q6H PRN Smith, Rondell A, MD       Or   acetaminophen  (TYLENOL ) suppository 650 mg  650 mg Rectal Q6H PRN Manny Sees A, MD       enoxaparin  (LOVENOX ) injection 30 mg  30 mg Subcutaneous Q24H Smith, Rondell A, MD   30 mg at 03/20/24 2239   ipratropium-albuterol  (DUONEB) 0.5-2.5 (3) MG/3ML nebulizer solution 3 mL  3 mL Nebulization Q4H PRN Manny Sees A, MD       levothyroxine  (SYNTHROID ) tablet 125 mcg  125 mcg Oral QAC breakfast Manny Sees A, MD   125 mcg at 03/21/24 0615   morphine (PF) 2 MG/ML injection 2 mg  2 mg Intravenous Q2H PRN Smith, Rondell A, MD       nitroGLYCERIN (NITROSTAT) SL tablet 0.4 mg  0.4 mg Sublingual Q5 min PRN Scarlette Currier, MD   0.4 mg at 03/21/24 0745   pantoprazole  (PROTONIX ) injection 40 mg  40 mg Intravenous Q12H Smith, Rondell A, MD   40 mg at 03/21/24 0804   piperacillin-tazobactam (ZOSYN) IVPB 3.375 g  3.375 g Intravenous Q8H Hall, Carole N, DO 12.5 mL/hr at 03/21/24 0803 3.375 g at 03/21/24 1610   sodium chloride  flush (NS) 0.9 % injection 3 mL  3 mL Intravenous Q12H Manny Sees A, MD   3 mL at 03/21/24 0809    Allergies as of 03/19/2024 - Review Complete 03/19/2024  Allergen Reaction Noted   Chloramphenicol Anaphylaxis 07/05/2018   Ivp dye [iodinated contrast media] Anaphylaxis, Shortness Of Breath, and Other (See Comments) 06/14/2012   Crestor  [rosuvastatin  calcium ]  08/04/2018   Iodine Itching 06/12/2015   Sulfa antibiotics Rash 07/05/2018     Review of Systems:    As per HPI, otherwise negative    Physical Exam:  Vital signs in last 24 hours: Temp:  [97.8 F (36.6 C)-99.5 F (37.5 C)] 98.3 F (36.8 C) (05/06 0750) Pulse Rate:  [69-96] 88 (05/06 0750) Resp:  [17-19] 17 (05/06 0750) BP: (113-140)/(45-57) 140/53 (05/06 0750) SpO2:  [96 %-100 %] 96 % (05/06 0750) Weight:  [69 kg] 69 kg (05/05 1355) Last BM Date : 03/20/24 General:   Pleasant female in NAD Lungs:  Respirations even and unlabored. Lungs clear to auscultation bilaterally.   No wheezes, crackles, or rhonchi.  Heart:  Regular rate and rhythm; no MRG Abdomen:  Soft, nondistended, nontender. Normal bowel sounds. No appreciable masses or hepatomegaly.  Msk: Mild discomfort with sternal palpation.   Extremities:  Without edema. Neurologic:  Alert and  oriented x4 Psych:  Alert and cooperative. Normal  affect.  LAB RESULTS: Recent Labs    03/19/24 1634 03/20/24 1401 03/21/24 0501  WBC 10.9* 8.0 6.2  HGB 11.8* 11.0* 10.1*  HCT 35.1* 34.1* 30.9*  PLT 217 219 190   BMET Recent Labs    03/19/24 1634 03/20/24 1401 03/21/24 0501  NA 143 144 142  K 3.9 4.6 4.0  CL 107 109 109  CO2 20* 24 24  GLUCOSE 133* 100* 111*  BUN 31* 37* 34*  CREATININE 1.47* 2.00* 1.63*  CALCIUM  10.1 9.5 8.5*   LFT Recent Labs    03/19/24 1634  PROT 6.5  ALBUMIN 4.2  AST 21  ALT 15  ALKPHOS 39  BILITOT 0.6   PT/INR No results for input(s): "LABPROT", "INR" in the last 72 hours.  STUDIES: ECHOCARDIOGRAM COMPLETE Result Date: 03/21/2024    ECHOCARDIOGRAM REPORT   Patient Name:   EASTHER GALLEY Date of Exam: 03/21/2024 Medical Rec #:  [id number]        Height:       60.0 in Accession #:    [id number]       Weight:       152.1 lb Date of Birth:  12/27/1945         BSA:          1.662 m Patient Age:    77 years         BP:           140/53 mmHg Patient Gender: F                HR:           82 bpm. Exam Location:  Inpatient Procedure: 2D Echo, Color Doppler and Cardiac Doppler (Both Spectral and Color            Flow Doppler were utilized during procedure). Indications:    chest pain  History:        Patient has prior history of Echocardiogram examinations, most                 recent 12/07/2022.  Sonographer:    Janette Medley Referring Phys: [id number] RONDELL A SMITH IMPRESSIONS  1. Left ventricular ejection fraction, by estimation, is 60 to 65%. The left ventricle has normal function. The left ventricle has no regional wall motion abnormalities. Left ventricular diastolic parameters are consistent with Grade I diastolic dysfunction (impaired relaxation).  2. Right ventricular systolic function is normal. The right ventricular size is normal.  3. The mitral valve is normal in structure. No evidence of mitral valve regurgitation. No evidence of mitral stenosis. Moderate mitral annular calcification.  4. The aortic  valve is normal in structure. Aortic valve regurgitation is not visualized. No aortic stenosis is present.  5. The inferior vena cava is normal in size with greater than 50% respiratory variability, suggesting right atrial pressure of 3 mmHg. FINDINGS  Left Ventricle: Left ventricular ejection fraction, by estimation, is 60 to 65%. The left ventricle has normal function. The left ventricle has no regional wall motion abnormalities. The left ventricular internal cavity size was normal in size. There is  no left ventricular hypertrophy. Left ventricular diastolic parameters are consistent with Grade I diastolic dysfunction (impaired relaxation). Right Ventricle: The right ventricular size is normal. No increase in right ventricular wall thickness. Right ventricular systolic function is normal. Left Atrium: Left atrial size was normal in size. Right Atrium: Right atrial size was normal  in size. Pericardium: There is no evidence of pericardial effusion. Mitral Valve: The mitral valve is normal in structure. Moderate mitral annular calcification. No evidence of mitral valve regurgitation. No evidence of mitral valve stenosis. Tricuspid Valve: The tricuspid valve is normal in structure. Tricuspid valve regurgitation is not demonstrated. No evidence of tricuspid stenosis. Aortic Valve: The aortic valve is normal in structure. Aortic valve regurgitation is not visualized. No aortic stenosis is present. Pulmonic Valve: The pulmonic valve was normal in structure. Pulmonic valve regurgitation is not visualized. No evidence of pulmonic stenosis. Aorta: The aortic root is normal in size and structure. Venous: The inferior vena cava is normal in size with greater than 50% respiratory variability, suggesting right atrial pressure of 3 mmHg. IAS/Shunts: No atrial level shunt detected by color flow Doppler.  LEFT VENTRICLE PLAX 2D LVIDd:         4.70 cm   Diastology LVIDs:         3.00 cm   LV e' medial:    7.77 cm/s LV PW:          0.80 cm   LV E/e' medial:  17.1 LV IVS:        0.80 cm   LV e' lateral:   11.50 cm/s LVOT diam:     2.00 cm   LV E/e' lateral: 11.6 LV SV:         80 LV SV Index:   48 LVOT Area:     3.14 cm  RIGHT VENTRICLE             IVC RV S prime:     18.30 cm/s  IVC diam: 1.15 cm TAPSE (M-mode): 2.5 cm LEFT ATRIUM           Index        RIGHT ATRIUM           Index LA Vol (A4C): 27.1 ml 16.31 ml/m  RA Area:     11.40 cm                                    RA Volume:   25.80 ml  15.53 ml/m  AORTIC VALVE LVOT Vmax:   136.00 cm/s LVOT Vmean:  88.100 cm/s LVOT VTI:    0.256 m  AORTA Ao Root diam: 2.70 cm Ao Asc diam:  3.10 cm MITRAL VALVE MV Area (PHT): 3.17 cm     SHUNTS MV Decel Time: 239 msec     Systemic VTI:  0.26 m MV E velocity: 133.00 cm/s  Systemic Diam: 2.00 cm MV A velocity: 153.00 cm/s MV E/A ratio:  0.87 Dorothye Gathers MD Electronically signed by Dorothye Gathers MD Signature Date/Time: 03/21/2024/11:28:19 AM    Final    DG ESOPHAGUS W SINGLE CM (SOL OR THIN BA) Result Date: 03/20/2024 CLINICAL DATA:  78 year old female with a history of GERD complicated by esophageal stricture requiring multiple esophageal dilations and large hiatal hernia. Patient presented to the ED due to sharp chest pains secondary to multiple emetic episodes. Request for esophagram to rule out perforation. EXAM: ESOPHAGUS/BARIUM SWALLOW STUDY TECHNIQUE: Single contrast examination was performed using thin liquid barium. This exam was performed by Estella Helling, PA-C, and was supervised and interpreted by Dr. Sylvester Evert. FLUOROSCOPY: Radiation Exposure Index (as provided by the fluoroscopic device): 13.2 mGy Kerma COMPARISON:  DG UGI W KUB - 06/25/2015 FINDINGS: Swallowing: Appears normal. No vestibular penetration or aspiration  seen. Pharynx: Unremarkable. Esophagus: Tortuous lower third of the esophagus with a similar distal stricture at the GE junction just proximal to hiatal hernia. Esophageal motility: Mild dysmotility with intermittent proximal  escape. Hiatal Hernia: Large hiatal hernia. Other: Barium draining appropriately through the GE junction into the stomach. No evidence of perforation or extravasation. Evaluation performed at 40 degree table tilt due to shortness of breath on standing. IMPRESSION: Tortuous esophagus with mild dysmotility and similar appearing distal esophageal stricture at the GE junction. No obstruction. Large hiatal hernia. No evidence of perforation or extravasation. Electronically Signed   By: Melven Stable.  Shick M.D.   On: 03/20/2024 18:04   CT CHEST ABDOMEN PELVIS WO CONTRAST Result Date: 03/19/2024 CLINICAL DATA:  Preoperative thoracic aortic disease. Preoperative. Question esophageal perforation or hernia versus dissection. EXAM: CT CHEST, ABDOMEN AND PELVIS WITHOUT CONTRAST TECHNIQUE: Multidetector CT imaging of the chest, abdomen and pelvis was performed following the standard protocol without IV contrast. RADIATION DOSE REDUCTION: This exam was performed according to the departmental dose-optimization program which includes automated exposure control, adjustment of the mA and/or kV according to patient size and/or use of iterative reconstruction technique. COMPARISON:  CT of chest 06/16/2023 FINDINGS: CT CHEST FINDINGS Cardiovascular: No significant vascular findings. Normal heart size. No pericardial effusion. There are atherosclerotic calcifications of the aorta and coronary arteries. Mediastinum/Nodes: There is a large hiatal hernia containing the proximal stomach, unchanged. There is wall thickening of the distal esophagus and gastroesophageal junction. There is no pneumomediastinum, but there is a small amount of free fluid surrounding the distal esophagus. The esophagus is nondilated. No enlarged lymph nodes are seen. Thyroid gland is not visualized. Lungs/Pleura: There is scattered peripheral reticular ground-glass opacities throughout both lungs similar to the prior study. There is scarring or atelectasis in the lingula,  also unchanged. Calcified granulomas are seen. There is a noncalcified nodule in the left lower lobe measuring 3 mm image 6/95 which is unchanged. There is no focal lung infiltrate, pleural effusion or pneumothorax. Musculoskeletal: No acute osseous abnormality. CT ABDOMEN PELVIS FINDINGS Hepatobiliary: There are rounded hypodensities in the liver which are unchanged, likely cysts. These measure up to 14 mm. The gallbladder surgically absent. There is no biliary ductal dilatation. Pancreas: Unremarkable. No pancreatic ductal dilatation or surrounding inflammatory changes. Spleen: Normal in size without focal abnormality. Adrenals/Urinary Tract: Adrenal glands are unremarkable. Kidneys are normal, without renal calculi or hydronephrosis. There is a cyst in the superior pole the left kidney measuring 9 mm. Bladder is unremarkable. Stomach/Bowel: Stomach is within normal limits. Appendix appears normal. No evidence of bowel wall thickening, distention, or inflammatory changes. There is sigmoid and descending colon diverticulosis. Vascular/Lymphatic: Aortic atherosclerosis. No enlarged abdominal or pelvic lymph nodes. Reproductive: Status post hysterectomy. No adnexal masses. Other: No abdominal wall hernia or abnormality. No abdominopelvic ascites. Musculoskeletal: No acute or significant osseous findings. IMPRESSION: 1. Large hiatal hernia containing the proximal stomach, unchanged. 2. Wall thickening of the distal esophagus and gastroesophageal junction with small amount of free fluid surrounding the distal esophagus. Findings are worrisome for esophagitis. Underlying neoplastic process is not excluded. 3. No pneumomediastinum. 4. No acute localizing process in the abdomen or pelvis. 5. Colonic diverticulosis. 6. Stable 3 mm left lower lobe pulmonary nodule. No follow-up needed if patient is low-risk.This recommendation follows the consensus statement: Guidelines for Management of Incidental Pulmonary Nodules  Detected on CT Images: From the Fleischner Society 2017; Radiology 2017; 284:228-243. 7. Stable peripheral reticular ground-glass opacities throughout both lungs, possibly chronic interstitial lung disease.  8. Aortic atherosclerosis. Aortic Atherosclerosis (ICD10-I70.0). Electronically Signed   By: Tyron Gallon M.D.   On: 03/19/2024 18:02   DG Chest 2 View Result Date: 03/19/2024 CLINICAL DATA:  Chest pain EXAM: CHEST - 2 VIEW COMPARISON:  CT 06/16/2023, and previous FINDINGS: Mild pulmonary hyperinflation with chronic coarse interstitial markings . No new infiltrate or overt edema. No pneumothorax. Heart size upper limits normal. No effusion. Vertebral endplate spurring at multiple levels in the mid and lower thoracic spine. IMPRESSION: 1. No acute findings. 2. COPD. Electronically Signed   By: Nicoletta Barrier M.D.   On: 03/19/2024 17:24       Impression / Plan:   1) Chest pain 2) Esophagitis on CT 3) Large hiatal hernia 4) Distal esophageal stricture 5) GERD 6) Nausea/Vomiting  78 year old female with longstanding history of GERD, complicated by large hiatal hernia and distal esophageal stricture which has been amenable to esophageal dilation in the past, most recently in 10/2023.  Now admitted with chest pain and nausea/vomiting.  Admission CT initially concerning for microperforation, but subsequent esophagram without any extravasation. - Continue high-dose IV PPI - Antiemetics as needed if return of nausea/vomiting - Can consider addition of liquid sucralfate if persisting symptoms - Clear liquid diet as tolerated - Endoscopy right now would be largely diagnostic, and do not feel would change management at this juncture.  Instead plan for medical management.  Discussed with patient and she agrees - Troponin negative and TTE normal  7) AKI on CKD 3 - Renal function improving - Management per primary Hospitalist service  8) Normocytic anemia Mild decrement of H/H superimposed on chronic  normocytic anemia.  Suspect dilutional component.  Otherwise no overt bleeding. - Continue trending  Inpatient GI service will remain available as needed.  Will otherwise plan for outpatient follow-up with repeat upper endoscopy and esophageal dilation as appropriate if any dysphagia or other persisting symptoms   Harry Lindau, DO, [facility]  Gastroenterology    LOS: 0 days   Annis Kinder  03/21/2024, 11:29 AM

## 2024-03-21 NOTE — Telephone Encounter (Signed)
 Patient has been scheduled for follow up with Everett Hitt, NP on 04/21/24. Patient is currently still inpatient status. Will reach out to her after discharge to advise her of appointment information.

## 2024-03-21 NOTE — Progress Notes (Addendum)
 PROGRESS NOTE    Kelly Mooney  ZOX:[id number] DOB: 12/27/1945 DOA: 03/19/2024 PCP: Jimmey Mould, MD   Brief Narrative: 78 year old with past medical history significant for hypertension, hyperlipidemia, COPD, diabetes type 2, GERD presents complaining of chest pain and vomiting.  She experienced sudden onset of severe chest pain accompanied by chest this past Monday and vomiting.  She vomited 3 times.  Evaluation in the ED CT scan of the chest abdomen and pelvis without contrast revealed large hiatal hernia containing the proximal stomach, wall thickening of the distal esophagus and gastroesophageal junction with a small amount of free fluid surrounding the distal esophagus worrisome for esophagitis.  Case was discussed with cardiothoracic surgery who recommend upper GI series, which showed: Tortuous esophagus with mild dysmotility and similar appearing distal esophageal stricture at the GE junction. No obstruction. Large hiatal hernia. No evidence of perforation or extravasation.   GI consulted and recommended supportive care, high-dose IV PPI    Assessment & Plan:   Principal Problem:   Chest pain Active Problems:   Elevated troponin   Esophagitis   Leukocytosis   Acute kidney injury superimposed on chronic kidney disease (HCC)   Essential hypertension   Normocytic anemia   Controlled type 2 diabetes mellitus without complication, without long-term current use of insulin  (HCC)   Hypothyroidism   Hiatal hernia  1-Chest pain, Esophagitis Hiatal  hernia - Patient presented with acute onset severe chest pain, nausea and vomiting. - CT chest showed wall thickening of the distal esophagus and gastroesophageal junction with a small amount of free fluid surrounding the distal esophagus concerning for esophagitis.  - Case was discussed with CVTS due to severity of the symptoms and the small free fluid surrounding the esophagus.  Recommendation was for esophagogram. - Esophagogram:  No evidence of perforation or extravasation.Tortuous esophagus with mild dysmotility and similar appearing distal esophageal stricture at the GE junction.  GI consulted, recommended high-dose IV PPI clear liquid diet advance as tolerated on medical management Will discontinue IV antibiotics  Elevation of troponin: Initially mild suspect related to AKI.  Subsequent troponins has been normal ECHO; normal EF, no wall motion abnormalities.   Acute kidney injury on CKD 3B Send with a creatinine of 2 baseline 1.2--1.5 In the setting of hypovolemia and dehydration vomiting. Continue with IV fluids Renal Function improving  Normocytic anemia: Follow trend  Diabetes type 2 without long-term use of insulin : Monitor CBG  Hypothyroidism: Continue with Synthroid    Estimated body mass index is 29.71 kg/m as calculated from the following:   Height as of this encounter: 5' (1.524 m).   Weight as of this encounter: 69 kg.   DVT prophylaxis: Lovenox  Code Status: Full code Family Communication: Care discussed with patient Disposition Plan:  Status is: Observation The patient remains OBS appropriate and will d/c before 2 midnights.    Consultants:  GI  Procedures:  None  Antimicrobials:    Subjective: She reports chest this past month, she really think is coming from her esophagus. Objective: Vitals:   03/20/24 2339 03/21/24 0508 03/21/24 0750 03/21/24 1144  BP: (!) 127/45 (!) 133/57 (!) 140/53 (!) 116/51  Pulse: 83 77 88 84  Resp: 19 18 17 18   Temp: 98.2 F (36.8 C) 97.8 F (36.6 C) 98.3 F (36.8 C) 97.9 F (36.6 C)  TempSrc: Oral Oral Oral Oral  SpO2: 96% 99% 96% 96%  Weight:      Height:        Intake/Output Summary (Last 24 hours)  at 03/21/2024 1543 Last data filed at 03/21/2024 0803 Gross per 24 hour  Intake 881.92 ml  Output --  Net 881.92 ml   Filed Weights   03/19/24 1550 03/20/24 1355  Weight: 68 kg 69 kg    Examination:  General exam: Appears calm and  comfortable  Respiratory system: Clear to auscultation. Respiratory effort normal. Cardiovascular system: S1 & S2 heard, RRR. No JVD, murmurs, rubs, gallops or clicks. No pedal edema. Gastrointestinal system: Abdomen is nondistended, soft and nontender. No organomegaly or masses felt. Normal bowel sounds heard. Central nervous system: Alert and oriented.  Extremities: Symmetric 5 x 5 power.    Data Reviewed: I have personally reviewed following labs and imaging studies  CBC: Recent Labs  Lab 03/19/24 1634 03/20/24 1401 03/21/24 0501  WBC 10.9* 8.0 6.2  NEUTROABS 8.0* 5.0  --   HGB 11.8* 11.0* 10.1*  HCT 35.1* 34.1* 30.9*  MCV 86.7 90.0 89.6  PLT 217 219 190   Basic Metabolic Panel: Recent Labs  Lab 03/19/24 1634 03/20/24 1401 03/21/24 0501  NA 143 144 142  K 3.9 4.6 4.0  CL 107 109 109  CO2 20* 24 24  GLUCOSE 133* 100* 111*  BUN 31* 37* 34*  CREATININE 1.47* 2.00* 1.63*  CALCIUM  10.1 9.5 8.5*   GFR: Estimated Creatinine Clearance: 24.7 mL/min (A) (by C-G formula based on SCr of 1.63 mg/dL (H)). Liver Function Tests: Recent Labs  Lab 03/19/24 1634  AST 21  ALT 15  ALKPHOS 39  BILITOT 0.6  PROT 6.5  ALBUMIN 4.2   Recent Labs  Lab 03/19/24 1634  LIPASE 33   No results for input(s): "AMMONIA" in the last 168 hours. Coagulation Profile: No results for input(s): "INR", "PROTIME" in the last 168 hours. Cardiac Enzymes: No results for input(s): "CKTOTAL", "CKMB", "CKMBINDEX", "TROPONINI" in the last 168 hours. BNP (last 3 results) Recent Labs    03/19/24 1634  PROBNP 519.0*   HbA1C: No results for input(s): "HGBA1C" in the last 72 hours. CBG: No results for input(s): "GLUCAP" in the last 168 hours. Lipid Profile: No results for input(s): "CHOL", "HDL", "LDLCALC", "TRIG", "CHOLHDL", "LDLDIRECT" in the last 72 hours. Thyroid Function Tests: Recent Labs    03/20/24 1401  TSH 0.457   Anemia Panel: No results for input(s): "VITAMINB12", "FOLATE",  "FERRITIN", "TIBC", "IRON", "RETICCTPCT" in the last 72 hours. Sepsis Labs: No results for input(s): "PROCALCITON", "LATICACIDVEN" in the last 168 hours.  No results found for this or any previous visit (from the past 240 hours).       Radiology Studies: ECHOCARDIOGRAM COMPLETE Result Date: 03/21/2024    ECHOCARDIOGRAM REPORT   Patient Name:   PLACIDA LUSCH Date of Exam: 03/21/2024 Medical Rec #:  [id number]        Height:       60.0 in Accession #:    [id number]       Weight:       152.1 lb Date of Birth:  1946-10-22         BSA:          1.662 m Patient Age:    77 years         BP:           140/53 mmHg Patient Gender: F                HR:           82 bpm. Exam Location:  Inpatient Procedure: 2D Echo, Color Doppler  and Cardiac Doppler (Both Spectral and Color            Flow Doppler were utilized during procedure). Indications:    chest pain  History:        Patient has prior history of Echocardiogram examinations, most                 recent 12/07/2022.  Sonographer:    Janette Medley Referring Phys: [id number] RONDELL A SMITH IMPRESSIONS  1. Left ventricular ejection fraction, by estimation, is 60 to 65%. The left ventricle has normal function. The left ventricle has no regional wall motion abnormalities. Left ventricular diastolic parameters are consistent with Grade I diastolic dysfunction (impaired relaxation).  2. Right ventricular systolic function is normal. The right ventricular size is normal.  3. The mitral valve is normal in structure. No evidence of mitral valve regurgitation. No evidence of mitral stenosis. Moderate mitral annular calcification.  4. The aortic valve is normal in structure. Aortic valve regurgitation is not visualized. No aortic stenosis is present.  5. The inferior vena cava is normal in size with greater than 50% respiratory variability, suggesting right atrial pressure of 3 mmHg. FINDINGS  Left Ventricle: Left ventricular ejection fraction, by estimation, is 60 to 65%. The  left ventricle has normal function. The left ventricle has no regional wall motion abnormalities. The left ventricular internal cavity size was normal in size. There is  no left ventricular hypertrophy. Left ventricular diastolic parameters are consistent with Grade I diastolic dysfunction (impaired relaxation). Right Ventricle: The right ventricular size is normal. No increase in right ventricular wall thickness. Right ventricular systolic function is normal. Left Atrium: Left atrial size was normal in size. Right Atrium: Right atrial size was normal in size. Pericardium: There is no evidence of pericardial effusion. Mitral Valve: The mitral valve is normal in structure. Moderate mitral annular calcification. No evidence of mitral valve regurgitation. No evidence of mitral valve stenosis. Tricuspid Valve: The tricuspid valve is normal in structure. Tricuspid valve regurgitation is not demonstrated. No evidence of tricuspid stenosis. Aortic Valve: The aortic valve is normal in structure. Aortic valve regurgitation is not visualized. No aortic stenosis is present. Pulmonic Valve: The pulmonic valve was normal in structure. Pulmonic valve regurgitation is not visualized. No evidence of pulmonic stenosis. Aorta: The aortic root is normal in size and structure. Venous: The inferior vena cava is normal in size with greater than 50% respiratory variability, suggesting right atrial pressure of 3 mmHg. IAS/Shunts: No atrial level shunt detected by color flow Doppler.  LEFT VENTRICLE PLAX 2D LVIDd:         4.70 cm   Diastology LVIDs:         3.00 cm   LV e' medial:    7.77 cm/s LV PW:         0.80 cm   LV E/e' medial:  17.1 LV IVS:        0.80 cm   LV e' lateral:   11.50 cm/s LVOT diam:     2.00 cm   LV E/e' lateral: 11.6 LV SV:         80 LV SV Index:   48 LVOT Area:     3.14 cm  RIGHT VENTRICLE             IVC RV S prime:     18.30 cm/s  IVC diam: 1.15 cm TAPSE (M-mode): 2.5 cm LEFT ATRIUM           Index  RIGHT  ATRIUM           Index LA Vol (A4C): 27.1 ml 16.31 ml/m  RA Area:     11.40 cm                                    RA Volume:   25.80 ml  15.53 ml/m  AORTIC VALVE LVOT Vmax:   136.00 cm/s LVOT Vmean:  88.100 cm/s LVOT VTI:    0.256 m  AORTA Ao Root diam: 2.70 cm Ao Asc diam:  3.10 cm MITRAL VALVE MV Area (PHT): 3.17 cm     SHUNTS MV Decel Time: 239 msec     Systemic VTI:  0.26 m MV E velocity: 133.00 cm/s  Systemic Diam: 2.00 cm MV A velocity: 153.00 cm/s MV E/A ratio:  0.87 Dorothye Gathers MD Electronically signed by Dorothye Gathers MD Signature Date/Time: 03/21/2024/11:28:19 AM    Final    DG ESOPHAGUS W SINGLE CM (SOL OR THIN BA) Result Date: 03/20/2024 CLINICAL DATA:  78 year old female with a history of GERD complicated by esophageal stricture requiring multiple esophageal dilations and large hiatal hernia. Patient presented to the ED due to sharp chest pains secondary to multiple emetic episodes. Request for esophagram to rule out perforation. EXAM: ESOPHAGUS/BARIUM SWALLOW STUDY TECHNIQUE: Single contrast examination was performed using thin liquid barium. This exam was performed by Estella Helling, PA-C, and was supervised and interpreted by Dr. Sylvester Evert. FLUOROSCOPY: Radiation Exposure Index (as provided by the fluoroscopic device): 13.2 mGy Kerma COMPARISON:  DG UGI W KUB - 06/25/2015 FINDINGS: Swallowing: Appears normal. No vestibular penetration or aspiration seen. Pharynx: Unremarkable. Esophagus: Tortuous lower third of the esophagus with a similar distal stricture at the GE junction just proximal to hiatal hernia. Esophageal motility: Mild dysmotility with intermittent proximal escape. Hiatal Hernia: Large hiatal hernia. Other: Barium draining appropriately through the GE junction into the stomach. No evidence of perforation or extravasation. Evaluation performed at 40 degree table tilt due to shortness of breath on standing. IMPRESSION: Tortuous esophagus with mild dysmotility and similar appearing distal  esophageal stricture at the GE junction. No obstruction. Large hiatal hernia. No evidence of perforation or extravasation. Electronically Signed   By: Melven Stable.  Shick M.D.   On: 03/20/2024 18:04   CT CHEST ABDOMEN PELVIS WO CONTRAST Result Date: 03/19/2024 CLINICAL DATA:  Preoperative thoracic aortic disease. Preoperative. Question esophageal perforation or hernia versus dissection. EXAM: CT CHEST, ABDOMEN AND PELVIS WITHOUT CONTRAST TECHNIQUE: Multidetector CT imaging of the chest, abdomen and pelvis was performed following the standard protocol without IV contrast. RADIATION DOSE REDUCTION: This exam was performed according to the departmental dose-optimization program which includes automated exposure control, adjustment of the mA and/or kV according to patient size and/or use of iterative reconstruction technique. COMPARISON:  CT of chest 06/16/2023 FINDINGS: CT CHEST FINDINGS Cardiovascular: No significant vascular findings. Normal heart size. No pericardial effusion. There are atherosclerotic calcifications of the aorta and coronary arteries. Mediastinum/Nodes: There is a large hiatal hernia containing the proximal stomach, unchanged. There is wall thickening of the distal esophagus and gastroesophageal junction. There is no pneumomediastinum, but there is a small amount of free fluid surrounding the distal esophagus. The esophagus is nondilated. No enlarged lymph nodes are seen. Thyroid gland is not visualized. Lungs/Pleura: There is scattered peripheral reticular ground-glass opacities throughout both lungs similar to the prior study. There is scarring or atelectasis in the lingula, also unchanged.  Calcified granulomas are seen. There is a noncalcified nodule in the left lower lobe measuring 3 mm image 6/95 which is unchanged. There is no focal lung infiltrate, pleural effusion or pneumothorax. Musculoskeletal: No acute osseous abnormality. CT ABDOMEN PELVIS FINDINGS Hepatobiliary: There are rounded  hypodensities in the liver which are unchanged, likely cysts. These measure up to 14 mm. The gallbladder surgically absent. There is no biliary ductal dilatation. Pancreas: Unremarkable. No pancreatic ductal dilatation or surrounding inflammatory changes. Spleen: Normal in size without focal abnormality. Adrenals/Urinary Tract: Adrenal glands are unremarkable. Kidneys are normal, without renal calculi or hydronephrosis. There is a cyst in the superior pole the left kidney measuring 9 mm. Bladder is unremarkable. Stomach/Bowel: Stomach is within normal limits. Appendix appears normal. No evidence of bowel wall thickening, distention, or inflammatory changes. There is sigmoid and descending colon diverticulosis. Vascular/Lymphatic: Aortic atherosclerosis. No enlarged abdominal or pelvic lymph nodes. Reproductive: Status post hysterectomy. No adnexal masses. Other: No abdominal wall hernia or abnormality. No abdominopelvic ascites. Musculoskeletal: No acute or significant osseous findings. IMPRESSION: 1. Large hiatal hernia containing the proximal stomach, unchanged. 2. Wall thickening of the distal esophagus and gastroesophageal junction with small amount of free fluid surrounding the distal esophagus. Findings are worrisome for esophagitis. Underlying neoplastic process is not excluded. 3. No pneumomediastinum. 4. No acute localizing process in the abdomen or pelvis. 5. Colonic diverticulosis. 6. Stable 3 mm left lower lobe pulmonary nodule. No follow-up needed if patient is low-risk.This recommendation follows the consensus statement: Guidelines for Management of Incidental Pulmonary Nodules Detected on CT Images: From the Fleischner Society 2017; Radiology 2017; 284:228-243. 7. Stable peripheral reticular ground-glass opacities throughout both lungs, possibly chronic interstitial lung disease. 8. Aortic atherosclerosis. Aortic Atherosclerosis (ICD10-I70.0). Electronically Signed   By: Tyron Gallon M.D.   On:  03/19/2024 18:02   DG Chest 2 View Result Date: 03/19/2024 CLINICAL DATA:  Chest pain EXAM: CHEST - 2 VIEW COMPARISON:  CT 06/16/2023, and previous FINDINGS: Mild pulmonary hyperinflation with chronic coarse interstitial markings . No new infiltrate or overt edema. No pneumothorax. Heart size upper limits normal. No effusion. Vertebral endplate spurring at multiple levels in the mid and lower thoracic spine. IMPRESSION: 1. No acute findings. 2. COPD. Electronically Signed   By: Nicoletta Barrier M.D.   On: 03/19/2024 17:24        Scheduled Meds:  enoxaparin  (LOVENOX ) injection  30 mg Subcutaneous Q24H   levothyroxine   125 mcg Oral QAC breakfast   pantoprazole  (PROTONIX ) IV  40 mg Intravenous Q12H   sodium chloride  flush  3 mL Intravenous Q12H   Continuous Infusions:  sodium chloride  75 mL/hr at 03/21/24 0617   piperacillin-tazobactam (ZOSYN)  IV 3.375 g (03/21/24 0803)     LOS: 0 days    Time spent: 35 minutes    Jeslin Bazinet A Jaeven Wanzer, MD [facility]   If 7PM-7AM, please contact night-coverage [email or web address]  03/21/2024, 3:43 PM

## 2024-03-21 NOTE — Telephone Encounter (Signed)
-----   Message from Annis Kinder sent at 03/21/2024  2:38 PM EDT ----- This is a patient of Dr. Elvin Hammer.  Expect she should be discharged home in the next day or so.  Will need routine follow-up with him or one of the pod A APP's.  Thanks.

## 2024-03-21 NOTE — TOC CM/SW Note (Signed)
 Transition of Care [facility]) - Inpatient Brief Assessment   Patient Details  Name: FELICITI GENTRY MRN: [id number] Date of Birth: 08/11/1946  [facility] of [facility]) CM/SW Contact:    Cosimo Diones, RN Phone Number: 03/21/2024, 4:11 PM   Clinical Narrative: Patient presented for chest pain. PTA patient was independent from home. Patient has support of daughter and she will provide transportation home. Patient has PCP and she gets to appointments without any issues. No home needs identified at the time of the visit. Case Manager will continue to follow.    Transition of Care Asessment: Insurance and Status: Insurance coverage has been reviewed Patient has primary care physician: Yes Home environment has been reviewed: reviewed Prior level of function:: independent Prior/Current Home Services: No current home services Social Drivers of Health Review: SDOH reviewed no interventions necessary Readmission risk has been reviewed: Yes Transition of care needs: no transition of care needs at this time

## 2024-03-22 ENCOUNTER — Other Ambulatory Visit (HOSPITAL_COMMUNITY): Payer: Self-pay

## 2024-03-22 DIAGNOSIS — R079 Chest pain, unspecified: Secondary | ICD-10-CM | POA: Diagnosis not present

## 2024-03-22 LAB — CBC
HCT: 29.9 % — ABNORMAL LOW (ref 36.0–46.0)
Hemoglobin: 10 g/dL — ABNORMAL LOW (ref 12.0–15.0)
MCH: 29.1 pg (ref 26.0–34.0)
MCHC: 33.4 g/dL (ref 30.0–36.0)
MCV: 86.9 fL (ref 80.0–100.0)
Platelets: 184 10*3/uL (ref 150–400)
RBC: 3.44 MIL/uL — ABNORMAL LOW (ref 3.87–5.11)
RDW: 13.1 % (ref 11.5–15.5)
WBC: 5.3 10*3/uL (ref 4.0–10.5)
nRBC: 0 % (ref 0.0–0.2)

## 2024-03-22 LAB — BASIC METABOLIC PANEL WITH GFR
Anion gap: 7 (ref 5–15)
BUN: 16 mg/dL (ref 8–23)
CO2: 24 mmol/L (ref 22–32)
Calcium: 8.7 mg/dL — ABNORMAL LOW (ref 8.9–10.3)
Chloride: 110 mmol/L (ref 98–111)
Creatinine, Ser: 1.18 mg/dL — ABNORMAL HIGH (ref 0.44–1.00)
GFR, Estimated: 47 mL/min — ABNORMAL LOW (ref >60)
Glucose, Bld: 109 mg/dL — ABNORMAL HIGH (ref 70–99)
Potassium: 3.4 mmol/L — ABNORMAL LOW (ref 3.5–5.1)
Sodium: 141 mmol/L (ref 135–145)

## 2024-03-22 LAB — GLUCOSE, CAPILLARY
Glucose-Capillary: 128 mg/dL — ABNORMAL HIGH (ref 70–99)
Glucose-Capillary: 133 mg/dL — ABNORMAL HIGH (ref 70–99)

## 2024-03-22 MED ORDER — POTASSIUM CHLORIDE 20 MEQ PO PACK
40.0000 meq | PACK | Freq: Once | ORAL | Status: AC
  Administered 2024-03-22: 40 meq via ORAL
  Filled 2024-03-22: qty 2

## 2024-03-22 MED ORDER — PANTOPRAZOLE SODIUM 40 MG PO TBEC
40.0000 mg | DELAYED_RELEASE_TABLET | Freq: Two times a day (BID) | ORAL | 0 refills | Status: DC
  Filled 2024-03-22: qty 60, 30d supply, fill #0

## 2024-03-22 NOTE — Telephone Encounter (Signed)
 Advised patient of appointment scheduled with Colleen Kennedy-Clarie Camey, NP on 04/21/24. Patient verbalizes understanding.

## 2024-03-22 NOTE — Discharge Summary (Signed)
 Physician Discharge Summary  Kelly Mooney:[id number] DOB: 1946-05-27 DOA: 03/19/2024  PCP: Jimmey Mould, MD  Admit date: 03/19/2024 Discharge date: 03/22/2024    Admitted From: Home Disposition: Home  Recommendations for Outpatient Follow-up:  Follow up with PCP in 1-2 weeks Please obtain BMP/CBC in one week Follow-up with your primary GI for repeat endoscopy for esophageal stricture Please follow up with your PCP on the following pending results: Unresulted Labs (From admission, onward)    None         Home Health: None Equipment/Devices: None  Discharge Condition: Stable CODE STATUS: Full code Diet recommendation: Cardiac  Subjective: Seen and examined.  As soon as I entered the room and asked her how she was feeling, she said " I am ready to go home".  She had no complaints.  She is only on full liquid diet so I offered her to stay and let me advance to soft diet and to let her eat soft diet for lunch and then decide but she did not want to wait stating that she tried regular diet 2 days ago and she did fine and that her daughter has to come pick her up and then she has to go to work so she would like to be discharged as soon as possible.  Per her request, I discharged her right away.  Brief/Interim Summary: 79 year old with past medical history significant for hypertension, hyperlipidemia, COPD, diabetes type 2, GERD presents complaining of chest pain and vomiting.  She experienced sudden onset of severe chest pain accompanied by chest this past Monday and vomiting.  She vomited 3 times.  Evaluation in the ED CT scan of the chest abdomen and pelvis without contrast revealed large hiatal hernia containing the proximal stomach, wall thickening of the distal esophagus and gastroesophageal junction with a small amount of free fluid surrounding the distal esophagus worrisome for esophagitis.  Case was discussed with cardiothoracic surgery who recommend upper GI series, which  showed: Tortuous esophagus with mild dysmotility and similar appearing distal esophageal stricture at the GE junction. No obstruction. Large hiatal hernia. No evidence of perforation or extravasation.  Details below.   1-Chest pain / Esophagitis/GERD/hiatal hernia - Patient presented with acute onset severe chest pain, nausea and vomiting. - CT chest showed wall thickening of the distal esophagus and gastroesophageal junction with a small amount of free fluid surrounding the distal esophagus concerning for esophagitis.  Case was discussed with CVTS due to severity of the symptoms and the small free fluid surrounding the esophagus.  Recommendation was for esophagogram which showed no evidence of perforation or extravasation.Tortuous esophagus with mild dysmotility and similar appearing distal esophageal stricture at the GE junction. GI consulted, recommended high-dose IV PPI clear liquid diet advance as tolerated on medical management.  IV antibiotics discontinued.  Patient's symptoms improved.  She wants to go home.  She was on once daily PPI.  Will discharge on twice daily PPI for 30 days.  GI will follow-up with her as outpatient.   Elevation of troponin: Initially mild suspect related to AKI.  Subsequent troponins has been normal. ECHO; normal EF, no wall motion abnormalities.    Acute kidney injury on CKD 3B Presented with creatinine of 2, baseline 1.2--1.5, likely in the setting of hypovolemia and dehydration vomiting.  Received IV fluids and renal function back to baseline around 1.2 today.   Hypokalemia: 3.4.  Replenish before discharge.   Normocytic anemia: Stable   Diabetes type 2 without long-term use of insulin :  Resume PTA medications.   Hypothyroidism: Continue with Synthroid   Discharge plan was discussed with patient and/or family member and they verbalized understanding and agreed with it.  Discharge Diagnoses:  Principal Problem:   Chest pain Active Problems:   Elevated  troponin   Esophagitis   Leukocytosis   Acute kidney injury superimposed on chronic kidney disease (HCC)   Essential hypertension   Normocytic anemia   Controlled type 2 diabetes mellitus without complication, without long-term current use of insulin  (HCC)   Hypothyroidism   Hiatal hernia    Discharge Instructions   Allergies as of 03/22/2024       Reactions   Chloramphenicol Anaphylaxis, Other (See Comments)   Chloramphenicol is a medication used in the management and treatment of superficial eye infections such as bacterial conjunctivitis, and otitis externa.    Ivp Dye [iodinated Contrast Media] Anaphylaxis, Shortness Of Breath, Other (See Comments)   SEVERE convulsions, too   Crestor  [rosuvastatin  Calcium ] Other (See Comments)   Muscle cramps   Iodine Itching, Other (See Comments)   Topical version only does this   Sulfa Antibiotics Rash        Medication List     TAKE these medications    albuterol  108 (90 Base) MCG/ACT inhaler Commonly known as: VENTOLIN  HFA Inhale 2 puffs into the lungs 4 (four) times daily.   albuterol  (2.5 MG/3ML) 0.083% nebulizer solution Commonly known as: PROVENTIL  Take 2.5 mg by nebulization every 6 (six) hours as needed for wheezing or shortness of breath.   Artificial Tears 1 % ophthalmic solution Generic drug: carboxymethylcellulose Place 1 drop into both eyes 3 (three) times daily.   aspirin  EC 81 MG tablet Take 1 tablet (81 mg total) by mouth daily. Swallow whole. What changed: when to take this   atorvastatin  40 MG tablet Commonly known as: LIPITOR Take 1 tablet (40 mg total) by mouth daily. What changed: when to take this   benzonatate  100 MG capsule Commonly known as: TESSALON  Take 1 capsule (100 mg total) by mouth every 6 (six) hours as needed for cough.   Breztri  Aerosphere 160-9-4.8 MCG/ACT Aero inhaler Generic drug: budeson-glycopyrrolate-formoterol  INHALE 2 PUFFS BY MOUTH INTO THE LUNGS IN THE MORNING AND AT  BEDTIME   chlorthalidone 25 MG tablet Commonly known as: HYGROTON Take 25 mg by mouth daily.   clobetasol cream 0.05 % Commonly known as: TEMOVATE Apply 1 application  topically See admin instructions. Apply a thin layer to affected areas one to three times a day   fenofibrate  160 MG tablet Take 160 mg by mouth daily.   hydrOXYzine 25 MG tablet Commonly known as: ATARAX Take 25 mg by mouth every 8 (eight) hours as needed for anxiety.   irbesartan 300 MG tablet Commonly known as: AVAPRO Take 300 mg by mouth daily.   levothyroxine  125 MCG tablet Commonly known as: SYNTHROID  Take 125 mcg by mouth daily before breakfast.   magnesium oxide 400 (240 Mg) MG tablet Commonly known as: MAG-OX Take 400 mg by mouth 2 (two) times daily.   montelukast  10 MG tablet Commonly known as: SINGULAIR  Take 10 mg by mouth at bedtime.   Mounjaro  2.5 MG/0.5ML Pen Generic drug: tirzepatide  Inject 2.5 mg into the skin every Wednesday.   pantoprazole  40 MG tablet Commonly known as: PROTONIX  Take 1 tablet (40 mg total) by mouth 2 (two) times daily. What changed: when to take this   valACYclovir 1000 MG tablet Commonly known as: VALTREX Take 2,000 mg by mouth daily as needed (  for cold sores- take as directed).        Follow-up Information     Jimmey Mould, MD Follow up in 1 week(s).   Specialty: Family Medicine Contact information: [address] [place] [address] [phone]                Allergies  Allergen Reactions   Chloramphenicol Anaphylaxis and Other (See Comments)    Chloramphenicol is a medication used in the management and treatment of superficial eye infections such as bacterial conjunctivitis, and otitis externa.    Ivp Dye [Iodinated Contrast Media] Anaphylaxis, Shortness Of Breath and Other (See Comments)    SEVERE convulsions, too    Crestor  [Rosuvastatin  Calcium ] Other (See Comments)    Muscle cramps   Iodine Itching and Other (See  Comments)    Topical version only does this   Sulfa Antibiotics Rash    Consultations: GI and cardiothoracic surgery   Procedures/Studies: ECHOCARDIOGRAM COMPLETE Result Date: 03/21/2024    ECHOCARDIOGRAM REPORT   Patient Name:   KOLIE RISSMAN Date of Exam: 03/21/2024 Medical Rec #:  [id number]        Height:       60.0 in Accession #:    [id number]       Weight:       152.1 lb Date of Birth:  03/13/1946         BSA:          1.662 m Patient Age:    77 years         BP:           140/53 mmHg Patient Gender: F                HR:           82 bpm. Exam Location:  Inpatient Procedure: 2D Echo, Color Doppler and Cardiac Doppler (Both Spectral and Color            Flow Doppler were utilized during procedure). Indications:    chest pain  History:        Patient has prior history of Echocardiogram examinations, most                 recent 12/07/2022.  Sonographer:    Janette Medley Referring Phys: [id number] RONDELL A SMITH IMPRESSIONS  1. Left ventricular ejection fraction, by estimation, is 60 to 65%. The left ventricle has normal function. The left ventricle has no regional wall motion abnormalities. Left ventricular diastolic parameters are consistent with Grade I diastolic dysfunction (impaired relaxation).  2. Right ventricular systolic function is normal. The right ventricular size is normal.  3. The mitral valve is normal in structure. No evidence of mitral valve regurgitation. No evidence of mitral stenosis. Moderate mitral annular calcification.  4. The aortic valve is normal in structure. Aortic valve regurgitation is not visualized. No aortic stenosis is present.  5. The inferior vena cava is normal in size with greater than 50% respiratory variability, suggesting right atrial pressure of 3 mmHg. FINDINGS  Left Ventricle: Left ventricular ejection fraction, by estimation, is 60 to 65%. The left ventricle has normal function. The left ventricle has no regional wall motion abnormalities. The left ventricular  internal cavity size was normal in size. There is  no left ventricular hypertrophy. Left ventricular diastolic parameters are consistent with Grade I diastolic dysfunction (impaired relaxation). Right Ventricle: The right ventricular size is normal. No increase in right ventricular wall thickness. Right ventricular systolic function  is normal. Left Atrium: Left atrial size was normal in size. Right Atrium: Right atrial size was normal in size. Pericardium: There is no evidence of pericardial effusion. Mitral Valve: The mitral valve is normal in structure. Moderate mitral annular calcification. No evidence of mitral valve regurgitation. No evidence of mitral valve stenosis. Tricuspid Valve: The tricuspid valve is normal in structure. Tricuspid valve regurgitation is not demonstrated. No evidence of tricuspid stenosis. Aortic Valve: The aortic valve is normal in structure. Aortic valve regurgitation is not visualized. No aortic stenosis is present. Pulmonic Valve: The pulmonic valve was normal in structure. Pulmonic valve regurgitation is not visualized. No evidence of pulmonic stenosis. Aorta: The aortic root is normal in size and structure. Venous: The inferior vena cava is normal in size with greater than 50% respiratory variability, suggesting right atrial pressure of 3 mmHg. IAS/Shunts: No atrial level shunt detected by color flow Doppler.  LEFT VENTRICLE PLAX 2D LVIDd:         4.70 cm   Diastology LVIDs:         3.00 cm   LV e' medial:    7.77 cm/s LV PW:         0.80 cm   LV E/e' medial:  17.1 LV IVS:        0.80 cm   LV e' lateral:   11.50 cm/s LVOT diam:     2.00 cm   LV E/e' lateral: 11.6 LV SV:         80 LV SV Index:   48 LVOT Area:     3.14 cm  RIGHT VENTRICLE             IVC RV S prime:     18.30 cm/s  IVC diam: 1.15 cm TAPSE (M-mode): 2.5 cm LEFT ATRIUM           Index        RIGHT ATRIUM           Index LA Vol (A4C): 27.1 ml 16.31 ml/m  RA Area:     11.40 cm                                    RA  Volume:   25.80 ml  15.53 ml/m  AORTIC VALVE LVOT Vmax:   136.00 cm/s LVOT Vmean:  88.100 cm/s LVOT VTI:    0.256 m  AORTA Ao Root diam: 2.70 cm Ao Asc diam:  3.10 cm MITRAL VALVE MV Area (PHT): 3.17 cm     SHUNTS MV Decel Time: 239 msec     Systemic VTI:  0.26 m MV E velocity: 133.00 cm/s  Systemic Diam: 2.00 cm MV A velocity: 153.00 cm/s MV E/A ratio:  0.87 Dorothye Gathers MD Electronically signed by Dorothye Gathers MD Signature Date/Time: 03/21/2024/11:28:19 AM    Final    DG ESOPHAGUS W SINGLE CM (SOL OR THIN BA) Result Date: 03/20/2024 CLINICAL DATA:  78 year old female with a history of GERD complicated by esophageal stricture requiring multiple esophageal dilations and large hiatal hernia. Patient presented to the ED due to sharp chest pains secondary to multiple emetic episodes. Request for esophagram to rule out perforation. EXAM: ESOPHAGUS/BARIUM SWALLOW STUDY TECHNIQUE: Single contrast examination was performed using thin liquid barium. This exam was performed by Estella Helling, PA-C, and was supervised and interpreted by Dr. Sylvester Evert. FLUOROSCOPY: Radiation Exposure Index (as provided by the fluoroscopic device): 13.2 mGy  Kerma COMPARISON:  DG UGI W KUB - 06/25/2015 FINDINGS: Swallowing: Appears normal. No vestibular penetration or aspiration seen. Pharynx: Unremarkable. Esophagus: Tortuous lower third of the esophagus with a similar distal stricture at the GE junction just proximal to hiatal hernia. Esophageal motility: Mild dysmotility with intermittent proximal escape. Hiatal Hernia: Large hiatal hernia. Other: Barium draining appropriately through the GE junction into the stomach. No evidence of perforation or extravasation. Evaluation performed at 40 degree table tilt due to shortness of breath on standing. IMPRESSION: Tortuous esophagus with mild dysmotility and similar appearing distal esophageal stricture at the GE junction. No obstruction. Large hiatal hernia. No evidence of perforation or  extravasation. Electronically Signed   By: Melven Stable.  Shick M.D.   On: 03/20/2024 18:04   CT CHEST ABDOMEN PELVIS WO CONTRAST Result Date: 03/19/2024 CLINICAL DATA:  Preoperative thoracic aortic disease. Preoperative. Question esophageal perforation or hernia versus dissection. EXAM: CT CHEST, ABDOMEN AND PELVIS WITHOUT CONTRAST TECHNIQUE: Multidetector CT imaging of the chest, abdomen and pelvis was performed following the standard protocol without IV contrast. RADIATION DOSE REDUCTION: This exam was performed according to the departmental dose-optimization program which includes automated exposure control, adjustment of the mA and/or kV according to patient size and/or use of iterative reconstruction technique. COMPARISON:  CT of chest 06/16/2023 FINDINGS: CT CHEST FINDINGS Cardiovascular: No significant vascular findings. Normal heart size. No pericardial effusion. There are atherosclerotic calcifications of the aorta and coronary arteries. Mediastinum/Nodes: There is a large hiatal hernia containing the proximal stomach, unchanged. There is wall thickening of the distal esophagus and gastroesophageal junction. There is no pneumomediastinum, but there is a small amount of free fluid surrounding the distal esophagus. The esophagus is nondilated. No enlarged lymph nodes are seen. Thyroid gland is not visualized. Lungs/Pleura: There is scattered peripheral reticular ground-glass opacities throughout both lungs similar to the prior study. There is scarring or atelectasis in the lingula, also unchanged. Calcified granulomas are seen. There is a noncalcified nodule in the left lower lobe measuring 3 mm image 6/95 which is unchanged. There is no focal lung infiltrate, pleural effusion or pneumothorax. Musculoskeletal: No acute osseous abnormality. CT ABDOMEN PELVIS FINDINGS Hepatobiliary: There are rounded hypodensities in the liver which are unchanged, likely cysts. These measure up to 14 mm. The gallbladder surgically  absent. There is no biliary ductal dilatation. Pancreas: Unremarkable. No pancreatic ductal dilatation or surrounding inflammatory changes. Spleen: Normal in size without focal abnormality. Adrenals/Urinary Tract: Adrenal glands are unremarkable. Kidneys are normal, without renal calculi or hydronephrosis. There is a cyst in the superior pole the left kidney measuring 9 mm. Bladder is unremarkable. Stomach/Bowel: Stomach is within normal limits. Appendix appears normal. No evidence of bowel wall thickening, distention, or inflammatory changes. There is sigmoid and descending colon diverticulosis. Vascular/Lymphatic: Aortic atherosclerosis. No enlarged abdominal or pelvic lymph nodes. Reproductive: Status post hysterectomy. No adnexal masses. Other: No abdominal wall hernia or abnormality. No abdominopelvic ascites. Musculoskeletal: No acute or significant osseous findings. IMPRESSION: 1. Large hiatal hernia containing the proximal stomach, unchanged. 2. Wall thickening of the distal esophagus and gastroesophageal junction with small amount of free fluid surrounding the distal esophagus. Findings are worrisome for esophagitis. Underlying neoplastic process is not excluded. 3. No pneumomediastinum. 4. No acute localizing process in the abdomen or pelvis. 5. Colonic diverticulosis. 6. Stable 3 mm left lower lobe pulmonary nodule. No follow-up needed if patient is low-risk.This recommendation follows the consensus statement: Guidelines for Management of Incidental Pulmonary Nodules Detected on CT Images: From the Fleischner Society 2017;  Radiology 2017; 284:228-243. 7. Stable peripheral reticular ground-glass opacities throughout both lungs, possibly chronic interstitial lung disease. 8. Aortic atherosclerosis. Aortic Atherosclerosis (ICD10-I70.0). Electronically Signed   By: Tyron Gallon M.D.   On: 03/19/2024 18:02   DG Chest 2 View Result Date: 03/19/2024 CLINICAL DATA:  Chest pain EXAM: CHEST - 2 VIEW COMPARISON:   CT 06/16/2023, and previous FINDINGS: Mild pulmonary hyperinflation with chronic coarse interstitial markings . No new infiltrate or overt edema. No pneumothorax. Heart size upper limits normal. No effusion. Vertebral endplate spurring at multiple levels in the mid and lower thoracic spine. IMPRESSION: 1. No acute findings. 2. COPD. Electronically Signed   By: Nicoletta Barrier M.D.   On: 03/19/2024 17:24     Discharge Exam: Vitals:   03/22/24 0410 03/22/24 0730  BP: 130/76 (!) 149/65  Pulse:  73  Resp: 16 16  Temp: 97.7 F (36.5 C) 98 F (36.7 C)  SpO2: 96% 99%   Vitals:   03/21/24 2024 03/21/24 2325 03/22/24 0410 03/22/24 0730  BP: (!) 155/55 (!) 146/41 130/76 (!) 149/65  Pulse: 74 82  73  Resp: 16 16 16 16   Temp: 98 F (36.7 C) 98 F (36.7 C) 97.7 F (36.5 C) 98 F (36.7 C)  TempSrc: Oral Oral Oral Oral  SpO2: 96% 96% 96% 99%  Weight:      Height:        General: Pt is alert, awake, not in acute distress Cardiovascular: RRR, S1/S2 +, no rubs, no gallops Respiratory: CTA bilaterally, no wheezing, no rhonchi Abdominal: Soft, NT, ND, bowel sounds + Extremities: no edema, no cyanosis    The results of significant diagnostics from this hospitalization (including imaging, microbiology, ancillary and laboratory) are listed below for reference.     Microbiology: No results found for this or any previous visit (from the past 240 hours).   Labs: BNP (last 3 results) No results for input(s): "BNP" in the last 8760 hours. Basic Metabolic Panel: Recent Labs  Lab 03/19/24 1634 03/20/24 1401 03/21/24 0501 03/22/24 0354  NA 143 144 142 141  K 3.9 4.6 4.0 3.4*  CL 107 109 109 110  CO2 20* 24 24 24   GLUCOSE 133* 100* 111* 109*  BUN 31* 37* 34* 16  CREATININE 1.47* 2.00* 1.63* 1.18*  CALCIUM  10.1 9.5 8.5* 8.7*   Liver Function Tests: Recent Labs  Lab 03/19/24 1634  AST 21  ALT 15  ALKPHOS 39  BILITOT 0.6  PROT 6.5  ALBUMIN 4.2   Recent Labs  Lab 03/19/24 1634   LIPASE 33   No results for input(s): "AMMONIA" in the last 168 hours. CBC: Recent Labs  Lab 03/19/24 1634 03/20/24 1401 03/21/24 0501 03/22/24 0354  WBC 10.9* 8.0 6.2 5.3  NEUTROABS 8.0* 5.0  --   --   HGB 11.8* 11.0* 10.1* 10.0*  HCT 35.1* 34.1* 30.9* 29.9*  MCV 86.7 90.0 89.6 86.9  PLT 217 219 190 184   Cardiac Enzymes: No results for input(s): "CKTOTAL", "CKMB", "CKMBINDEX", "TROPONINI" in the last 168 hours. BNP: Invalid input(s): "POCBNP" CBG: Recent Labs  Lab 03/21/24 1646 03/21/24 2101 03/22/24 0729  GLUCAP 153* 146* 128*   D-Dimer No results for input(s): "DDIMER" in the last 72 hours. Hgb A1c No results for input(s): "HGBA1C" in the last 72 hours. Lipid Profile No results for input(s): "CHOL", "HDL", "LDLCALC", "TRIG", "CHOLHDL", "LDLDIRECT" in the last 72 hours. Thyroid function studies Recent Labs    03/20/24 1401  TSH 0.457   Anemia work  up No results for input(s): "VITAMINB12", "FOLATE", "FERRITIN", "TIBC", "IRON", "RETICCTPCT" in the last 72 hours. Urinalysis    Component Value Date/Time   COLORURINE YELLOW 03/20/2024 2114   APPEARANCEUR CLEAR 03/20/2024 2114   LABSPEC 1.017 03/20/2024 2114   PHURINE 5.0 03/20/2024 2114   GLUCOSEU NEGATIVE 03/20/2024 2114   HGBUR NEGATIVE 03/20/2024 2114   BILIRUBINUR NEGATIVE 03/20/2024 2114   KETONESUR NEGATIVE 03/20/2024 2114   PROTEINUR NEGATIVE 03/20/2024 2114   NITRITE NEGATIVE 03/20/2024 2114   LEUKOCYTESUR NEGATIVE 03/20/2024 2114   Sepsis Labs Recent Labs  Lab 03/19/24 1634 03/20/24 1401 03/21/24 0501 03/22/24 0354  WBC 10.9* 8.0 6.2 5.3   Microbiology No results found for this or any previous visit (from the past 240 hours).  FURTHER DISCHARGE INSTRUCTIONS:   Get Medicines reviewed and adjusted: Please take all your medications with you for your next visit with your Primary MD   Laboratory/radiological data: Please request your Primary MD to go over all hospital tests and  procedure/radiological results at the follow up, please ask your Primary MD to get all Hospital records sent to his/her office.   In some cases, they will be blood work, cultures and biopsy results pending at the time of your discharge. Please request that your primary care M.D. goes through all the records of your hospital data and follows up on these results.   Also Note the following: If you experience worsening of your admission symptoms, develop shortness of breath, life threatening emergency, suicidal or homicidal thoughts you must seek medical attention immediately by calling 911 or calling your MD immediately  if symptoms less severe.   You must read complete instructions/literature along with all the possible adverse reactions/side effects for all the Medicines you take and that have been prescribed to you. Take any new Medicines after you have completely understood and accpet all the possible adverse reactions/side effects.    Do not drive when taking Pain medications or sleeping medications (Benzodaizepines)   Do not take more than prescribed Pain, Sleep and Anxiety Medications. It is not advisable to combine anxiety,sleep and pain medications without talking with your primary care practitioner   Special Instructions: If you have smoked or chewed Tobacco  in the last 2 yrs please stop smoking, stop any regular Alcohol   and or any Recreational drug use.   Wear Seat belts while driving.   Please note: You were cared for by a hospitalist during your hospital stay. Once you are discharged, your primary care physician will handle any further medical issues. Please note that NO REFILLS for any discharge medications will be authorized once you are discharged, as it is imperative that you return to your primary care physician (or establish a relationship with a primary care physician if you do not have one) for your post hospital discharge needs so that they can reassess your need for  medications and monitor your lab values  Time coordinating discharge: Over 30 minutes  SIGNED:   Modena Andes, MD  [facility] 03/22/2024, 10:37 AM *Please note that this is a verbal dictation therefore any spelling or grammatical errors are due to the "[employer] One" system interpretation. If 7PM-7AM, please contact night-coverage [email or web address]

## 2024-03-22 NOTE — Plan of Care (Signed)
  Problem: Education: Goal: Knowledge of General Education information will improve Description: Including pain rating scale, medication(s)/side effects and non-pharmacologic comfort measures Outcome: Progressing   Problem: Clinical Measurements: Goal: Ability to maintain clinical measurements within normal limits will improve Outcome: Progressing Goal: Will remain free from infection Outcome: Progressing Goal: Respiratory complications will improve Outcome: Progressing   Problem: Activity: Goal: Risk for activity intolerance will decrease Outcome: Progressing   Problem: Elimination: Goal: Will not experience complications related to urinary retention Outcome: Progressing   Problem: Pain Managment: Goal: General experience of comfort will improve and/or be controlled Outcome: Progressing   Problem: Skin Integrity: Goal: Risk for impaired skin integrity will decrease Outcome: Progressing

## 2024-03-22 NOTE — Plan of Care (Signed)

## 2024-04-03 ENCOUNTER — Other Ambulatory Visit: Payer: Self-pay | Admitting: Family Medicine

## 2024-04-03 DIAGNOSIS — Z1231 Encounter for screening mammogram for malignant neoplasm of breast: Secondary | ICD-10-CM

## 2024-04-21 ENCOUNTER — Ambulatory Visit: Admitting: Nurse Practitioner

## 2024-04-21 ENCOUNTER — Encounter: Payer: Self-pay | Admitting: Nurse Practitioner

## 2024-04-21 ENCOUNTER — Ambulatory Visit
Admission: RE | Admit: 2024-04-21 | Discharge: 2024-04-21 | Disposition: A | Discharge status: Home / Self Care | Source: Ambulatory Visit | Attending: Family Medicine | Admitting: Family Medicine

## 2024-04-21 ENCOUNTER — Other Ambulatory Visit

## 2024-04-21 VITALS — BP 122/60 | HR 77 | Ht 62.0 in | Wt 154.0 lb

## 2024-04-21 DIAGNOSIS — R933 Abnormal findings on diagnostic imaging of other parts of digestive tract: Secondary | ICD-10-CM

## 2024-04-21 DIAGNOSIS — R1310 Dysphagia, unspecified: Secondary | ICD-10-CM

## 2024-04-21 DIAGNOSIS — K449 Diaphragmatic hernia without obstruction or gangrene: Secondary | ICD-10-CM

## 2024-04-21 DIAGNOSIS — Z1231 Encounter for screening mammogram for malignant neoplasm of breast: Secondary | ICD-10-CM

## 2024-04-21 DIAGNOSIS — D649 Anemia, unspecified: Secondary | ICD-10-CM | POA: Diagnosis not present

## 2024-04-21 DIAGNOSIS — K224 Dyskinesia of esophagus: Secondary | ICD-10-CM | POA: Diagnosis not present

## 2024-04-21 DIAGNOSIS — Z860101 Personal history of adenomatous and serrated colon polyps: Secondary | ICD-10-CM

## 2024-04-21 LAB — CBC
HCT: 37.1 % (ref 36.0–46.0)
Hemoglobin: 12.4 g/dL (ref 12.0–15.0)
MCHC: 33.5 g/dL (ref 30.0–36.0)
MCV: 86.2 fl (ref 78.0–100.0)
Platelets: 229 10*3/uL (ref 150.0–400.0)
RBC: 4.3 Mil/uL (ref 3.87–5.11)
RDW: 13.6 % (ref 11.5–15.5)
WBC: 5.6 10*3/uL (ref 4.0–10.5)

## 2024-04-21 LAB — BASIC METABOLIC PANEL WITH GFR
BUN: 28 mg/dL — ABNORMAL HIGH (ref 6–23)
CO2: 29 meq/L (ref 19–32)
Calcium: 9.3 mg/dL (ref 8.4–10.5)
Chloride: 107 meq/L (ref 96–112)
Creatinine, Ser: 1.58 mg/dL — ABNORMAL HIGH (ref 0.40–1.20)
GFR: 31.11 mL/min — ABNORMAL LOW (ref >60.00)
Glucose, Bld: 87 mg/dL (ref 70–99)
Potassium: 4.1 meq/L (ref 3.5–5.1)
Sodium: 144 meq/L (ref 135–145)

## 2024-04-21 NOTE — Progress Notes (Signed)
 Kelly Mooney, pls contact patient and let her know Dr. Elvin Hammer reviewed her recent hospital evaluation and he recommended scheduling her for an upper endoscopy with possible esophageal dilatation and [facility].  Please schedule her for an EGD if she is willing to do so.  Thank you.

## 2024-04-21 NOTE — Patient Instructions (Addendum)
 Your provider has requested that you go to the basement level for lab work before leaving today. Press "B" on the elevator. The lab is located at the first door on the left as you exit the elevator.  Continue Pantoprazole  40 mg twice daily.  Please purchase the following medications over the counter and take as directed: Gaviscon take three times daily as needed  Please reduce your Tums intake.  Please follow up sooner if symptoms increase or worsen  Due to recent changes in healthcare laws, you may see the results of your imaging and laboratory studies on MyChart before your provider has had a chance to review them.  We understand that in some cases there may be results that are confusing or concerning to you. Not all laboratory results come back in the same time frame and the provider may be waiting for multiple results in order to interpret others.  Please give us  48 hours in order for your provider to thoroughly review all the results before contacting the office for clarification of your results.   Thank you for trusting me with your gastrointestinal care!   Everett Hitt, NP _______________________________________________________  If your blood pressure at your visit was 140/90 or greater, please contact your primary care physician to follow up on this.  _______________________________________________________  If you are age 40 or older, your body mass index should be between 23-30. Your Body mass index is 28.17 kg/m. If this is out of the aforementioned range listed, please consider follow up with your Primary Care Provider.  If you are age 48 or younger, your body mass index should be between 19-25. Your Body mass index is 28.17 kg/m. If this is out of the aformentioned range listed, please consider follow up with your Primary Care Provider.   ________________________________________________________  The [facility] providers would like to encourage you to use MYCHART to  communicate with providers for non-urgent requests or questions.  Due to long hold times on the telephone, sending your provider a message by [facility] may be a faster and more efficient way to get a response.  Please allow 48 business hours for a response.  Please remember that this is for non-urgent requests.  _______________________________________________________

## 2024-04-21 NOTE — Progress Notes (Signed)
 Noted. Colleen, Please schedule EGD with possible balloon dilation in LEC. Thanks, Dr. Elvin Hammer

## 2024-04-21 NOTE — Progress Notes (Signed)
 04/21/2024 Kelly Mooney [id number] [birth year]   Chief Complaint: N/V, esophageal pain   History of Present Illness: Kelly Mooney is a 78 y.o. female with a history of HTN, HLD, COPD, diabetes, hypothyroidism, GERD, large hiatal hernia, presenting with esophageal/chest pain, nausea and vomiting.  She was admitted to the hospital 03/19/2024 with sudden onset of severe chest pain with nausea and vomiting. Troponin levels were initially mildly elevated and subsequent levels were normal.  EKG without acute ischemia and TTE was normal. CTAP without contrast identified a large hiatal hernia containing the proximal stomach, wall thickening of the distal esophagus and gastroesophageal junction with a small amount of free fluid surrounding the distal esophagus worrisome for esophagitis. Case was discussed with cardiothoracic surgery who recommend an esophagram which showed a tortuous esophagus with mild dysmotility and similar appearing distal esophageal stricture at the GE junction, a large hiatal hernia without evidence of an obstruction, perforation or extravasation.  She was evaluated by our inpatient GI service who recommended high-dose IV PPI and endoscopic evaluation was deferred as it was thought management would not change and outpatient follow-up was recommended.  She was also noted to have normocytic anemia with Hg level 11.8 -> 10.0,  IV fluids with dilutional component and CKD likely contributing factors.  No overt GI bleeding.  No documented history of Cameron erosions in setting of the large hiatal hernia.  Her clinical status stabilized and she was discharged home 03/22/2024 Pantoprazole  40 mg twice daily.  She is well-known by Dr. Elvin Hammer.  She has a known longstanding history of GERD complicated by large hiatal hernia and peptic stricture, requiring esophageal dilation in the past. Most recent EGD was 10/2023 with large hiatal hernia, stricture at the GE junction dilated with 20 mm TTS  balloon. Prior to that, EGD in 05/2020 with stricture in the distal esophagus, again dilated with 20 mm TTS balloon, along with large hiatal hernia. Reflux otherwise had been largely well-controlled with Protonix  40 mg daily. Esophageal manometry 2016 with normal motility.   Since she was discharged from the hospital 03/22/2024, she continues to have increased gas with belching and describes having esophageal spasms every time she eats.  When eating any solid food or drinking liquids, she thinks about every swallow.  If she feels her throat will not except food or liquid she waits a few seconds until she feels she can swallow.  She has heartburn for which she remains on pantoprazole  40 mg twice daily but is also taking 4 Tums daily.  She has nausea without any further vomiting since she was discharged home.  No NSAID use.  She is passing normal bowel movements.  No bloody or black stools.  Her most recent colonoscopy was 05/23/2020 which identified one 2 mm tubular adenomatous polyp removed from the transverse colon and diverticulosis throughout the colon.  No further colon polyp surveillance colonoscopies were recommended due to age.      Latest Ref Rng & Units 03/22/2024    3:54 AM 03/21/2024    5:01 AM 03/20/2024    2:01 PM  CBC  WBC 4.0 - 10.5 K/uL 5.3  6.2  8.0   Hemoglobin 12.0 - 15.0 g/dL 56.2  13.0  86.5   Hematocrit 36.0 - 46.0 % 29.9  30.9  34.1   Platelets 150 - 400 K/uL 184  190  219        Latest Ref Rng & Units 03/22/2024    3:54 AM 03/21/2024  5:01 AM 03/20/2024    2:01 PM  CMP  Glucose 70 - 99 mg/dL 161  096  045   BUN 8 - 23 mg/dL 16  34  37   Creatinine 0.44 - 1.00 mg/dL 4.09  8.11  9.14   Sodium 135 - 145 mmol/L 141  142  144   Potassium 3.5 - 5.1 mmol/L 3.4  4.0  4.6   Chloride 98 - 111 mmol/L 110  109  109   CO2 22 - 32 mmol/L 24  24  24    Calcium  8.9 - 10.3 mg/dL 8.7  8.5  9.5     CT chest/abdomen/pelvis without contrast 03/19/2024: FINDINGS: CT CHEST FINDINGS    Cardiovascular: No significant vascular findings. Normal heart size. No pericardial effusion. There are atherosclerotic calcifications of the aorta and coronary arteries.   Mediastinum/Nodes: There is a large hiatal hernia containing the proximal stomach, unchanged. There is wall thickening of the distal esophagus and gastroesophageal junction. There is no pneumomediastinum, but there is a small amount of free fluid surrounding the distal esophagus. The esophagus is nondilated. No enlarged lymph nodes are seen. Thyroid  gland is not visualized.   Lungs/Pleura: There is scattered peripheral reticular ground-glass opacities throughout both lungs similar to the prior study. There is scarring or atelectasis in the lingula, also unchanged. Calcified granulomas are seen. There is a noncalcified nodule in the left lower lobe measuring 3 mm image 6/95 which is unchanged. There is no focal lung infiltrate, pleural effusion or pneumothorax.   Musculoskeletal: No acute osseous abnormality.   CT ABDOMEN PELVIS FINDINGS   Hepatobiliary: There are rounded hypodensities in the liver which are unchanged, likely cysts. These measure up to 14 mm. The gallbladder surgically absent. There is no biliary ductal dilatation.   Pancreas: Unremarkable. No pancreatic ductal dilatation or surrounding inflammatory changes.   Spleen: Normal in size without focal abnormality.   Adrenals/Urinary Tract: Adrenal glands are unremarkable. Kidneys are normal, without renal calculi or hydronephrosis. There is a cyst in the superior pole the left kidney measuring 9 mm. Bladder is unremarkable.   Stomach/Bowel: Stomach is within normal limits. Appendix appears normal. No evidence of bowel wall thickening, distention, or inflammatory changes. There is sigmoid and descending colon diverticulosis.   Vascular/Lymphatic: Aortic atherosclerosis. No enlarged abdominal or pelvic lymph nodes.   Reproductive: Status  post hysterectomy. No adnexal masses.   Other: No abdominal wall hernia or abnormality. No abdominopelvic ascites.   Musculoskeletal: No acute or significant osseous findings.   IMPRESSION: 1. Large hiatal hernia containing the proximal stomach, unchanged. 2. Wall thickening of the distal esophagus and gastroesophageal junction with small amount of free fluid surrounding the distal esophagus. Findings are worrisome for esophagitis. Underlying neoplastic process is not excluded. 3. No pneumomediastinum. 4. No acute localizing process in the abdomen or pelvis. 5. Colonic diverticulosis. 6. Stable 3 mm left lower lobe pulmonary nodule. No follow-up needed if patient is low-risk.This recommendation follows the consensus statement: Guidelines for Management of Incidental Pulmonary Nodules Detected on CT Images: From the Fleischner Society 2017; Radiology 2017; 284:228-243. 7. Stable peripheral reticular ground-glass opacities throughout both lungs, possibly chronic interstitial lung disease. 8. Aortic atherosclerosis.  Aortic Atherosclerosis   Barium swallow study 03/20/2024: FINDINGS: Swallowing: Appears normal. No vestibular penetration or aspiration seen.   Pharynx: Unremarkable.   Esophagus: Tortuous lower third of the esophagus with a similar distal stricture at the GE junction just proximal to hiatal hernia.   Esophageal motility: Mild dysmotility with intermittent proximal escape.  Hiatal Hernia: Large hiatal hernia.   Other: Barium draining appropriately through the GE junction into the stomach. No evidence of perforation or extravasation. Evaluation performed at 40 degree table tilt due to shortness of breath on standing.   IMPRESSION: Tortuous esophagus with mild dysmotility and similar appearing distal esophageal stricture at the GE junction. No obstruction.   Large hiatal hernia.  Current Outpatient Medications on File Prior to Visit  Medication Sig  Dispense Refill   albuterol  (PROVENTIL  HFA;VENTOLIN  HFA) 108 (90 BASE) MCG/ACT inhaler Inhale 2 puffs into the lungs 4 (four) times daily.      albuterol  (PROVENTIL ) (2.5 MG/3ML) 0.083% nebulizer solution Take 2.5 mg by nebulization every 6 (six) hours as needed for wheezing or shortness of breath.     aspirin  EC 81 MG tablet Take 1 tablet (81 mg total) by mouth daily. Swallow whole. (Patient taking differently: Take 81 mg by mouth every 7 (seven) days. Swallow whole.) 90 tablet 3   atorvastatin  (LIPITOR) 40 MG tablet Take 1 tablet (40 mg total) by mouth daily. (Patient taking differently: Take 40 mg by mouth at bedtime.) 90 tablet 3   benzonatate  (TESSALON ) 100 MG capsule Take 1 capsule (100 mg total) by mouth every 6 (six) hours as needed for cough. 30 capsule 1   Budeson-Glycopyrrol-Formoterol  (BREZTRI  AEROSPHERE) 160-9-4.8 MCG/ACT AERO INHALE 2 PUFFS BY MOUTH INTO THE LUNGS IN THE MORNING AND AT BEDTIME 32.1 g 3   carboxymethylcellulose (ARTIFICIAL TEARS) 1 % ophthalmic solution Place 1 drop into both eyes 3 (three) times daily.     chlorthalidone (HYGROTON) 25 MG tablet Take 25 mg by mouth daily.      clobetasol cream (TEMOVATE) 0.05 % Apply 1 application  topically See admin instructions. Apply a thin layer to affected areas one to three times a day  4   fenofibrate  160 MG tablet Take 160 mg by mouth daily.     hydrOXYzine (ATARAX/VISTARIL) 25 MG tablet Take 25 mg by mouth every 8 (eight) hours as needed for anxiety.     irbesartan (AVAPRO) 300 MG tablet Take 300 mg by mouth daily.     levothyroxine  (SYNTHROID ) 125 MCG tablet Take 125 mcg by mouth daily before breakfast.     magnesium oxide (MAG-OX) 400 (240 Mg) MG tablet Take 400 mg by mouth 2 (two) times daily.     montelukast  (SINGULAIR ) 10 MG tablet Take 10 mg by mouth at bedtime.  4   MOUNJARO  2.5 MG/0.5ML Pen Inject 2.5 mg into the skin every Wednesday.     pantoprazole  (PROTONIX ) 40 MG tablet Take 1 tablet (40 mg total) by mouth 2 (two)  times daily. 60 tablet 0   valACYclovir (VALTREX) 1000 MG tablet Take 2,000 mg by mouth daily as needed (for cold sores- take as directed).     No current facility-administered medications on file prior to visit.   Allergies  Allergen Reactions   Chloramphenicol Anaphylaxis and Other (See Comments)    Chloramphenicol is a medication used in the management and treatment of superficial eye infections such as bacterial conjunctivitis, and otitis externa.    Ivp Dye [Iodinated Contrast Media] Anaphylaxis, Shortness Of Breath and Other (See Comments)    SEVERE convulsions, too    Crestor  [Rosuvastatin  Calcium ] Other (See Comments)    Muscle cramps   Iodine Itching and Other (See Comments)    Topical version only does this   Sulfa Antibiotics Rash   Current Medications, Allergies, Past Medical History, Past Surgical History, Family History and Social  History were reviewed in [facility] electronic medical record.  Review of Systems:   Constitutional: Negative for fever, sweats, chills or weight loss.  Respiratory: Negative for shortness of breath.   Cardiovascular: See HPI. Gastrointestinal: See HPI.  Musculoskeletal: Negative for back pain or muscle aches.  Neurological: Negative for dizziness, headaches or paresthesias.   Physical Exam: BP 122/60   Pulse 77   Ht 5\' 2"  (1.575 m)   Wt 154 lb (69.9 kg)   BMI 28.17 kg/m  Wt Readings from Last 3 Encounters:  04/21/24 154 lb (69.9 kg)  03/20/24 152 lb 1.9 oz (69 kg)  10/27/23 135 lb (61.2 kg)    General: 78 year old female in no acute distress. Head: Normocephalic and atraumatic. Eyes: No scleral icterus. Conjunctiva pink . Ears: Normal auditory acuity. Mouth: Dentition intact. No ulcers or lesions.  Lungs: Clear throughout to auscultation. Heart: Regular rate and rhythm, no murmur. Abdomen: Soft, nondistended. Mild epigastric tenderness. No masses or hepatomegaly. Normal bowel sounds x 4 quadrants.  Rectal: Deferred.   Musculoskeletal: Symmetrical with no gross deformities. Extremities: No edema. Neurological: Alert oriented x 4. No focal deficits.  Psychological: Alert and cooperative. Normal mood and affect  Assessment and Recommendations:  78 year old female with a history of GERD, a large hiatal hernia and a tortuous esophagus who was recently admitted to the hospital 03/19/2024 with chest/esophageal pain, nausea and vomiting.  Cardiac workup was unrevealing. CTAP without contrast identified a large hiatal hernia containing the proximal stomach, wall thickening of the distal esophagus and gastroesophageal junction with a small amount of free fluid surrounding the distal esophagus worrisome for esophagitis.  Barium swallow study showed a tortuous esophagus with mild dysmotility and similar appearing distal esophageal stricture at the GE junction, a large hiatal hernia without evidence of an obstruction, perforation or extravasation.  She continues to have daily episodes of esophageal spasms with dysphagia, no further nausea or vomiting.  No abdominal pain. - Dr. Elvin Hammer to review case to verify if the repeat EGD warranted at this juncture - Consider outpatient follow-up with thoracic surgery to consider benefit versus risk of hiatal hernia surgery - Continue Pantoprazole  40 mg twice daily - Reduce Tums intake - May take Gaviscon 1 tablespoon 3 times daily as needed - Eat 4 small snack size meals daily, avoid eating 3 to 4 hours before bedtime  Chronic normocytic anemia, multifactorial: CKD a contributing factor.  No prior history of upper GI bleed or Cameron lesions in setting of a large hiatal hernia.  Hemoglobin 10.0.  MCV 86.9. - CBC, IBC + ferritin, B12 and folate   History of colon polyps. Her most recent colonoscopy 05/23/2020 which identified one 2 mm tubular adenomatous polyp removed from the transverse colon and diverticulosis throughout the colon.  - No further colon polyp surveillance colonoscopies  were recommended due to age.

## 2024-04-26 ENCOUNTER — Telehealth: Payer: Self-pay | Admitting: Nurse Practitioner

## 2024-04-26 NOTE — Progress Notes (Signed)
 Heavenly, please keep track of this patient and try again to contact her to schedule an EGD as noted per prior message below.  Please let me know when scheduled.  Thank you

## 2024-04-26 NOTE — Telephone Encounter (Signed)
 Patient called  and stated that she was returning a call over to [facility]. Patient is requesting a call back. Please advise.

## 2024-04-27 LAB — B12 AND FOLATE PANEL
Folate: 7.6 ng/mL (ref >5.9)
Vitamin B-12: 139 pg/mL — ABNORMAL LOW (ref 211–911)

## 2024-04-27 LAB — IBC + FERRITIN
Ferritin: 11.9 ng/mL (ref 10.0–291.0)
Iron: 64 ug/dL (ref 42–145)
Saturation Ratios: 13.4 % — ABNORMAL LOW (ref 20.0–50.0)
TIBC: 476 ug/dL — ABNORMAL HIGH (ref 250.0–450.0)
Transferrin: 340 mg/dL (ref 212.0–360.0)

## 2024-04-28 ENCOUNTER — Ambulatory Visit: Payer: Self-pay | Admitting: Nurse Practitioner

## 2024-04-28 NOTE — Addendum Note (Signed)
 Addended by: Lanora Plana on: 04/28/2024 09:14 AM   Modules accepted: Orders

## 2024-06-08 ENCOUNTER — Ambulatory Visit: Admitting: Internal Medicine

## 2024-06-08 ENCOUNTER — Encounter: Payer: Self-pay | Admitting: Internal Medicine

## 2024-06-08 VITALS — BP 112/49 | HR 78 | Temp 97.7°F | Resp 19 | Ht 62.0 in | Wt 154.0 lb

## 2024-06-08 DIAGNOSIS — K219 Gastro-esophageal reflux disease without esophagitis: Secondary | ICD-10-CM | POA: Diagnosis not present

## 2024-06-08 DIAGNOSIS — K222 Esophageal obstruction: Secondary | ICD-10-CM

## 2024-06-08 DIAGNOSIS — K449 Diaphragmatic hernia without obstruction or gangrene: Secondary | ICD-10-CM

## 2024-06-08 DIAGNOSIS — D649 Anemia, unspecified: Secondary | ICD-10-CM

## 2024-06-08 DIAGNOSIS — R1310 Dysphagia, unspecified: Secondary | ICD-10-CM

## 2024-06-08 MED ORDER — SODIUM CHLORIDE 0.9 % IV SOLN: 500.0000 mL | INTRAVENOUS | Status: DC

## 2024-06-08 NOTE — Op Note (Signed)
 [facility] Patient Name: Kelly Mooney Procedure Date: 06/08/2024 12:39 PM MRN: [id number] Endoscopist: Norleen SAILOR. Abran , MD, [id number] Age: 78 Referring MD:  Date of Birth: [birth year] Gender: Female Account #: [id number] Procedure:                Upper GI endoscopy with balloon dilation of the                            esophagus?"20 mm max; biopsies Indications:              Dysphagia, , Therapeutic procedure, Esophageal                            reflux, therapeutic procedure, GERD Medicines:                Monitored Anesthesia Care Procedure:                Pre-Anesthesia Assessment:                           - Prior to the procedure, a History and Physical                            was performed, and patient medications and                            allergies were reviewed. The patient's tolerance of                            previous anesthesia was also reviewed. The risks                            and benefits of the procedure and the sedation                            options and risks were discussed with the patient.                            All questions were answered, and informed consent                            was obtained. Prior Anticoagulants: The patient has                            taken no anticoagulant or antiplatelet agents. ASA                            Grade Assessment: II - A patient with mild systemic                            disease. After reviewing the risks and benefits,                            the patient was deemed in satisfactory condition to  undergo the procedure.                           After obtaining informed consent, the endoscope was                            passed under direct vision. Throughout the                            procedure, the patient's blood pressure, pulse, and                            oxygen  saturations were monitored continuously. The                            GIF  [id number] #[id number] was introduced through the                            mouth, and advanced to the second part of duodenum.                            The upper GI endoscopy was accomplished without                            difficulty. The patient tolerated the procedure                            well. Scope In: Scope Out: Findings:                 The esophagus was tortuous and foreshortened.                           One benign-appearing, intrinsic moderate stenosis                            was found 30 cm from the incisors. This stenosis                            measured 1.4 cm (inner diameter). It was ringlike                            and fibrotic. This was subsequently dilated with                            sequential balloons from 18 to 20 mm max. The                            diameter of the stricture improved, but no ring                            disruption. Thus, a biopsy forceps was taken to                            take multiple  bites from the fibrotic stricture.                           The stomach revealed a large hiatal hernia                            measuring about 6 cm.                           The examined duodenum was normal.                           The cardia and gastric fundus were normal on                            retroflexion, save hiatal hernia. Complications:            No immediate complications. Estimated Blood Loss:     Estimated blood loss: none. Impression:               1. GERD with esophageal stricture status post                            balloon dilation followed by biopsy of ring                            disruption                           2. Large hiatal hernia. Recommendation:           - Patient has a contact number available for                            emergencies. The signs and symptoms of potential                            delayed complications were discussed with the                            patient. Return to  normal activities tomorrow.                            Written discharge instructions were provided to the                            patient.                           - Post dilation diet.                           - Continue present medications                           - Please schedule repeat EGD with dilation in the  LEC in 4 to 6 weeks. Please have triamcinolone                            (Kenalog) available for stricture injection Norleen SAILOR. Abran, MD 06/08/2024 2:31:10 PM This report has been signed electronically.

## 2024-06-08 NOTE — Progress Notes (Signed)
 Pt's states no medical or surgical changes since previsit or office visit.

## 2024-06-08 NOTE — Progress Notes (Signed)
 Report to PACU, RN, vss, BBS= Clear.

## 2024-06-08 NOTE — Progress Notes (Signed)
 Called to room to assist during endoscopic procedure.  Patient ID and intended procedure confirmed with present staff. Received instructions for my participation in the procedure from the performing physician.

## 2024-06-08 NOTE — Patient Instructions (Addendum)
 Follow Post dilation handout.  Repeat EGD scheduled.  YOU HAD AN ENDOSCOPIC PROCEDURE TODAY AT THE [facility]:   Refer to the procedure report that was given to you for any specific questions about what was found during the examination.  If the procedure report does not answer your questions, please call your gastroenterologist to clarify.  If you requested that your care partner not be given the details of your procedure findings, then the procedure report has been included in a sealed envelope for you to review at your convenience later.  YOU SHOULD EXPECT: Some feelings of bloating in the abdomen. Passage of more gas than usual.  Walking can help get rid of the air that was put into your GI tract during the procedure and reduce the bloating. If you had a lower endoscopy (such as a colonoscopy or flexible sigmoidoscopy) you may notice spotting of blood in your stool or on the toilet paper. If you underwent a bowel prep for your procedure, you may not have a normal bowel movement for a few days.  Please Note:  You might notice some irritation and congestion in your nose or some drainage.  This is from the oxygen  used during your procedure.  There is no need for concern and it should clear up in a day or so.  SYMPTOMS TO REPORT IMMEDIATELY:  Following upper endoscopy (EGD)  Vomiting of blood or coffee ground material  New chest pain or pain under the shoulder blades  Painful or persistently difficult swallowing  New shortness of breath  Fever of 100F or higher  Black, tarry-looking stools  For urgent or emergent issues, a gastroenterologist can be reached at any hour by calling [phone]. Do not use MyChart messaging for urgent concerns.    DIET:  Follow post dilation handout, but then you may proceed to your regular diet.  Drink plenty of fluids but you should avoid alcoholic beverages for 24 hours.  ACTIVITY:  You should plan to take it easy for the rest of today and you  should NOT DRIVE or use heavy machinery until tomorrow (because of the sedation medicines used during the test).    FOLLOW UP: Our staff will call the number listed on your records the next business day following your procedure.  We will call around 7:15- 8:00 am to check on you and address any questions or concerns that you may have regarding the information given to you following your procedure. If we do not reach you, we will leave a message.     If any biopsies were taken you will be contacted by phone or by letter within the next 1-3 weeks.  Please call us  at [phone] if you have not heard about the biopsies in 3 weeks.    SIGNATURES/CONFIDENTIALITY: You and/or your care partner have signed paperwork which will be entered into your electronic medical record.  These signatures attest to the fact that that the information above on your After Visit Summary has been reviewed and is understood.  Full responsibility of the confidentiality of this discharge information lies with you and/or your care-partner.

## 2024-06-08 NOTE — Progress Notes (Signed)
 Expand All Collapse All        04/21/2024 RENNEE Mooney [id number] [birth year]     Chief Complaint: N/V, esophageal pain    History of Present Illness: Kelly Mooney is a 78 y.o. female with a history of HTN, HLD, COPD, diabetes, hypothyroidism, GERD, large hiatal hernia, presenting with esophageal/chest pain, nausea and vomiting.  She was admitted to the hospital 03/19/2024 with sudden onset of severe chest pain with nausea and vomiting. Troponin levels were initially mildly elevated and subsequent levels were normal.  EKG without acute ischemia and TTE was normal. CTAP without contrast identified a large hiatal hernia containing the proximal stomach, wall thickening of the distal esophagus and gastroesophageal junction with a small amount of free fluid surrounding the distal esophagus worrisome for esophagitis. Case was discussed with cardiothoracic surgery who recommend an esophagram which showed a tortuous esophagus with mild dysmotility and similar appearing distal esophageal stricture at the GE junction, a large hiatal hernia without evidence of an obstruction, perforation or extravasation.  She was evaluated by our inpatient GI service who recommended high-dose IV PPI and endoscopic evaluation was deferred as it was thought management would not change and outpatient follow-up was recommended.  She was also noted to have normocytic anemia with Hg level 11.8 -> 10.0,  IV fluids with dilutional component and CKD likely contributing factors.  No overt GI bleeding.  No documented history of Cameron erosions in setting of the large hiatal hernia.  Her clinical status stabilized and she was discharged home 03/22/2024 Pantoprazole  40 mg twice daily.   She is well-known by Dr. Abran.  She has a known longstanding history of GERD complicated by large hiatal hernia and peptic stricture, requiring esophageal dilation in the past. Most recent EGD was 10/2023 with large hiatal hernia, stricture at the GE  junction dilated with 20 mm TTS balloon. Prior to that, EGD in 05/2020 with stricture in the distal esophagus, again dilated with 20 mm TTS balloon, along with large hiatal hernia. Reflux otherwise had been largely well-controlled with Protonix  40 mg daily. Esophageal manometry 2016 with normal motility.    Since she was discharged from the hospital 03/22/2024, she continues to have increased gas with belching and describes having esophageal spasms every time she eats.  When eating any solid food or drinking liquids, she thinks about every swallow.  If she feels her throat will not except food or liquid she waits a few seconds until she feels she can swallow.  She has heartburn for which she remains on pantoprazole  40 mg twice daily but is also taking 4 Tums daily.  She has nausea without any further vomiting since she was discharged home.  No NSAID use.  She is passing normal bowel movements.  No bloody or black stools.  Her most recent colonoscopy was 05/23/2020 which identified one 2 mm tubular adenomatous polyp removed from the transverse colon and diverticulosis throughout the colon.  No further colon polyp surveillance colonoscopies were recommended due to age.         Latest Ref Rng & Units 03/22/2024    3:54 AM 03/21/2024    5:01 AM 03/20/2024    2:01 PM  CBC  WBC 4.0 - 10.5 K/uL 5.3  6.2  8.0   Hemoglobin 12.0 - 15.0 g/dL 89.9  89.8  88.9   Hematocrit 36.0 - 46.0 % 29.9  30.9  34.1   Platelets 150 - 400 K/uL 184  190  219  Latest Ref Rng & Units 03/22/2024    3:54 AM 03/21/2024    5:01 AM 03/20/2024    2:01 PM  CMP  Glucose 70 - 99 mg/dL 890  888  899   BUN 8 - 23 mg/dL 16  34  37   Creatinine 0.44 - 1.00 mg/dL 8.81  8.36  7.99   Sodium 135 - 145 mmol/L 141  142  144   Potassium 3.5 - 5.1 mmol/L 3.4  4.0  4.6   Chloride 98 - 111 mmol/L 110  109  109   CO2 22 - 32 mmol/L 24  24  24    Calcium  8.9 - 10.3 mg/dL 8.7  8.5  9.5     CT chest/abdomen/pelvis without contrast  03/19/2024: FINDINGS: CT CHEST FINDINGS   Cardiovascular: No significant vascular findings. Normal heart size. No pericardial effusion. There are atherosclerotic calcifications of the aorta and coronary arteries.   Mediastinum/Nodes: There is a large hiatal hernia containing the proximal stomach, unchanged. There is wall thickening of the distal esophagus and gastroesophageal junction. There is no pneumomediastinum, but there is a small amount of free fluid surrounding the distal esophagus. The esophagus is nondilated. No enlarged lymph nodes are seen. Thyroid  gland is not visualized.   Lungs/Pleura: There is scattered peripheral reticular ground-glass opacities throughout both lungs similar to the prior study. There is scarring or atelectasis in the lingula, also unchanged. Calcified granulomas are seen. There is a noncalcified nodule in the left lower lobe measuring 3 mm image 6/95 which is unchanged. There is no focal lung infiltrate, pleural effusion or pneumothorax.   Musculoskeletal: No acute osseous abnormality.   CT ABDOMEN PELVIS FINDINGS   Hepatobiliary: There are rounded hypodensities in the liver which are unchanged, likely cysts. These measure up to 14 mm. The gallbladder surgically absent. There is no biliary ductal dilatation.   Pancreas: Unremarkable. No pancreatic ductal dilatation or surrounding inflammatory changes.   Spleen: Normal in size without focal abnormality.   Adrenals/Urinary Tract: Adrenal glands are unremarkable. Kidneys are normal, without renal calculi or hydronephrosis. There is a cyst in the superior pole the left kidney measuring 9 mm. Bladder is unremarkable.   Stomach/Bowel: Stomach is within normal limits. Appendix appears normal. No evidence of bowel wall thickening, distention, or inflammatory changes. There is sigmoid and descending colon diverticulosis.   Vascular/Lymphatic: Aortic atherosclerosis. No enlarged abdominal or pelvic  lymph nodes.   Reproductive: Status post hysterectomy. No adnexal masses.   Other: No abdominal wall hernia or abnormality. No abdominopelvic ascites.   Musculoskeletal: No acute or significant osseous findings.   IMPRESSION: 1. Large hiatal hernia containing the proximal stomach, unchanged. 2. Wall thickening of the distal esophagus and gastroesophageal junction with small amount of free fluid surrounding the distal esophagus. Findings are worrisome for esophagitis. Underlying neoplastic process is not excluded. 3. No pneumomediastinum. 4. No acute localizing process in the abdomen or pelvis. 5. Colonic diverticulosis. 6. Stable 3 mm left lower lobe pulmonary nodule. No follow-up needed if patient is low-risk.This recommendation follows the consensus statement: Guidelines for Management of Incidental Pulmonary Nodules Detected on CT Images: From the Fleischner Society 2017; Radiology 2017; 284:228-243. 7. Stable peripheral reticular ground-glass opacities throughout both lungs, possibly chronic interstitial lung disease. 8. Aortic atherosclerosis.  Aortic Atherosclerosis    Barium swallow study 03/20/2024: FINDINGS: Swallowing: Appears normal. No vestibular penetration or aspiration seen.   Pharynx: Unremarkable.   Esophagus: Tortuous lower third of the esophagus with a similar distal stricture at the GE  junction just proximal to hiatal hernia.   Esophageal motility: Mild dysmotility with intermittent proximal escape.   Hiatal Hernia: Large hiatal hernia.   Other: Barium draining appropriately through the GE junction into the stomach. No evidence of perforation or extravasation. Evaluation performed at 40 degree table tilt due to shortness of breath on standing.   IMPRESSION: Tortuous esophagus with mild dysmotility and similar appearing distal esophageal stricture at the GE junction. No obstruction.   Large hiatal hernia.   Medications Ordered Prior to Encounter         Current Outpatient Medications on File Prior to Visit  Medication Sig Dispense Refill   albuterol  (PROVENTIL  HFA;VENTOLIN  HFA) 108 (90 BASE) MCG/ACT inhaler Inhale 2 puffs into the lungs 4 (four) times daily.        albuterol  (PROVENTIL ) (2.5 MG/3ML) 0.083% nebulizer solution Take 2.5 mg by nebulization every 6 (six) hours as needed for wheezing or shortness of breath.       aspirin  EC 81 MG tablet Take 1 tablet (81 mg total) by mouth daily. Swallow whole. (Patient taking differently: Take 81 mg by mouth every 7 (seven) days. Swallow whole.) 90 tablet 3   atorvastatin  (LIPITOR) 40 MG tablet Take 1 tablet (40 mg total) by mouth daily. (Patient taking differently: Take 40 mg by mouth at bedtime.) 90 tablet 3   benzonatate  (TESSALON ) 100 MG capsule Take 1 capsule (100 mg total) by mouth every 6 (six) hours as needed for cough. 30 capsule 1   Budeson-Glycopyrrol-Formoterol  (BREZTRI  AEROSPHERE) 160-9-4.8 MCG/ACT AERO INHALE 2 PUFFS BY MOUTH INTO THE LUNGS IN THE MORNING AND AT BEDTIME 32.1 g 3   carboxymethylcellulose (ARTIFICIAL TEARS) 1 % ophthalmic solution Place 1 drop into both eyes 3 (three) times daily.       chlorthalidone (HYGROTON) 25 MG tablet Take 25 mg by mouth daily.        clobetasol cream (TEMOVATE) 0.05 % Apply 1 application  topically See admin instructions. Apply a thin layer to affected areas one to three times a day   4   fenofibrate  160 MG tablet Take 160 mg by mouth daily.       hydrOXYzine (ATARAX/VISTARIL) 25 MG tablet Take 25 mg by mouth every 8 (eight) hours as needed for anxiety.       irbesartan (AVAPRO) 300 MG tablet Take 300 mg by mouth daily.       levothyroxine  (SYNTHROID ) 125 MCG tablet Take 125 mcg by mouth daily before breakfast.       magnesium oxide (MAG-OX) 400 (240 Mg) MG tablet Take 400 mg by mouth 2 (two) times daily.       montelukast  (SINGULAIR ) 10 MG tablet Take 10 mg by mouth at bedtime.   4   MOUNJARO  2.5 MG/0.5ML Pen Inject 2.5 mg into the skin  every Wednesday.       pantoprazole  (PROTONIX ) 40 MG tablet Take 1 tablet (40 mg total) by mouth 2 (two) times daily. 60 tablet 0   valACYclovir (VALTREX) 1000 MG tablet Take 2,000 mg by mouth daily as needed (for cold sores- take as directed).        No current facility-administered medications on file prior to visit.      Allergies       Allergies  Allergen Reactions   Chloramphenicol Anaphylaxis and Other (See Comments)      Chloramphenicol is a medication used in the management and treatment of superficial eye infections such as bacterial conjunctivitis, and otitis externa.    Ivp  Dye [Iodinated Contrast Media] Anaphylaxis, Shortness Of Breath and Other (See Comments)      SEVERE convulsions, too     Crestor  [Rosuvastatin  Calcium ] Other (See Comments)      Muscle cramps   Iodine Itching and Other (See Comments)      Topical version only does this   Sulfa Antibiotics Rash      Current Medications, Allergies, Past Medical History, Past Surgical History, Family History and Social History were reviewed in [employer] record.   Review of Systems:   Constitutional: Negative for fever, sweats, chills or weight loss.  Respiratory: Negative for shortness of breath.   Cardiovascular: See HPI. Gastrointestinal: See HPI.  Musculoskeletal: Negative for back pain or muscle aches.  Neurological: Negative for dizziness, headaches or paresthesias.    Physical Exam: BP 122/60   Pulse 77   Ht 5' 2 (1.575 m)   Wt 154 lb (69.9 kg)   BMI 28.17 kg/m     Wt Readings from Last 3 Encounters:  04/21/24 154 lb (69.9 kg)  03/20/24 152 lb 1.9 oz (69 kg)  10/27/23 135 lb (61.2 kg)    General: 78 year old female in no acute distress. Head: Normocephalic and atraumatic. Eyes: No scleral icterus. Conjunctiva pink . Ears: Normal auditory acuity. Mouth: Dentition intact. No ulcers or lesions.  Lungs: Clear throughout to auscultation. Heart: Regular rate and rhythm, no  murmur. Abdomen: Soft, nondistended. Mild epigastric tenderness. No masses or hepatomegaly. Normal bowel sounds x 4 quadrants.  Rectal: Deferred.  Musculoskeletal: Symmetrical with no gross deformities. Extremities: No edema. Neurological: Alert oriented x 4. No focal deficits.  Psychological: Alert and cooperative. Normal mood and affect   Assessment and Recommendations:   78 year old female with a history of GERD, a large hiatal hernia and a tortuous esophagus who was recently admitted to the hospital 03/19/2024 with chest/esophageal pain, nausea and vomiting.  Cardiac workup was unrevealing. CTAP without contrast identified a large hiatal hernia containing the proximal stomach, wall thickening of the distal esophagus and gastroesophageal junction with a small amount of free fluid surrounding the distal esophagus worrisome for esophagitis.  Barium swallow study showed a tortuous esophagus with mild dysmotility and similar appearing distal esophageal stricture at the GE junction, a large hiatal hernia without evidence of an obstruction, perforation or extravasation.  She continues to have daily episodes of esophageal spasms with dysphagia, no further nausea or vomiting.  No abdominal pain. - Dr. Abran to review case to verify if the repeat EGD warranted at this juncture - Consider outpatient follow-up with thoracic surgery to consider benefit versus risk of hiatal hernia surgery - Continue Pantoprazole  40 mg twice daily - Reduce Tums intake - May take Gaviscon 1 tablespoon 3 times daily as needed - Eat 4 small snack size meals daily, avoid eating 3 to 4 hours before bedtime   Chronic normocytic anemia, multifactorial: CKD a contributing factor.  No prior history of upper GI bleed or Cameron lesions in setting of a large hiatal hernia.  Hemoglobin 10.0.  MCV 86.9. - CBC, IBC + ferritin, B12 and folate    History of colon polyps. Her most recent colonoscopy 05/23/2020 which identified one 2 mm  tubular adenomatous polyp removed from the transverse colon and diverticulosis throughout the colon.  - No further colon polyp surveillance colonoscopies were recommended due to age.       Recent H&P as above.  No significant interval clinical change or change in physical exam.  Now for upper endoscopy with  esophageal dilation

## 2024-06-09 ENCOUNTER — Telehealth: Payer: Self-pay | Admitting: *Deleted

## 2024-06-09 NOTE — Telephone Encounter (Signed)
 Attempted post procedure follow up call.  No answer - LVM.

## 2024-06-20 ENCOUNTER — Encounter: Payer: Self-pay | Admitting: Internal Medicine

## 2024-06-21 MED ORDER — PANTOPRAZOLE SODIUM 40 MG PO TBEC: 40.0000 mg | DELAYED_RELEASE_TABLET | Freq: Two times a day (BID) | ORAL | 0 refills | Status: DC

## 2024-06-29 ENCOUNTER — Encounter: Payer: Self-pay | Admitting: Internal Medicine

## 2024-06-29 ENCOUNTER — Ambulatory Visit (AMBULATORY_SURGERY_CENTER)

## 2024-06-29 ENCOUNTER — Telehealth: Payer: Self-pay

## 2024-06-29 VITALS — Ht 62.0 in | Wt 152.0 lb

## 2024-06-29 DIAGNOSIS — K219 Gastro-esophageal reflux disease without esophagitis: Secondary | ICD-10-CM

## 2024-06-29 DIAGNOSIS — R1310 Dysphagia, unspecified: Secondary | ICD-10-CM

## 2024-06-29 DIAGNOSIS — K222 Esophageal obstruction: Secondary | ICD-10-CM

## 2024-06-29 NOTE — Telephone Encounter (Signed)
 Good Morning,   This pt had and EGD on 7/24 with Dr. Abran where she had biopsies taking. Per pt she has not gotten any updates on the path results. I see where it is noticed that the biopsies were taken but I do not see any results in her chart. She is requesting someone reach out and update her.   Thank you

## 2024-06-29 NOTE — Progress Notes (Signed)
 No egg or soy allergy known to patient  No issues known to pt with past sedation with any surgeries or procedures Patient denies ever being told they had issues or difficulty with intubation  No FH of Malignant Hyperthermia Pt is not on diet pills Pt is not on  home 02  Pt is not on blood thinners  No A fib or A flutter Have any cardiac testing pending-- no  LOA: independent    Patient's chart reviewed by Rogena Class CNRA prior to previsit and patient appropriate for the [facility].  Previsit completed and red dot placed by patient's name on their procedure day (on provider's schedule).     PV completed with patient. Prep instructions sent via mychart and home address.

## 2024-06-29 NOTE — Telephone Encounter (Signed)
 Kelly Mooney, can you look into the missing path on this patient from procedure on 06/08/24 with Dr. Abran.  Patient is calling for the results

## 2024-06-30 NOTE — Telephone Encounter (Signed)
 Left message for patient to call back

## 2024-07-03 NOTE — Telephone Encounter (Signed)
 Patient is advised that no biopsies were obtained at last endoscopy, rather Dr Abran used biopsy forceps to try to break up the fibrous ring found in the esophagus. She verbalizes understanding and is already scheduled for repeat endoscopy with kenalog on 07/11/24.

## 2024-07-11 ENCOUNTER — Encounter: Payer: Self-pay | Admitting: Internal Medicine

## 2024-07-11 ENCOUNTER — Ambulatory Visit: Admitting: Internal Medicine

## 2024-07-11 VITALS — BP 141/54 | HR 76 | Temp 97.8°F | Resp 14 | Ht 62.0 in | Wt 154.0 lb

## 2024-07-11 DIAGNOSIS — R1310 Dysphagia, unspecified: Secondary | ICD-10-CM

## 2024-07-11 DIAGNOSIS — K259 Gastric ulcer, unspecified as acute or chronic, without hemorrhage or perforation: Secondary | ICD-10-CM

## 2024-07-11 DIAGNOSIS — K222 Esophageal obstruction: Secondary | ICD-10-CM | POA: Diagnosis not present

## 2024-07-11 DIAGNOSIS — Q399 Congenital malformation of esophagus, unspecified: Secondary | ICD-10-CM | POA: Diagnosis not present

## 2024-07-11 DIAGNOSIS — K219 Gastro-esophageal reflux disease without esophagitis: Secondary | ICD-10-CM | POA: Diagnosis not present

## 2024-07-11 DIAGNOSIS — K229 Disease of esophagus, unspecified: Secondary | ICD-10-CM | POA: Diagnosis not present

## 2024-07-11 DIAGNOSIS — K449 Diaphragmatic hernia without obstruction or gangrene: Secondary | ICD-10-CM

## 2024-07-11 MED ORDER — SODIUM CHLORIDE 0.9 % IV SOLN: 500.0000 mL | Freq: Once | INTRAVENOUS | Status: DC

## 2024-07-11 NOTE — Op Note (Signed)
 [facility] Patient Name: Kelly Mooney Procedure Date: 07/11/2024 12:43 PM MRN: [id number] Endoscopist: Norleen SAILOR. Abran , MD, [id number] Age: 78 Referring MD:  Date of Birth: [birth year] Gender: Female Account #: [id number] Procedure:                Upper GI endoscopy with subinjection, biopsies,                            dilation?"20 mm max Indications:              Dysphagia. Recalcitrant. Underwent EGD with                            dilation 4 weeks ago. Medicines:                Monitored Anesthesia Care Procedure:                Pre-Anesthesia Assessment:                           - Prior to the procedure, a History and Physical                            was performed, and patient medications and                            allergies were reviewed. The patient's tolerance of                            previous anesthesia was also reviewed. The risks                            and benefits of the procedure and the sedation                            options and risks were discussed with the patient.                            All questions were answered, and informed consent                            was obtained. Prior Anticoagulants: The patient has                            taken no anticoagulant or antiplatelet agents. ASA                            Grade Assessment: II - A patient with mild systemic                            disease. After reviewing the risks and benefits,                            the patient was deemed in satisfactory condition to  undergo the procedure.                           After obtaining informed consent, the endoscope was                            passed under direct vision. Throughout the                            procedure, the patient's blood pressure, pulse, and                            oxygen  saturations were monitored continuously. The                            GIF [id number] #[id number] was introduced  through the                            mouth, and advanced to the second part of duodenum.                            The upper GI endoscopy was accomplished without                            difficulty. The patient tolerated the procedure                            well. Scope In: Scope Out: Findings:                 The esophagus was somewhat tortuous and                            foreshortened. Acute angulation at the level of the                            gastroesophageal junction which was located at 30                            cm from the incisors. No acute inflammation.                           One benign-appearing, intrinsic moderate stenosis                            was found 30 cm from the incisors. This stenosis                            measured 1.4 cm (inner diameter). The stricture was                            injected with triamcinolone. 40 mg at each of 4                            quadrants. The stricture  was then biopsy disrupted                            multiple portions. Tissue sent for pathology.                            Finally, a sequential balloon measuring 18-19-20 mm                            was used to dilate the stricture. A maximal                            diameter of 20 mm utilized.                           The stomach revealed a large hiatal hernia                            measuring 5 to 6 cm. A few Cameron erosions noted.                            The stomach revealed no other significant                            abnormalities.                           The examined duodenum was normal.                           The cardia and gastric fundus were normal on                            retroflexion. Complications:            No immediate complications. Estimated Blood Loss:     Estimated blood loss: none. Impression:               1. Esophageal stricture as described. Injected with                            triamcinolone, biopsy,  and balloon dilated                           2. Large hiatal hernia                           3. Otherwise unremarkable EGD Recommendation:           - Patient has a contact number available for                            emergencies. The signs and symptoms of potential                            delayed complications were discussed with the  patient. Return to normal activities tomorrow.                            Written discharge instructions were provided to the                            patient.                           - Post dilation diet.                           - Continue present medications.                           - Await pathology results.                           - Office follow-up with Dr. Abran in 6 to 8 weeks Norleen SAILOR. Abran, MD 07/11/2024 2:05:54 PM This report has been signed electronically.

## 2024-07-11 NOTE — Progress Notes (Signed)
 HISTORY OF PRESENT ILLNESS:  Kelly Mooney is a 78 y.o. female with GERD esophageal stricture.  Dilated to 20 mm with balloon 1 month ago.  Now for repeat EGD with dilation and as well as stricture injection with Kenalog and disruption with biopsy forceps.  REVIEW OF SYSTEMS:  All non-GI ROS negative except for  Past Medical History:  Diagnosis Date   Agatston coronary artery calcium  score greater than 400    462 on coronary calcium  score 09/2021   Allergy    Anemia    Arthritis    Asthma    since age 79   Carotid artery stenosis    1-39% bilateral carotid stenosis by dopplers 04/2023   Cataract    Chronic kidney disease    Colon polyps    Complication of anesthesia    slow to wake up    COPD (chronic obstructive pulmonary disease) (HCC)    COVID-19 09/2019   Diabetes mellitus    Diverticulosis    Dyspnea    with exertoin due to asthma    Esophageal stricture    Family history of adverse reaction to anesthesia    mother , daughter and son slow to wake up and  problems with N/V    GERD (gastroesophageal reflux disease)    Hemorrhoids    Hiatal hernia    History of kidney stones    Hypercholesteremia    Hypertension    Hypothyroidism    IBS (irritable bowel syndrome)    Paraesophageal hernia    PONV (postoperative nausea and vomiting)    Stroke (HCC)    no deficits    Tubular adenoma of colon     Past Surgical History:  Procedure Laterality Date   ABDOMINAL HYSTERECTOMY     BUNIONECTOMY Right 10/2012   CATARACT EXTRACTION, BILATERAL     CHOLECYSTECTOMY  1980   ESOPHAGEAL MANOMETRY N/A 07/15/2015   Procedure: ESOPHAGEAL MANOMETRY (EM);  Surgeon: Kelly LOISE Kiang, MD;  Location: [facility];  Service: Endoscopy;  Laterality: N/A;   HEMORRHOID SURGERY N/A 08/01/2020   Procedure: HEMORRHOIDECTOMY, HEMORRHOIDAL LIGATION HINTON ANORECTAL EXAMINATION UNDER ANESTHESIA;  Surgeon: Kelly Standing, MD;  Location: [facility];  Service: General;  Laterality: N/A;  LOCAL   PARTIAL  HYSTERECTOMY     RHINOPLASTY     nasal polyps removed Kelly Mooney    Social History Kelly Mooney  reports that she quit smoking about 27 years ago. Her smoking use included cigarettes. She started smoking about 37 years ago. She has a 15 pack-year smoking history. She has never used smokeless tobacco. She reports that she does not drink alcohol  and does not use drugs.  family history includes Asthma in her brother and mother; Diabetes Mellitus I in her mother; Heart attack in her brother, brother, and father; Hyperlipidemia in her mother.  Allergies  Allergen Reactions   Chloramphenicol Anaphylaxis and Other (See Comments)    Chloramphenicol is a medication used in the management and treatment of superficial eye infections such as bacterial conjunctivitis, and otitis externa.   Iodinated Contrast Media Anaphylaxis, Other (See Comments) and Shortness Of Breath    SEVERE convulsions, too   Rosuvastatin  Swelling    Swelling of the ankles   Crestor  [Rosuvastatin  Calcium ] Other (See Comments)    Muscle cramps   Iodine Itching and Other (See Comments)    Topical version only does this   Sulfa Antibiotics Rash       PHYSICAL EXAMINATION: Vital signs: BP (!) 145/58  Pulse 75   Temp 97.8 F (36.6 C) (Temporal)   Resp 19   Ht 5' 2 (1.575 m)   Wt 154 lb (69.9 kg)   SpO2 96%   BMI 28.17 kg/m  General: Well-developed, well-nourished, no acute distress HEENT: Sclerae are anicteric, conjunctiva pink. Oral mucosa intact Lungs: Clear Heart: Regular Abdomen: soft, nontender, nondistended, no obvious ascites, no peritoneal signs, normal bowel sounds. No organomegaly. Extremities: No edema Psychiatric: alert and oriented x3. Cooperative     ASSESSMENT:  Recalcitrant stricture   PLAN:   EGD with Kenalog injection, biopsy, and dilation.

## 2024-07-11 NOTE — Progress Notes (Signed)
 Called to room to assist during endoscopic procedure.  Patient ID and intended procedure confirmed with present staff. Received instructions for my participation in the procedure from the performing physician.  Kenalog 40 mg / ml injected at esophogeal stricture.  1 ml injected in each quadrant as directed by Dr. Abran.

## 2024-07-11 NOTE — Patient Instructions (Addendum)
 Post dilation diet. Nothing by mouth for 1 hr, clear liquids for 1 hr, soft diet for 24 hours, then resume previous diet.  Continue present medications.  Awaiting pathology results.  Handouts provided on esophageal stricture and hiatal hernia.  Office will call you to schedule follow up appointment.   YOU HAD AN ENDOSCOPIC PROCEDURE TODAY AT THE [facility]:   Refer to the procedure report that was given to you for any specific questions about what was found during the examination.  If the procedure report does not answer your questions, please call your gastroenterologist to clarify.  If you requested that your care partner not be given the details of your procedure findings, then the procedure report has been included in a sealed envelope for you to review at your convenience later.  YOU SHOULD EXPECT: Some feelings of bloating in the abdomen. Passage of more gas than usual.  Walking can help get rid of the air that was put into your GI tract during the procedure and reduce the bloating. If you had a lower endoscopy (such as a colonoscopy or flexible sigmoidoscopy) you may notice spotting of blood in your stool or on the toilet paper. If you underwent a bowel prep for your procedure, you may not have a normal bowel movement for a few days.  Please Note:  You might notice some irritation and congestion in your nose or some drainage.  This is from the oxygen  used during your procedure.  There is no need for concern and it should clear up in a day or so.  SYMPTOMS TO REPORT IMMEDIATELY:  Following upper endoscopy (EGD)  Vomiting of blood or coffee ground material  New chest pain or pain under the shoulder blades  Painful or persistently difficult swallowing  New shortness of breath  Fever of 100F or higher  Black, tarry-looking stools  For urgent or emergent issues, a gastroenterologist can be reached at any hour by calling [phone]. Do not use MyChart messaging for urgent  concerns.    DIET:  We do recommend a small meal at first, but then you may proceed to your regular diet.  Drink plenty of fluids but you should avoid alcoholic beverages for 24 hours.  ACTIVITY:  You should plan to take it easy for the rest of today and you should NOT DRIVE or use heavy machinery until tomorrow (because of the sedation medicines used during the test).    FOLLOW UP: Our staff will call the number listed on your records the next business day following your procedure.  We will call around 7:15- 8:00 am to check on you and address any questions or concerns that you may have regarding the information given to you following your procedure. If we do not reach you, we will leave a message.     If any biopsies were taken you will be contacted by phone or by letter within the next 1-3 weeks.  Please call us  at [phone] if you have not heard about the biopsies in 3 weeks.    SIGNATURES/CONFIDENTIALITY: You and/or your care partner have signed paperwork which will be entered into your electronic medical record.  These signatures attest to the fact that that the information above on your After Visit Summary has been reviewed and is understood.  Full responsibility of the confidentiality of this discharge information lies with you and/or your care-partner.

## 2024-07-11 NOTE — Progress Notes (Signed)
 Report to PACU, RN, vss, BBS= Clear.

## 2024-07-12 ENCOUNTER — Telehealth: Payer: Self-pay | Admitting: *Deleted

## 2024-07-12 NOTE — Telephone Encounter (Signed)
 No answer on follow up call. Left message.

## 2024-07-14 ENCOUNTER — Ambulatory Visit: Payer: Self-pay | Admitting: Internal Medicine

## 2024-07-14 LAB — SURGICAL PATHOLOGY

## 2024-08-18 ENCOUNTER — Encounter: Payer: Self-pay | Admitting: Internal Medicine

## 2024-08-18 ENCOUNTER — Ambulatory Visit: Admitting: Internal Medicine

## 2024-08-18 VITALS — BP 130/62 | HR 76 | Ht 62.0 in | Wt 151.0 lb

## 2024-08-18 DIAGNOSIS — Z860101 Personal history of adenomatous and serrated colon polyps: Secondary | ICD-10-CM | POA: Diagnosis not present

## 2024-08-18 DIAGNOSIS — R1310 Dysphagia, unspecified: Secondary | ICD-10-CM

## 2024-08-18 DIAGNOSIS — K449 Diaphragmatic hernia without obstruction or gangrene: Secondary | ICD-10-CM

## 2024-08-18 DIAGNOSIS — K219 Gastro-esophageal reflux disease without esophagitis: Secondary | ICD-10-CM | POA: Diagnosis not present

## 2024-08-18 DIAGNOSIS — K222 Esophageal obstruction: Secondary | ICD-10-CM | POA: Diagnosis not present

## 2024-08-18 MED ORDER — PANTOPRAZOLE SODIUM 40 MG PO TBEC: 40.0000 mg | DELAYED_RELEASE_TABLET | Freq: Two times a day (BID) | ORAL | 3 refills | Status: AC

## 2024-08-18 NOTE — Progress Notes (Signed)
 HISTORY OF PRESENT ILLNESS:  Kelly Mooney is a 78 y.o. female with GERD complicated by peptic stricture, paraesophageal hernia, and adenomatous colon polyps.  General medical problems as listed below.  She presents today for post endoscopy follow-up.  The patient underwent upper endoscopy with esophageal dilation in July.  This did not help her dysphagia.  She underwent repeat esophageal dilation July 11, 2024.  At that time her distal stricture was injected with triamcinolone.  In addition, biopsy disrupted and then balloon dilated to 20 mm.  She has continued on pantoprazole  twice daily for GERD and follows up at this time.  Patient is pleased to report her dysphagia is 95% improved.  She was delighted.  No further issues with meal associated chest pain or vomiting.  No new complaints.  REVIEW OF SYSTEMS:  All non-GI ROS negative. Past Medical History:  Diagnosis Date   Agatston coronary artery calcium  score greater than 400    462 on coronary calcium  score 09/2021   Allergy    Anemia    Arthritis    Asthma    since age 57   Carotid artery stenosis    1-39% bilateral carotid stenosis by dopplers 04/2023   Cataract    Chronic kidney disease    Colon polyps    Complication of anesthesia    slow to wake up    COPD (chronic obstructive pulmonary disease) (HCC)    COVID-19 09/2019   Diabetes mellitus    Diverticulosis    Dyspnea    with exertoin due to asthma    Esophageal stricture    Family history of adverse reaction to anesthesia    mother , daughter and son slow to wake up and  problems with N/V    GERD (gastroesophageal reflux disease)    Hemorrhoids    Hiatal hernia    History of kidney stones    Hypercholesteremia    Hypertension    Hypothyroidism    IBS (irritable bowel syndrome)    Paraesophageal hernia    PONV (postoperative nausea and vomiting)    Stroke (HCC)    no deficits    Tubular adenoma of colon     Past Surgical History:  Procedure Laterality  Date   ABDOMINAL HYSTERECTOMY     BUNIONECTOMY Right 10/2012   CATARACT EXTRACTION, BILATERAL     CHOLECYSTECTOMY  1980   ESOPHAGEAL MANOMETRY N/A 07/15/2015   Procedure: ESOPHAGEAL MANOMETRY (EM);  Surgeon: Norleen LOISE Kiang, MD;  Location: [facility];  Service: Endoscopy;  Laterality: N/A;   HEMORRHOID SURGERY N/A 08/01/2020   Procedure: HEMORRHOIDECTOMY, HEMORRHOIDAL LIGATION HINTON ANORECTAL EXAMINATION UNDER ANESTHESIA;  Surgeon: Sheldon Standing, MD;  Location: [facility];  Service: General;  Laterality: N/A;  LOCAL   PARTIAL HYSTERECTOMY     RHINOPLASTY     nasal polyps removed dr. alvera    Social History Kelly Mooney  reports that she quit smoking about 27 years ago. Her smoking use included cigarettes. She started smoking about 37 years ago. She has a 15 pack-year smoking history. She has never used smokeless tobacco. She reports that she does not drink alcohol  and does not use drugs.  family history includes Asthma in her brother and mother; Diabetes Mellitus I in her mother; Heart attack in her brother, brother, and father; Hyperlipidemia in her mother.  Allergies  Allergen Reactions   Iodinated Contrast Media Anaphylaxis, Other (See Comments) and Shortness Of Breath    SEVERE convulsions, too   Rosuvastatin  Swelling  Swelling of the ankles   Crestor  [Rosuvastatin  Calcium ] Other (See Comments)    Muscle cramps   Iodine Itching and Other (See Comments)    Topical version only does this   Sulfa Antibiotics Rash       PHYSICAL EXAMINATION: Vital signs: BP 130/62   Pulse 76   Ht 5' 2 (1.575 m)   Wt 151 lb (68.5 kg)   BMI 27.62 kg/m  General: Well-developed, well-nourished, no acute distress HEENT: Sclerae are anicteric, conjunctiva pink. Oral mucosa intact Lungs: Clear Heart: Regular Abdomen: soft, nontender, nondistended, no obvious ascites, no peritoneal signs, normal bowel sounds. No organomegaly. Extremities: No edema Psychiatric: alert and oriented x3.  Cooperative     ASSESSMENT:  1.  GERD complicated by recalcitrant peptic stricture successfully treated with triamcinolone injection, biopsy disruption, and balloon dilation. 2.  Small paraesophageal hernia 3.  History of adenomatous polyps.  Aged out of surveillance   PLAN:  1.  Reflux precautions 2.  Continue pantoprazole  twice daily 3.  Prescription refilled.  Medication risk reviewed 4.  Routine office follow-up 6 months.  Contact the office in the interim for any questions or problems A total time of 25 minutes was spent preparing to see the patient, obtaining interval history, performing medically appropriate physical examination, counseling and educating the patient regarding the above listed issues prescribed medication, arranging follow-up, and documenting clinical information in the health record

## 2024-08-18 NOTE — Patient Instructions (Signed)
 We have sent the following medications to your pharmacy for you to pick up at your convenience:  Protonix .  Please follow up in 6 months.  _______________________________________________________  If your blood pressure at your visit was 140/90 or greater, please contact your primary care physician to follow up on this.  _______________________________________________________  If you are age 79 or older, your body mass index should be between 23-30. Your Body mass index is 27.62 kg/m. If this is out of the aforementioned range listed, please consider follow up with your Primary Care Provider.  If you are age 59 or younger, your body mass index should be between 19-25. Your Body mass index is 27.62 kg/m. If this is out of the aformentioned range listed, please consider follow up with your Primary Care Provider.   ________________________________________________________  The [facility] providers would like to encourage you to use MYCHART to communicate with providers for non-urgent requests or questions.  Due to long hold times on the telephone, sending your provider a message by [facility] may be a faster and more efficient way to get a response.  Please allow 48 business hours for a response.  Please remember that this is for non-urgent requests.  _______________________________________________________  [facility] is using a team-based approach to care.  Your team is made up of your doctor and two to three APPS. Our APPS (Nurse Practitioners and Physician Assistants) work with your physician to ensure care continuity for you. They are fully qualified to address your health concerns and develop a treatment plan. They communicate directly with your gastroenterologist to care for you. Seeing the Advanced Practice Practitioners on your physician's team can help you by facilitating care more promptly, often allowing for earlier appointments, access to diagnostic testing, procedures, and  other specialty referrals.
